# Patient Record
Sex: Female | Born: 1950
Health system: Southern US, Community
[De-identification: ages and names within clinical notes are randomized; demographics above are authoritative.]

## PROBLEM LIST (undated history)

## (undated) DIAGNOSIS — F32A Depression, unspecified: Secondary | ICD-10-CM

## (undated) DIAGNOSIS — E079 Disorder of thyroid, unspecified: Secondary | ICD-10-CM

## (undated) DIAGNOSIS — I2510 Atherosclerotic heart disease of native coronary artery without angina pectoris: Secondary | ICD-10-CM

## (undated) DIAGNOSIS — F329 Major depressive disorder, single episode, unspecified: Secondary | ICD-10-CM

## (undated) DIAGNOSIS — E039 Hypothyroidism, unspecified: Secondary | ICD-10-CM

## (undated) DIAGNOSIS — F419 Anxiety disorder, unspecified: Secondary | ICD-10-CM

## (undated) HISTORY — DX: Atherosclerotic heart disease of native coronary artery without angina pectoris: I25.10

## (undated) HISTORY — PX: BUNIONECTOMY: SHX129

## (undated) HISTORY — PX: BACK SURGERY: SHX140

---

## 2000-04-26 ENCOUNTER — Other Ambulatory Visit: Admission: RE | Admit: 2000-04-26 | Discharge: 2000-04-26 | Payer: Self-pay | Admitting: Gynecology

## 2000-06-06 ENCOUNTER — Ambulatory Visit (HOSPITAL_COMMUNITY): Admission: RE | Admit: 2000-06-06 | Discharge: 2000-06-06 | Payer: Self-pay | Admitting: Gynecology

## 2000-06-06 ENCOUNTER — Encounter (INDEPENDENT_AMBULATORY_CARE_PROVIDER_SITE_OTHER): Payer: Self-pay | Admitting: Specialist

## 2001-05-17 ENCOUNTER — Other Ambulatory Visit: Admission: RE | Admit: 2001-05-17 | Discharge: 2001-05-17 | Payer: Self-pay | Admitting: Gynecology

## 2001-12-08 ENCOUNTER — Encounter: Payer: Self-pay | Admitting: Neurosurgery

## 2001-12-08 ENCOUNTER — Ambulatory Visit (HOSPITAL_COMMUNITY): Admission: RE | Admit: 2001-12-08 | Discharge: 2001-12-09 | Payer: Self-pay | Admitting: Neurosurgery

## 2002-05-21 ENCOUNTER — Other Ambulatory Visit: Admission: RE | Admit: 2002-05-21 | Discharge: 2002-05-21 | Payer: Self-pay | Admitting: Gynecology

## 2003-05-24 ENCOUNTER — Other Ambulatory Visit: Admission: RE | Admit: 2003-05-24 | Discharge: 2003-05-24 | Payer: Self-pay | Admitting: Gynecology

## 2003-10-25 DIAGNOSIS — K5730 Diverticulosis of large intestine without perforation or abscess without bleeding: Secondary | ICD-10-CM | POA: Insufficient documentation

## 2004-06-02 ENCOUNTER — Other Ambulatory Visit: Admission: RE | Admit: 2004-06-02 | Discharge: 2004-06-02 | Payer: Self-pay | Admitting: Gynecology

## 2005-06-07 ENCOUNTER — Other Ambulatory Visit: Admission: RE | Admit: 2005-06-07 | Discharge: 2005-06-07 | Payer: Self-pay | Admitting: Gynecology

## 2006-06-09 ENCOUNTER — Other Ambulatory Visit: Admission: RE | Admit: 2006-06-09 | Discharge: 2006-06-09 | Payer: Self-pay | Admitting: Gynecology

## 2007-05-05 ENCOUNTER — Encounter: Admission: RE | Admit: 2007-05-05 | Discharge: 2007-05-05 | Payer: Self-pay | Admitting: Orthopedic Surgery

## 2007-06-23 ENCOUNTER — Other Ambulatory Visit: Admission: RE | Admit: 2007-06-23 | Discharge: 2007-06-23 | Payer: Self-pay | Admitting: Gynecology

## 2007-12-07 ENCOUNTER — Observation Stay (HOSPITAL_COMMUNITY): Admission: EM | Admit: 2007-12-07 | Discharge: 2007-12-08 | Payer: Self-pay | Admitting: Emergency Medicine

## 2007-12-20 ENCOUNTER — Ambulatory Visit: Payer: Self-pay | Admitting: Gastroenterology

## 2007-12-21 ENCOUNTER — Ambulatory Visit: Payer: Self-pay | Admitting: Gastroenterology

## 2007-12-21 ENCOUNTER — Encounter: Payer: Self-pay | Admitting: Gastroenterology

## 2007-12-21 DIAGNOSIS — K2990 Gastroduodenitis, unspecified, without bleeding: Secondary | ICD-10-CM

## 2007-12-21 DIAGNOSIS — K2970 Gastritis, unspecified, without bleeding: Secondary | ICD-10-CM | POA: Insufficient documentation

## 2007-12-22 ENCOUNTER — Ambulatory Visit (HOSPITAL_COMMUNITY): Payer: Self-pay | Admitting: Marriage and Family Therapist

## 2008-01-31 ENCOUNTER — Encounter: Payer: Self-pay | Admitting: Gastroenterology

## 2008-01-31 DIAGNOSIS — J45909 Unspecified asthma, uncomplicated: Secondary | ICD-10-CM | POA: Insufficient documentation

## 2008-01-31 DIAGNOSIS — E039 Hypothyroidism, unspecified: Secondary | ICD-10-CM | POA: Insufficient documentation

## 2008-01-31 DIAGNOSIS — M129 Arthropathy, unspecified: Secondary | ICD-10-CM | POA: Insufficient documentation

## 2008-01-31 DIAGNOSIS — F329 Major depressive disorder, single episode, unspecified: Secondary | ICD-10-CM | POA: Insufficient documentation

## 2008-03-28 ENCOUNTER — Telehealth: Payer: Self-pay | Admitting: Internal Medicine

## 2008-04-12 ENCOUNTER — Encounter: Admission: RE | Admit: 2008-04-12 | Discharge: 2008-04-12 | Payer: Self-pay | Admitting: Cardiology

## 2008-05-31 ENCOUNTER — Encounter: Admission: RE | Admit: 2008-05-31 | Discharge: 2008-08-29 | Payer: Self-pay | Admitting: Internal Medicine

## 2008-07-11 ENCOUNTER — Other Ambulatory Visit: Admission: RE | Admit: 2008-07-11 | Discharge: 2008-07-11 | Payer: Self-pay | Admitting: Gynecology

## 2008-07-11 ENCOUNTER — Ambulatory Visit: Payer: Self-pay | Admitting: Gynecology

## 2008-07-11 ENCOUNTER — Encounter: Payer: Self-pay | Admitting: Gynecology

## 2009-07-22 ENCOUNTER — Ambulatory Visit: Payer: Self-pay | Admitting: Gynecology

## 2009-07-22 ENCOUNTER — Other Ambulatory Visit: Admission: RE | Admit: 2009-07-22 | Discharge: 2009-07-22 | Payer: Self-pay | Admitting: Gynecology

## 2010-05-06 ENCOUNTER — Ambulatory Visit: Payer: Self-pay | Admitting: Gynecology

## 2010-08-05 ENCOUNTER — Emergency Department (HOSPITAL_COMMUNITY)
Admission: EM | Admit: 2010-08-05 | Discharge: 2010-08-05 | Payer: Self-pay | Source: Home / Self Care | Admitting: Emergency Medicine

## 2010-11-17 LAB — BASIC METABOLIC PANEL
BUN: 10 mg/dL (ref 6–23)
CO2: 25 mEq/L (ref 19–32)
Calcium: 9.2 mg/dL (ref 8.4–10.5)
Chloride: 107 mEq/L (ref 96–112)
Creatinine, Ser: 0.64 mg/dL (ref 0.4–1.2)
GFR calc Af Amer: >60 mL/min (ref >60)

## 2010-11-17 LAB — CBC
MCH: 32.5 pg (ref 26.0–34.0)
MCHC: 34.5 g/dL (ref 30.0–36.0)
MCV: 94.2 fL (ref 78.0–100.0)
Platelets: 254 10*3/uL (ref 150–400)
RBC: 4.62 MIL/uL (ref 3.87–5.11)
RDW: 13 % (ref 11.5–15.5)

## 2010-11-17 LAB — DIFFERENTIAL
Basophils Absolute: 0 10*3/uL (ref 0.0–0.1)
Basophils Relative: 0 % (ref 0–1)
Eosinophils Absolute: 0.1 10*3/uL (ref 0.0–0.7)
Eosinophils Relative: 1 % (ref 0–5)
Neutrophils Relative %: 57 % (ref 43–77)

## 2010-11-17 LAB — POCT CARDIAC MARKERS
Myoglobin, poc: 39.5 ng/mL (ref 12–200)
Troponin i, poc: <0.05 ng/mL (ref 0.00–0.09)

## 2011-01-19 NOTE — Consult Note (Signed)
NAMEJOVANI, COLQUHOUN               ACCOUNT NO.:  [id number]   MEDICAL RECORD NO.:  [id number]          PATIENT TYPE:  INP   LOCATION:  4733                         FACILITY:  [facility]   PHYSICIAN:  Georga Hacking, M.D.DATE OF BIRTH:  Feb 04, 1951   DATE OF CONSULTATION:  12/07/2007  DATE OF DISCHARGE:                                 CONSULTATION   Thank you for asking me to see this 60 year old female for evaluation of  chest discomfort.  The patient has a history of chronic low back pain  with sciatica and previous epidural steroid injections.  She has lost 30  pounds of weight in the past year and has been diagnosed with  hypothyroidism as well as osteoporosis.  She has what is considered to  be a reflex sympathetic dystrophy involving her feet.  She has been  undergoing memory suppression counseling and during a counseling session  yesterday, which was fairly intense, developed mid sternal discomfort.  The discomfort was described as tightness and heaviness and was  localized to the mid sternal area.  She got up afterwards and left and  had to sit in her car and later fell asleep in her car.  She eventually  was taken to Dr. Tiburcio Pea'  office where an EKG was normal and a GI  cocktail was administered.  At some point, she was eventually  administered nitroglycerin resulted in relief of the discomfort.  She  has complained of feeling very tired and had also felt as if her hands  and feet were cramping and her friend who is with her noted that her  speech did not appear to be her usual voice.  She since has had a normal  EKG and several sets of cardiac enzymes have returned normal.  She has  no previous history of anginal type pain and normally walks about three  miles per day.  Past history is negative for hypertension,  hyperlipidemia for GI problems.  She has a history of hypothyroidism,  osteoporosis.   PAST SURGICAL HISTORY:  1. Lumbar disk operation.  2. Bunionectomy with  previous fractures of foot.  3. Two C-sections.   ALLERGIES:  She has seasonal allergies but no medication allergies   CURRENT MEDICATIONS:  1. Synthroid 0.1375 mg daily.  2. Fosamax daily.   FAMILY HISTORY:  Mother is 74 and is healthy.  Father died at age 35 and  had a myocardial infarction.  One brother and two sisters are healthy.   SOCIAL HISTORY:  She is a Futures trader.  There is some situational stress  involving her husband.  She is a nonsmoker.  There is no history of  alcohol, cocaine or drug abuse.  She has been under significant  situational stress with some of her children.  She attends [employer].   REVIEW OF SYSTEMS:  Notes a 30-pound weight loss in the past year  attributed to her thyroid.  She is being checked for glaucoma but does  not have any eyedrops.  She has a history of sinus headaches and remote  history of migraine headaches.  She has a  remote history of asthma, but  has no shortness of breath, cough, wheezing or hemoptysis.  She has no  history of palpitations or syncope.  She has a remote history of  diverticulitis and takes a laxative everyday, Citrucel.  In addition,  she feels gassy and feels symptoms in her lower abdomen now.  She has a  remote history of urinary tract infections.  She has significant chronic  low back pain.  No history of stroke or TIA.  Other than as noted above,  the remainder of the review of systems is unremarkable.   PHYSICAL EXAMINATION:  GENERAL APPEARANCE:  She is a tall, thin female  who appeared depressed.  VITAL SIGNS:  Blood pressure is currently 110/80, pulse was 72.  SKIN:  Warm and dry.  HEENT:  EOMI.  C & S clear.  Fundi not examined.  Pharynx negative.  NECK:  Supple without masses, JVD, thyromegaly or bruits.  LUNGS:  Clear.  CARDIAC:  Normal S1 and S2, no S3 or murmur.  ABDOMEN:  Soft and nontender.  No edema.  EXTREMITIES:  Femoral and distal pulses 2+.   EKG is normal.   LABORATORY DATA:  Appeared  normal.   IMPRESSION:  1. Prolonged episode of chest discomfort yesterday with relief with      nitroglycerin with normal EKG and negative cardiac enzymes.  The      clinical context occurring in the setting of intense emotional      stress and counseling could suggest anxiety or stress as an      etiology.  2. Hypokalemia on lab.  There is no apparent etiology for this.  She      did note some recent diarrhea and this will need to be repleted.      This could be a cause of some of the cramping that she was feeling.  3. Hypothyroidism, under treatment.  4. Chronic low back pain.   RECOMMENDATIONS:  I think that she is okay to go home.  I would treat  her with over-the-counter Prilosec 20 mg and I would replete her  potassium prior to discharge.  She should rest over the weekend.  I have  asked her to call the office next week to have a treadmill test.  We  will also do a fasting lipid panel at the same time.      Georga Hacking, M.D.  Electronically Signed     WST/MEDQ  D:  12/08/2007  T:  12/08/2007  Job:  [id number]   cc:   Dario Guardian, M.D.  Michiel Cowboy, MD

## 2011-01-19 NOTE — H&P (Signed)
[name], [name]               ACCOUNT NO.:  [id number]   MEDICAL RECORD NO.:  [id number]          PATIENT TYPE:  EMS   LOCATION:  MAJO                         FACILITY:  [facility]   PHYSICIAN:  Hollice Espy, M.D.DATE OF BIRTH:  30-Jul-1951   DATE OF ADMISSION:  12/07/2007  DATE OF DISCHARGE:                              HISTORY & PHYSICAL   PRIMARY CARE PHYSICIAN:  Dr. Leonides Sake.   CHIEF COMPLAINT:  Chest pain.   HISTORY OF PRESENT ILLNESS:  The patient is a 60 year old white female  with past medical history of hypothyroidism who has been undergoing a  repressed memory counseling session for the last few weeks who today,  while during a session, started having sudden onset severe chest pain.  She describes her chest pain as a heavy pressure midsternal.  Also says  there is a bubble in her chest.  She felt like she could not catch her  breath.  She became quite concerned about this. The symptoms did not  resolve. She went to her car and closed her eyes for a little bit.  She  ended up falling asleep and when she woke up, her symptoms were  unchanged and she came the emergency room. Her lab work including EKG,  chest x-ray and cardiac markers were all unremarkable.  The patient's  symptoms continued to persist and it was felt best that she come into  the hospital for further evaluation.  Currently she is doing okay.  She  complains of continued is chest pressure and associated shortness of  breath.  She denies any headaches, vision changes or dysphagia.  No  palpitations.  No wheezing or coughing.  No abdominal pain.  No  hematuria, dysuria, constipation, diarrhea, focal extremity numbness,  weakness or pain.  Review of systems otherwise negative.   The patient's past medical history is hypothyroidism and osteoporosis.   MEDICATIONS:  She is on Synthroid, Fosamax.   ALLERGIES:  She has NO KNOWN DRUG ALLERGIES.   SOCIAL HISTORY:  Denies any tobacco, alcohol or drug  use.   FAMILY HISTORY:  Mom had heart attack at age 58.   PHYSICAL EXAMINATION:  VITAL SIGNS:  The patient's vitals on admission  showed temperature 98.2, heart rate 69, blood pressure 179/84 on  admission, now down to 131/79, respirations 22, oxygen saturation 100%  on room air.  Only medication the patient was given was aspirin as her  blood pressure came down on itz own.  GENERAL:  She is alert and oriented x3, in some mild distress secondary  to anxiety.  HEENT:  Normocephalic atraumatic.  Mucous membranes are moist.  She has  no carotid bruits.  HEART:  Regular rate and rhythm.  S1 and S2.  Note that when I listened  to her chest with my stethoscope, this seemed to reproduce some of the  pain in the midsternal chest.  LUNGS:  Clear to auscultation bilaterally.  ABDOMEN:  Soft, nontender, nondistended.  Positive bowel sounds.  EXTREMITIES:  No clubbing, cyanosis or edema.   LABORATORY DATA:  Her chest x-ray and EKG are both unremarkable.  White  count 5.9, hemoglobin 15.1, hematocrit 44, MCV 94, platelet count  288,000.  Sodium 141, potassium 3.7, chloride 105, bicarb not done.  BUN  14, creatinine 0.9, glucose 95.  CPK 55.1, MB 1.1, troponin-I 0.05.  of  0.05.  D-dimer which is pending.   ASSESSMENT/PLAN:  1. Chest pain, sudden onset, ongoing pressure/pain with associated      shortness of breath. I  suspect that this is anxiety.  However,      will check a D-dimer to rule out pulmonary embolus PE as well as      observe overnight.  Check two more sets of cardiac enzymes, p.r.n.      morphine, and Xanax.  2. Hypothyroidism.      Hollice Espy, M.D.  Electronically Signed     SKK/MEDQ  D:  12/07/2007  T:  12/08/2007  Job:  [id number]   cc:   Holley Bouche, M.D.

## 2011-01-19 NOTE — Assessment & Plan Note (Signed)
[facility]                         GASTROENTEROLOGY OFFICE NOTE   NAME:[name], [name]                      MRN:          [id number]  DATE:12/20/2007                            DOB:          12/11/1950    REASON FOR CONSULTATION:  Chest and abdominal pain.   HISTORY:  Ms. Dave is a 60 year old white female referred through the  courtesy of Dr. Elana Alm. Reade for evaluation.  On December 07, 2007, she  developed severe chest pain (10/10), while at rest that lasted for  several hours.  This prompted an emergency room and then a hospital  admission, where a cardiac etiology was ruled out.  She described the  pain as a bubble-like pressure in her chest.  She also was complaining  of upper epigastric pain.  She takes Naprosyn at least twice daily.  She  denies pyrosis or dysphagia.  She was placed empirically on Prilosec and  then Protonix.  Even with Protonix, she has had similar but much less  severe episodes of discomfort.  Chest pain does resolve with  nitroglycerin.  She denies cough, pyrosis or shortness of breath.   PAST MEDICAL/SURGICAL HISTORY:  1. Pertinent for asthma.  2. Arthritis.  3. Hypothyroidism.  4. Depression.  5. She is status post C-section.  6. Lumbar diskectomy.  7. Bunionectomy.   FAMILY HISTORY:  Pertinent for mother and sister with breast cancer.   MEDICATIONS:  1. Sudafed.  2. Actifed.  3. Synthroid.  4. Fosamax.  5. Protonix.   ALLERGIES:  No known drug allergies.   SOCIAL HISTORY:  She does not smoke.  She drinks rarely.  She is married  and works as a Comptroller.   REVIEW OF SYSTEMS:  Positive for joint pains, some dizziness, sleep  difficulties, back pain, fatigue.   PHYSICAL EXAMINATION:  GENERAL:  She is a healthy-appearing female.  VITAL SIGNS:  Pulse 84, blood pressure 92/58, weight 144 pounds.  HEENT: EOMI.  PERRLA.  Sclerae are anicteric.  Conjunctivae are pink.  NECK:  Supple without thyromegaly,  adenopathy or carotid bruits.  CHEST:  Clear to auscultation and percussion without adventitious  sounds.  CARDIAC:  Regular rhythm; normal S1 S2.  There are no murmurs, gallops  or rubs.  ABDOMEN:  Bowel sounds are normoactive.  Abdomen is soft, nontender and  nondistended.  There are no abdominal masses, tenderness, splenic  enlargement or hepatomegaly.  EXTREMITIES:  Full range of motion.  No cyanosis, clubbing or edema.  RECTAL:  Deferred.   IMPRESSION:  Chest and abdominal pain:  I suspect this is related to  pain emanating from her upper abdomen.  I am suspicious that Naprosyn  may be causing acid peptic disease.   RECOMMENDATION:  1. Continue Protonix.  2. Upper endoscopy.  3. Hold Naprosyn.     Barbette Hair. Arlyce Dice, MD,FACG  Electronically Signed    RDK/MedQ  DD: 12/20/2007  DT: 12/20/2007  Job #: [id number]   cc:   Molly Maduro A. Nicholos Johns, M.D.

## 2011-01-22 NOTE — Op Note (Signed)
[facility]  Patient:    [name], [name] Visit Number: [id number] MRN: [id number]          Service Type: DSU Location: 3000 [id number] Attending Physician:  Donn Pierini Dictated by:   Julio Sicks, M.D. Proc. Date: 12/08/01 Admit Date:  12/08/2001 Discharge Date: 12/09/2001                             Operative Report  PREOPERATIVE DIAGNOSIS:  Left L5-S1 herniated nucleus pulposus with radiculopathy.  POSTOPERATIVE DIAGNOSIS:  Left L5-S1 herniated nucleus pulposus with radiculopathy.  OPERATION PERFORMED:  Left L5-S1 laminotomy and microdiskectomy.  SURGEON:  Julio Sicks, M.D.  ASSISTANT:  Reinaldo Meeker, M.D.  ANESTHESIA:  General orotracheal.  INDICATIONS FOR PROCEDURE:  The patient is a 60 year old female with a history of back and left lower extremity pain, paresthesias and weakness consistent with left-sided S1 radiculopathy which has failed conservative management. MRI scan demonstrates a left-sided L5-S1 disk herniation and compression of the left-sided S1 nerve root.  The patient has been counseled at to her options.  She has decided to proceed with a left-sided L5-S1 laminotomy and microdiskectomy for hopeful improvement of her symptoms.  DESCRIPTION OF PROCEDURE:  The patient was taken to the operating room and placed on the operating table in supine position.  After an adequate level of anesthesia was achieved, the patient was positioned prone onto a Wilson frame and appropriately padded.  The patients lumbar region was shaved and prepped sterilely.  A 10 blade was used to make a linear skin incision overlying the L5-S1 interspace.  This was carried down sharply in the midline. Subperiosteal dissection was performed on the left side exposing the lamina and facet joints of L5 and S1.  The deep self-retaining retractor was placed. X-ray was taken and the level was confirmed.  A laminotomy was then performed using a high  speed drill and Kerrison rongeurs to remove the inferior one third of the lamina of L5, the medial edge of the L5-S1 facet joint and superior rim of the S1 lamina.  The ligamentum flavum was elevated and resected in a piecemeal fashion using Kerrison rongeurs.  The underlying thecal sac and exiting S1 nerve root were identified.  The microscope was brought into the field and used for microdissection of the left-sided S1 nerve root and underlying disk herniation.  Epidural venous plexus was coagulated and cut.  The thecal sac and S1 nerve root were gently mobilized and retracted toward the midline using DErrico nerve root retractor.  Disk herniation was readily apparent.  This was then incised with a 15 blade in rectangular fashion.  A wide disk space clean out was then achieved using pituitary rongeurs, upward angled pituitary rongeurs and Epstein curets.  All loose or obviously degenerated material was removed from the interspace.  All disk herniation was completely resected.  At this point there was no evidence of any further compression of the thecal sac or nerve roots.  There was no evidence of any injury to the thecal sac or nerve roots.  The wound was then irrigated with antibiotic solution.  Gelfoam was placed topically for hemostasis which was found to be good.  The microscope and retractor system were removed.  Hemostasis in the muscle achieved with electrocautery.  The wound was then closed in layers with Vicryl sutures.  Steri-Strips and sterile dressing were applied.  There were no apparent complications.  The  patient tolerated the procedure well and she returned to the recovery room postoperatively. Dictated by:   Julio Sicks, M.D. Attending Physician:  Donn Pierini DD:  12/08/01 TD:  12/08/01 Job: [id number]

## 2011-01-22 NOTE — Op Note (Signed)
[facility]  Patient:    [name], [name]                      MRN: [id number] Proc. Date: 06/06/00 Adm. Date:  [id number] Attending:  Merrily Pew                           Operative Report  PREOPERATIVE DIAGNOSIS:       Endometrial polyp.  POSTOPERATIVE DIAGNOSIS:      Endometrial polyp.  PROCEDURE:                    Hysteroscopic resection of endometrial polyp, endometrial curetting with curettage.  SURGEON:                      Timothy P. Fontaine, M.D.  ANESTHESIA:                   MAC.  ESTIMATED BLOOD LOSS:         Minimal.  COMPLICATIONS:                None.  SORBITOL DISCREPANCY:         Minimal.  FINDINGS:                     Sessile polyp, mid-posterior endometrial cavity. No other abnormalities, with hysteroscopy adequate, noting right and left tubal ostia, fundus, anterior and posterior uterine surfaces, lower uterine segment and endocervical canal.  DESCRIPTION OF PROCEDURE:     Patient underwent IV sedation, was placed in the low dorsal lithotomy position, received a perineovaginal preparation with Betadine scrub and Betadine solution by the nursing personnel and the bladder was emptied with an in-and-out Foley catheterization.  EUA was performed. Patient was draped in the usual fashion, cervix visualized, grasped with a single-tooth tenaculum and a paracervical block using 1% lidocaine, approximately 10 cc total, was placed.  The cervix was then gradually and gently dilated to admit the resectoscopic hysteroscope and hysteroscopy was performed, with the findings noted above.  Using the right-angle resectoscopic loop, the polyp was excised at its base without difficulty.  A subsequent sharp curettage was performed and re-hysteroscopy at the end of the procedure showed a normal-appearing empty cavity, good distention and no evidence of perforation.  Sorbitol discrepancy was minimal.  The instruments were  removed, the tenaculum removed, adequate hemostasis visualized, the patient placed in supine position, awakened without difficulty and taken to the recovery room in good condition, having tolerated the procedure well. DD:  06/06/00 TD:  06/06/00 Job: [id number]

## 2011-01-29 ENCOUNTER — Ambulatory Visit (INDEPENDENT_AMBULATORY_CARE_PROVIDER_SITE_OTHER): Payer: 59 | Admitting: Women's Health

## 2011-01-29 DIAGNOSIS — B373 Candidiasis of vulva and vagina: Secondary | ICD-10-CM

## 2011-01-29 DIAGNOSIS — N898 Other specified noninflammatory disorders of vagina: Secondary | ICD-10-CM

## 2011-06-01 LAB — BASIC METABOLIC PANEL
Chloride: 105
GFR calc non Af Amer: >60
Potassium: 3.2 — ABNORMAL LOW
Sodium: 137

## 2011-06-01 LAB — CBC
HCT: 39.6
HCT: 43.8
Hemoglobin: 13.8
Hemoglobin: 15.1 — ABNORMAL HIGH
MCV: 93.5
Platelets: 288
RBC: 4.25
RBC: 4.68
WBC: 4.4
WBC: 5.1

## 2011-06-01 LAB — DIFFERENTIAL
Eosinophils Relative: 1
Lymphocytes Relative: 34
Lymphs Abs: 1.7
Monocytes Relative: 10

## 2011-06-01 LAB — CARDIAC PANEL(CRET KIN+CKTOT+MB+TROPI)
CK, MB: 1.5
Relative Index: INVALID
Total CK: 68
Troponin I: 0.01

## 2011-06-01 LAB — POCT I-STAT, CHEM 8
BUN: 14
Hemoglobin: 15.6 — ABNORMAL HIGH
Potassium: 3.7
Sodium: 141
TCO2: 28

## 2011-06-01 LAB — POCT CARDIAC MARKERS
Myoglobin, poc: 55.1
Operator id: 146091
Troponin i, poc: <0.05

## 2011-06-01 LAB — D-DIMER, QUANTITATIVE: D-Dimer, Quant: <0.22

## 2011-10-26 ENCOUNTER — Encounter: Payer: Self-pay | Admitting: *Deleted

## 2011-10-26 NOTE — Progress Notes (Signed)
Patient ID: [name], female   DOB: 08-21-1951, 61 y.o.   MRN: [id number] Pt called wanting to know the name of medications that she had reaction to: per paper chart  Flagyl, cleocin, fosamax. Pt wrote the medications down.

## 2012-05-08 ENCOUNTER — Encounter (HOSPITAL_BASED_OUTPATIENT_CLINIC_OR_DEPARTMENT_OTHER): Payer: Self-pay | Admitting: *Deleted

## 2012-05-08 ENCOUNTER — Emergency Department (HOSPITAL_BASED_OUTPATIENT_CLINIC_OR_DEPARTMENT_OTHER)
Admission: EM | Admit: 2012-05-08 | Discharge: 2012-05-08 | Disposition: A | Discharge status: Home / Self Care | Payer: 59 | Attending: Emergency Medicine | Admitting: Emergency Medicine

## 2012-05-08 DIAGNOSIS — Y92009 Unspecified place in unspecified non-institutional (private) residence as the place of occurrence of the external cause: Secondary | ICD-10-CM | POA: Insufficient documentation

## 2012-05-08 DIAGNOSIS — Y998 Other external cause status: Secondary | ICD-10-CM | POA: Insufficient documentation

## 2012-05-08 DIAGNOSIS — S81809A Unspecified open wound, unspecified lower leg, initial encounter: Secondary | ICD-10-CM | POA: Insufficient documentation

## 2012-05-08 DIAGNOSIS — S81009A Unspecified open wound, unspecified knee, initial encounter: Secondary | ICD-10-CM | POA: Insufficient documentation

## 2012-05-08 DIAGNOSIS — E079 Disorder of thyroid, unspecified: Secondary | ICD-10-CM | POA: Insufficient documentation

## 2012-05-08 DIAGNOSIS — S81811A Laceration without foreign body, right lower leg, initial encounter: Secondary | ICD-10-CM

## 2012-05-08 DIAGNOSIS — Y93H2 Activity, gardening and landscaping: Secondary | ICD-10-CM | POA: Insufficient documentation

## 2012-05-08 DIAGNOSIS — W268XXA Contact with other sharp object(s), not elsewhere classified, initial encounter: Secondary | ICD-10-CM | POA: Insufficient documentation

## 2012-05-08 HISTORY — DX: Disorder of thyroid, unspecified: E07.9

## 2012-05-08 MED ORDER — LIDOCAINE HCL 2 % IJ SOLN
20.0000 mL | Freq: Once | INTRAMUSCULAR | Status: AC
  Administered 2012-05-08: 20 mg

## 2012-05-08 MED ORDER — TETANUS-DIPHTH-ACELL PERTUSSIS 5-2.5-18.5 LF-MCG/0.5 IM SUSP
INTRAMUSCULAR | Status: AC
  Administered 2012-05-08: 0.5 mL via INTRAMUSCULAR
  Filled 2012-05-08: qty 0.5

## 2012-05-08 MED ORDER — LIDOCAINE HCL 2 % IJ SOLN
INTRAMUSCULAR | Status: AC
  Administered 2012-05-08: 20 mg
  Filled 2012-05-08: qty 1

## 2012-05-08 MED ORDER — HYDROCODONE-ACETAMINOPHEN 5-325 MG PO TABS
1.0000 | ORAL_TABLET | Freq: Once | ORAL | Status: AC
  Administered 2012-05-08: 1 via ORAL
  Filled 2012-05-08: qty 1

## 2012-05-08 MED ORDER — TETANUS-DIPHTH-ACELL PERTUSSIS 5-2.5-18.5 LF-MCG/0.5 IM SUSP
0.5000 mL | Freq: Once | INTRAMUSCULAR | Status: AC
  Administered 2012-05-08: 0.5 mL via INTRAMUSCULAR

## 2012-05-08 MED ORDER — HYDROCODONE-ACETAMINOPHEN 5-325 MG PO TABS: 1.0000 | ORAL_TABLET | ORAL | Status: AC | PRN

## 2012-05-08 NOTE — ED Provider Notes (Signed)
History     CSN: [id number]  Arrival date & time 05/08/12  1622   First MD Initiated Contact with Patient 05/08/12 1720      Chief Complaint  Patient presents with  . Laceration    (Consider location/radiation/quality/duration/timing/severity/associated sxs/prior treatment) Patient is a 61 y.o. female presenting with skin laceration. The history is provided by the patient.  Laceration  The incident occurred 3 to 5 hours ago. The laceration is located on the right leg. The laceration mechanism was a a blunt object (She ran into a stump while gardening causing laceration to right lower leg.). Her tetanus status is out of date.    Past Medical History  Diagnosis Date  . Thyroid disease     Past Surgical History  Procedure Date  . Cesarean section     No family history on file.  History  Substance Use Topics  . Smoking status: Never Smoker   . Smokeless tobacco: Not on file  . Alcohol Use: Yes    OB History    Grav Para Term Preterm Abortions TAB SAB Ect Mult Living                  Review of Systems  Constitutional: Negative for fever.  Musculoskeletal: Negative for myalgias.  Skin: Positive for wound.  Neurological: Negative for numbness.    Allergies  Flagyl  Home Medications   Current Outpatient Rx  Name Route Sig Dispense Refill  . COQ-10 PO Oral Take 1 tablet by mouth daily.    Marland Kitchen FLUTICASONE PROPIONATE 50 MCG/ACT NA SUSP Nasal Place 2 sprays into the nose daily.    Marland Kitchen LEVOTHYROXINE SODIUM 112 MCG PO TABS Oral Take 112 mcg by mouth daily.    Marland Kitchen ONE-DAILY MULTI VITAMINS PO TABS Oral Take 1 tablet by mouth daily.    Marland Kitchen FISH OIL PO Oral Take 1 capsule by mouth daily.    Marland Kitchen PSEUDOEPHEDRINE HCL 30 MG PO TABS Oral Take 30 mg by mouth every 4 (four) hours as needed. For allegies      BP 130/66  Pulse 84  Temp 97.5 F (36.4 C) (Oral)  Resp 20  SpO2 99%  Physical Exam  Constitutional: She is oriented to person, place, and time. She appears well-developed  and well-nourished. No distress.  Musculoskeletal:       Full range of motion of right lower leg. Pulses distally intact.  Neurological: She is alert and oriented to person, place, and time.  Skin:       Round wound with flap type laceration to right lower anteromedial leg. Bleeding controlled. No swelling.     ED Course  Procedures (including critical care time)  Labs Reviewed - No data to display No results found.  LACERATION REPAIR Performed by: Langley Adie A Authorized by: Langley Adie A Consent: Verbal consent obtained. Risks and benefits: risks, benefits and alternatives were discussed Consent given by: patient Patient identity confirmed: provided demographic data Prepped and Draped in normal sterile fashion Wound explored  Laceration Location: right lower leg  Laceration Length: 3cm  No Foreign Bodies seen or palpated  Anesthesia: local infiltration  Local anesthetic: lidocaine 1% w/o epinephrine  Anesthetic total: 1 ml  Irrigation method: syringe Amount of cleaning: standard  Skin closure: 4-0 prolene  Number of sutures: 4  Technique: simple interrupted The flap of skin very thin in places and complete closure was not possible. Deepest portion of wound covered. Topical abx applied and leg was bandaged.  Patient tolerance: Patient  tolerated the procedure well with no immediate complications.  No diagnosis found.  1. Right leg laceration   MDM  Uncomplicated Laceration repair.        Rodena Medin, PA-C 05/08/12 1902

## 2012-05-08 NOTE — ED Provider Notes (Signed)
Medical screening examination/treatment/procedure(s) were performed by non-physician practitioner and as supervising physician I was immediately available for consultation/collaboration.   Carleene Cooper III, MD 05/08/12 [phone]

## 2012-05-08 NOTE — ED Notes (Signed)
Laceration to her right lower leg.

## 2012-05-31 ENCOUNTER — Encounter: Payer: Self-pay | Admitting: Gynecology

## 2013-08-14 ENCOUNTER — Observation Stay (HOSPITAL_COMMUNITY)
Admission: EM | Admit: 2013-08-14 | Discharge: 2013-08-16 | Disposition: A | Discharge status: Home / Self Care | Payer: 59 | Attending: Internal Medicine | Admitting: Internal Medicine

## 2013-08-14 ENCOUNTER — Emergency Department (HOSPITAL_COMMUNITY): Payer: 59

## 2013-08-14 ENCOUNTER — Encounter (HOSPITAL_COMMUNITY): Payer: Self-pay | Admitting: Emergency Medicine

## 2013-08-14 DIAGNOSIS — R209 Unspecified disturbances of skin sensation: Secondary | ICD-10-CM | POA: Insufficient documentation

## 2013-08-14 DIAGNOSIS — E039 Hypothyroidism, unspecified: Secondary | ICD-10-CM | POA: Insufficient documentation

## 2013-08-14 DIAGNOSIS — F411 Generalized anxiety disorder: Secondary | ICD-10-CM | POA: Insufficient documentation

## 2013-08-14 DIAGNOSIS — F329 Major depressive disorder, single episode, unspecified: Secondary | ICD-10-CM | POA: Insufficient documentation

## 2013-08-14 DIAGNOSIS — M129 Arthropathy, unspecified: Secondary | ICD-10-CM

## 2013-08-14 DIAGNOSIS — Z881 Allergy status to other antibiotic agents status: Secondary | ICD-10-CM | POA: Insufficient documentation

## 2013-08-14 DIAGNOSIS — M546 Pain in thoracic spine: Principal | ICD-10-CM | POA: Insufficient documentation

## 2013-08-14 DIAGNOSIS — M549 Dorsalgia, unspecified: Secondary | ICD-10-CM | POA: Diagnosis present

## 2013-08-14 DIAGNOSIS — F3289 Other specified depressive episodes: Secondary | ICD-10-CM | POA: Insufficient documentation

## 2013-08-14 DIAGNOSIS — IMO0001 Reserved for inherently not codable concepts without codable children: Secondary | ICD-10-CM | POA: Diagnosis present

## 2013-08-14 DIAGNOSIS — Z79899 Other long term (current) drug therapy: Secondary | ICD-10-CM | POA: Insufficient documentation

## 2013-08-14 HISTORY — DX: Hypothyroidism, unspecified: E03.9

## 2013-08-14 HISTORY — DX: Anxiety disorder, unspecified: F41.9

## 2013-08-14 HISTORY — DX: Major depressive disorder, single episode, unspecified: F32.9

## 2013-08-14 HISTORY — DX: Depression, unspecified: F32.A

## 2013-08-14 LAB — COMPREHENSIVE METABOLIC PANEL
ALT: 17 U/L (ref 0–35)
AST: 21 U/L (ref 0–37)
Alkaline Phosphatase: 86 U/L (ref 39–117)
BUN: 11 mg/dL (ref 6–23)
Chloride: 102 mEq/L (ref 96–112)
GFR calc Af Amer: >90 mL/min (ref >90)
Glucose, Bld: 87 mg/dL (ref 70–99)
Potassium: 3.8 mEq/L (ref 3.5–5.1)
Sodium: 138 mEq/L (ref 135–145)
Total Protein: 6.9 g/dL (ref 6.0–8.3)

## 2013-08-14 LAB — CBC
Hemoglobin: 14.6 g/dL (ref 12.0–15.0)
MCHC: 34 g/dL (ref 30.0–36.0)
RBC: 4.57 MIL/uL (ref 3.87–5.11)
WBC: 5.2 10*3/uL (ref 4.0–10.5)

## 2013-08-14 LAB — POCT I-STAT TROPONIN I: Troponin i, poc: 0.04 ng/mL (ref 0.00–0.08)

## 2013-08-14 MED ORDER — FENTANYL CITRATE 0.05 MG/ML IJ SOLN
50.0000 ug | Freq: Once | INTRAMUSCULAR | Status: AC
  Administered 2013-08-14: 50 ug via INTRAVENOUS
  Filled 2013-08-14: qty 2

## 2013-08-14 MED ORDER — METOCLOPRAMIDE HCL 5 MG/ML IJ SOLN
10.0000 mg | Freq: Once | INTRAMUSCULAR | Status: AC
  Administered 2013-08-14: 10 mg via INTRAVENOUS
  Filled 2013-08-14: qty 2

## 2013-08-14 MED ORDER — HYDROMORPHONE HCL PF 1 MG/ML IJ SOLN
INTRAMUSCULAR | Status: AC
  Filled 2013-08-14: qty 1

## 2013-08-14 MED ORDER — ONDANSETRON HCL 4 MG/2ML IJ SOLN
INTRAMUSCULAR | Status: AC
  Filled 2013-08-14: qty 2

## 2013-08-14 MED ORDER — HYDROMORPHONE HCL PF 1 MG/ML IJ SOLN
1.0000 mg | Freq: Once | INTRAMUSCULAR | Status: AC
  Administered 2013-08-14: 1 mg via INTRAVENOUS
  Filled 2013-08-14: qty 1

## 2013-08-14 MED ORDER — SODIUM CHLORIDE 0.9 % IV SOLN
Freq: Once | INTRAVENOUS | Status: AC
  Administered 2013-08-14: 22:00:00 via INTRAVENOUS

## 2013-08-14 MED ORDER — DIAZEPAM 5 MG/ML IJ SOLN
5.0000 mg | Freq: Once | INTRAMUSCULAR | Status: AC
  Administered 2013-08-14: 5 mg via INTRAVENOUS
  Filled 2013-08-14: qty 2

## 2013-08-14 MED ORDER — ONDANSETRON HCL 4 MG/2ML IJ SOLN
4.0000 mg | Freq: Once | INTRAMUSCULAR | Status: AC
  Administered 2013-08-14: 4 mg via INTRAVENOUS
  Filled 2013-08-14: qty 2

## 2013-08-14 NOTE — ED Notes (Addendum)
Family at bedside, continues to come to nurses station asking for meds for pt.  Family stated she was told someone would bring an ice pack for pt's back.  Lg ice pack given to family for pt.

## 2013-08-14 NOTE — ED Notes (Addendum)
Back pain that stared yesterday and then progressed to her chest states got nauseated and did not sleep thinks back pain made her upset has taken a xanax last night states when she gets upset she disassociates pt hyperventilating at triage  Pain in back is a spasm pain she states

## 2013-08-14 NOTE — ED Notes (Signed)
Pt c/o lower back pain that started yesterday. Pt states she did not fall or having any injuries that caused the pain. PT states pressure makes pain better. Pt has not taken any medications for pain relief but she has tried ice and heat. Pt rates pain 10/10.

## 2013-08-14 NOTE — ED Notes (Addendum)
Pt states she has hx of lumbar surgery. Pt also states pain radiates from back into her chest. Describes pain as sharp.

## 2013-08-14 NOTE — ED Provider Notes (Signed)
CSN: [id number]     Arrival date & time 08/14/13  1332 History   First MD Initiated Contact with Patient 08/14/13 1703     Chief Complaint  Patient presents with  . Chest Pain  . Back Pain   (Consider location/radiation/quality/duration/timing/severity/associated sxs/prior Treatment) HPI  Chief complaint: back pain  Patient is a 62 year old female no significant past medical history comes in today with chief complaint of back pain. Patient states she's had back pain for approximately 24 hours. It is located in the thoracic spine between shoulder blades. She rates this is a 10 out of 10 pain. It is been constant for 24 hours. Worsening. Now 10 out of 10. Describes this as a sharp pain which is waxing and waning. Radiation of pain from paraspinous area to the left side back and shoulder. No aggravating or alleviating factors noted. Patient has taken Tylenol, naproxen, ibuprofen, Xanax for this with no relief of symptoms. Patient also endorses some numbness and tingling in extremities. This appears to be worse on left side. Patient states this is worse when she gets worked up and breathe fast.  Past Medical History  Diagnosis Date  . Thyroid disease   . Anxiety   . Depression    Past Surgical History  Procedure Laterality Date  . Cesarean section     No family history on file. History  Substance Use Topics  . Smoking status: Never Smoker   . Smokeless tobacco: Not on file  . Alcohol Use: Yes   OB History   Grav Para Term Preterm Abortions TAB SAB Ect Mult Living                 Review of Systems  Constitutional: Negative for fatigue.  Eyes: Negative for visual disturbance.  Respiratory: Negative for shortness of breath.   Cardiovascular: Negative for chest pain.  Gastrointestinal: Negative for abdominal pain.  Genitourinary: Negative for dysuria.  Musculoskeletal: Positive for back pain.  Skin: Negative for rash.  Neurological: Positive for numbness (tingling with  hyperventilation pain). Negative for headaches.  Psychiatric/Behavioral: Negative for confusion.  All other systems reviewed and are negative.    Allergies  Flagyl  Home Medications   Current Outpatient Rx  Name  Route  Sig  Dispense  Refill  . acetaminophen (TYLENOL) 325 MG tablet   Oral   Take 650 mg by mouth every 6 (six) hours as needed for mild pain.         Marland Kitchen ALPRAZolam (XANAX) 1 MG tablet   Oral   Take 1 mg by mouth at bedtime as needed for anxiety.         . diphenhydrAMINE (BENADRYL) 25 MG tablet   Oral   Take 25-50 mg by mouth every 6 (six) hours as needed (sinus infection).         Marland Kitchen levothyroxine (SYNTHROID, LEVOTHROID) 112 MCG tablet   Oral   Take 112 mcg by mouth daily.         . Multiple Vitamin (MULTIVITAMIN) tablet   Oral   Take 1 tablet by mouth daily.         . Naproxen (NAPROSYN PO)   Oral   Take 2 tablets by mouth daily as needed (pain).         . pseudoephedrine (SUDAFED) 30 MG tablet   Oral   Take 30 mg by mouth every 4 (four) hours as needed. For allegies          BP 115/70  Pulse 75  Temp(Src) 97.6 F (36.4 C) (Oral)  Resp 12  SpO2 98% Physical Exam  Nursing note and vitals reviewed. Constitutional: She is oriented to person, place, and time. She appears well-developed and well-nourished.  HENT:  Head: Normocephalic.  Eyes: Pupils are equal, round, and reactive to light.  Neck: Normal range of motion.  Cardiovascular: Normal rate and regular rhythm.   No murmur heard. Pulmonary/Chest: Effort normal and breath sounds normal. No respiratory distress.  Abdominal: Soft. She exhibits no distension. There is no tenderness. There is no rebound and no guarding.  Musculoskeletal: Normal range of motion. She exhibits no edema.  Patient with subjective pain midthoracic spine radiation to left. Is not reproducible with palpation of spinous or paraspinous areas. No step-offs deformities or traumatic injuries.  Neurological: She is  alert and oriented to person, place, and time. She displays normal reflexes. No cranial nerve deficit. She exhibits normal muscle tone. Coordination normal.  Patient with subjective tingling throughout upper and lower extremity is worse on left. Patient states this is worse with hyperventilation pain. On exam patient has intact sensation to soft, pinprick, for resection, temp throughout. Motor exam CT limited by pain but does have full range of movement throughout.  Skin: Skin is warm. No rash noted.  Psychiatric: She has a normal mood and affect. Her behavior is normal. Judgment and thought content normal.    ED Course  Procedures (including critical care time) Labs Review Labs Reviewed  CBC  COMPREHENSIVE METABOLIC PANEL  POCT I-STAT TROPONIN I   Imaging Review Dg Chest 2 View  08/14/2013   CLINICAL DATA:  Chest pain.  EXAM: CHEST  2 VIEW  COMPARISON:  Chest radiograph 09/05/2010.  FINDINGS: Normal cardiac and mediastinal contours. No consolidative pulmonary opacities. No pleural effusion or pneumothorax. Regional skeleton is unremarkable. Nodular densities are demonstrated projecting over the bilateral lower lungs.  IMPRESSION: No acute cardiopulmonary process.  Nodular densities projecting over the bilateral lower lungs, likely representing nipple shadows recommend repeat evaluation with nipple markers in place.   Electronically Signed   By: Annia Belt M.D.   On: 08/14/2013 14:48   Ct Thoracic Spine Wo Contrast  08/14/2013   CLINICAL DATA:  Back pain.  EXAM: CT THORACIC AND LUMBAR SPINE WITHOUT CONTRAST  TECHNIQUE: Multidetector CT imaging of the thoracic and lumbar spine was performed without contrast. Multiplanar CT image reconstructions were also generated.  COMPARISON:  None.  FINDINGS: CT THORACIC SPINE FINDINGS  Vertebral body height and alignment are normal. There is some mild anterior endplate spurring in the mid and lower thoracic spine. The central canal appears open by CT. No  notable facet arthropathy is seen. No focal bony lesion is identified. Imaged lung parenchyma demonstrates lung mild dependent atelectasis. No pleural effusion is noted.  CT LUMBAR SPINE FINDINGS  Vertebral body height and alignment are normal. No pars interarticularis defect is identified. Mild disc bulging and endplate spurring are seen at L4-5 and L5-S1. The central canal appears open at each level with mild left foraminal narrowing identified at L4-5 and bilateral foraminal narrowing at L5-S1. Vacuum disc phenomena at L5-S1 is noted. Imaged intra-abdominal contents demonstrate a right renal cyst.  IMPRESSION: CT THORACIC SPINE IMPRESSION  Mild degenerative disease.  No focal abnormality.  CT LUMBAR SPINE IMPRESSION  Mild degenerative change lower lumbar spine without central canal stenosis. There is some foraminal narrowing on the left at L4-5 and bilaterally at L5-S1 without overt nerve root compression.   Electronically Signed   By: Maisie Fus  Dalessio M.D.   On: 08/14/2013 18:55   Ct Lumbar Spine Wo Contrast  08/14/2013   CLINICAL DATA:  Back pain.  EXAM: CT THORACIC AND LUMBAR SPINE WITHOUT CONTRAST  TECHNIQUE: Multidetector CT imaging of the thoracic and lumbar spine was performed without contrast. Multiplanar CT image reconstructions were also generated.  COMPARISON:  None.  FINDINGS: CT THORACIC SPINE FINDINGS  Vertebral body height and alignment are normal. There is some mild anterior endplate spurring in the mid and lower thoracic spine. The central canal appears open by CT. No notable facet arthropathy is seen. No focal bony lesion is identified. Imaged lung parenchyma demonstrates lung mild dependent atelectasis. No pleural effusion is noted.  CT LUMBAR SPINE FINDINGS  Vertebral body height and alignment are normal. No pars interarticularis defect is identified. Mild disc bulging and endplate spurring are seen at L4-5 and L5-S1. The central canal appears open at each level with mild left foraminal  narrowing identified at L4-5 and bilateral foraminal narrowing at L5-S1. Vacuum disc phenomena at L5-S1 is noted. Imaged intra-abdominal contents demonstrate a right renal cyst.  IMPRESSION: CT THORACIC SPINE IMPRESSION  Mild degenerative disease.  No focal abnormality.  CT LUMBAR SPINE IMPRESSION  Mild degenerative change lower lumbar spine without central canal stenosis. There is some foraminal narrowing on the left at L4-5 and bilaterally at L5-S1 without overt nerve root compression.   Electronically Signed   By: Drusilla Kanner M.D.   On: 08/14/2013 18:55    EKG Interpretation   None       MDM   1. Back pain     Afebrile vital signs stable on arrival. Patient with significant pain thoracic spine. No definite neuro deficits. However motor exam limited secondary to pain. Patient has subjective weakness in extremities, however no sensory or coordination dysfunction. Suspect subjective weakness likely secondary to pain. Patient with no visible deformities, step-offs or other traumatic findings. Initial chest x-ray within normal limits. Labs unremarkable. Patient with no history of IV drug abuse. No saddle anesthesia. No hard neuro findings. However secondary patient's significant pain on exam CT thoracic and lumbar spines were obtained. No organic cause of pain is identified. Patient was treated in emergency department with Valium as well as narcotics with only mild relief. Patient also had multiple episodes of emesis secondary to pain. Determined patient will need MRI to rule out any nerve compression, entrapment or disc pathology. Patient also need intense pain control this time as well as antiemetics. Patient was counseled to the medicine service and admitted for observation. Triage note states chest pain- pt denies this multiple times. Back pain only.   Bridgett Larsson, MD 08/14/13 [id number]

## 2013-08-15 ENCOUNTER — Inpatient Hospital Stay (HOSPITAL_COMMUNITY): Payer: 59

## 2013-08-15 ENCOUNTER — Encounter (HOSPITAL_COMMUNITY): Payer: Self-pay | Admitting: Anesthesiology

## 2013-08-15 DIAGNOSIS — R030 Elevated blood-pressure reading, without diagnosis of hypertension: Secondary | ICD-10-CM

## 2013-08-15 DIAGNOSIS — E039 Hypothyroidism, unspecified: Secondary | ICD-10-CM

## 2013-08-15 DIAGNOSIS — IMO0001 Reserved for inherently not codable concepts without codable children: Secondary | ICD-10-CM | POA: Diagnosis present

## 2013-08-15 DIAGNOSIS — M549 Dorsalgia, unspecified: Secondary | ICD-10-CM

## 2013-08-15 LAB — COMPREHENSIVE METABOLIC PANEL
AST: 19 U/L (ref 0–37)
Albumin: 3.2 g/dL — ABNORMAL LOW (ref 3.5–5.2)
Alkaline Phosphatase: 73 U/L (ref 39–117)
Calcium: 8.1 mg/dL — ABNORMAL LOW (ref 8.4–10.5)
Creatinine, Ser: 0.47 mg/dL — ABNORMAL LOW (ref 0.50–1.10)
GFR calc non Af Amer: >90 mL/min (ref >90)
Total Protein: 5.9 g/dL — ABNORMAL LOW (ref 6.0–8.3)

## 2013-08-15 LAB — CBC WITH DIFFERENTIAL/PLATELET
Eosinophils Relative: 1 % (ref 0–5)
HCT: 37.4 % (ref 36.0–46.0)
Lymphocytes Relative: 18 % (ref 12–46)
Lymphs Abs: 1.1 10*3/uL (ref 0.7–4.0)
MCHC: 34.2 g/dL (ref 30.0–36.0)
MCV: 93.3 fL (ref 78.0–100.0)
Platelets: 269 10*3/uL (ref 150–400)
RBC: 4.01 MIL/uL (ref 3.87–5.11)
WBC: 6 10*3/uL (ref 4.0–10.5)

## 2013-08-15 LAB — TSH: TSH: 4.698 u[IU]/mL — ABNORMAL HIGH (ref 0.350–4.500)

## 2013-08-15 MED ORDER — ALPRAZOLAM 0.5 MG PO TABS
1.0000 mg | ORAL_TABLET | Freq: Every evening | ORAL | Status: DC | PRN
Start: 2013-08-15 — End: 2013-08-16

## 2013-08-15 MED ORDER — ONDANSETRON HCL 4 MG/2ML IJ SOLN: 4.0000 mg | Freq: Four times a day (QID) | INTRAMUSCULAR | Status: DC | PRN

## 2013-08-15 MED ORDER — LORAZEPAM 1 MG PO TABS
1.0000 mg | ORAL_TABLET | Freq: Once | ORAL | Status: AC
  Administered 2013-08-15: 1 mg via ORAL
  Filled 2013-08-15: qty 1

## 2013-08-15 MED ORDER — PANTOPRAZOLE SODIUM 20 MG PO TBEC
20.0000 mg | DELAYED_RELEASE_TABLET | Freq: Every day | ORAL | Status: DC
  Administered 2013-08-15: 20 mg via ORAL
  Filled 2013-08-15 (×2): qty 1

## 2013-08-15 MED ORDER — ACETAMINOPHEN 325 MG PO TABS: 650.0000 mg | ORAL_TABLET | Freq: Four times a day (QID) | ORAL | Status: DC | PRN

## 2013-08-15 MED ORDER — HYDROMORPHONE HCL PF 1 MG/ML IJ SOLN: 0.5000 mg | INTRAMUSCULAR | Status: DC | PRN

## 2013-08-15 MED ORDER — ADULT MULTIVITAMIN W/MINERALS CH
1.0000 | ORAL_TABLET | Freq: Every day | ORAL | Status: DC
  Administered 2013-08-15: 1 via ORAL
  Filled 2013-08-15 (×2): qty 1

## 2013-08-15 MED ORDER — ONE-DAILY MULTI VITAMINS PO TABS: 1.0000 | ORAL_TABLET | Freq: Every day | ORAL | Status: DC

## 2013-08-15 MED ORDER — LORAZEPAM 1 MG PO TABS
1.0000 mg | ORAL_TABLET | ORAL | Status: AC
  Administered 2013-08-16: 1 mg via ORAL
  Filled 2013-08-15: qty 1

## 2013-08-15 MED ORDER — HYDROCODONE-ACETAMINOPHEN 5-325 MG PO TABS
1.0000 | ORAL_TABLET | Freq: Four times a day (QID) | ORAL | Status: DC | PRN
  Administered 2013-08-15 – 2013-08-16 (×3): 1 via ORAL
  Filled 2013-08-15 (×3): qty 1

## 2013-08-15 MED ORDER — SODIUM CHLORIDE 0.9 % IV SOLN: INTRAVENOUS | Status: DC

## 2013-08-15 MED ORDER — LEVOTHYROXINE SODIUM 112 MCG PO TABS
112.0000 ug | ORAL_TABLET | Freq: Every day | ORAL | Status: DC
  Administered 2013-08-15 – 2013-08-16 (×2): 112 ug via ORAL
  Filled 2013-08-15 (×4): qty 1

## 2013-08-15 MED ORDER — KETOROLAC TROMETHAMINE 15 MG/ML IJ SOLN
15.0000 mg | Freq: Once | INTRAMUSCULAR | Status: AC
  Administered 2013-08-15: 15 mg via INTRAVENOUS
  Filled 2013-08-15: qty 1

## 2013-08-15 MED ORDER — ACETAMINOPHEN 650 MG RE SUPP: 650.0000 mg | Freq: Four times a day (QID) | RECTAL | Status: DC | PRN

## 2013-08-15 MED ORDER — ONDANSETRON HCL 4 MG PO TABS: 4.0000 mg | ORAL_TABLET | Freq: Four times a day (QID) | ORAL | Status: DC | PRN

## 2013-08-15 MED ORDER — SALINE SPRAY 0.65 % NA SOLN
1.0000 | NASAL | Status: DC | PRN
  Administered 2013-08-15: 1 via NASAL
  Filled 2013-08-15: qty 44

## 2013-08-15 NOTE — Progress Notes (Signed)
Patient started to have back pain again. Radiating to left side chest, breast. She had an episode of tingling in her hands. This resolved.   1-Back pain; MRI pending. Vicodin order for pain control. Will give one time dose of IV Toradol.

## 2013-08-15 NOTE — ED Provider Notes (Signed)
This patient was seen in conjunction with the Resident Physician, Dr. Arlie Solomons. The documentation is accurate, and reflective of the encounter.  On my exam, she was very uncomfortable appearing.  However, she was neurovascularly intact, hemodynamically stable.  Patient's labs, initial evaluation were largely reassuring, though with persistency of her pain, inability to function, she required admission for further evaluation and management.   Gerhard Munch, MD 08/15/13 0030

## 2013-08-15 NOTE — H&P (Signed)
[facility] History and Physical  [name] ZOX:[id number] DOB: 1951/01/05 DOA: 08/14/2013  Referring physician: ER physician. PCP: No primary provider on file.   Chief Complaint: Back pain.  HPI: [name] is a 62 y.o. female with history of hypothyroidism started to experience mid back pain since Monday afternoon. Patient denies any fall or trauma. The pain has been stabbing in nature persistent increases on minimal movements. Pain radiates to the front of the chest. In the ER troponins and EKG were unremarkable. Patient had CT of the T-spine and L-spine which only showed some degenerative changes and foraminal narrowing in the L4-L5. Since patient's pain is persistent has been admitted for further workup including MRI. Patient denies any incontinence of urine or bowels. Denies any weakness of the upper lower extremities. Patient did have some nausea and vomiting after receiving pain medications has no abdominal pain diarrhea. Patient denies any shortness of breath other than the radiating pain to the chest patient has no chest pain.   Review of Systems: As presented in the history of presenting illness, rest negative.  Past Medical History  Diagnosis Date  . Thyroid disease   . Anxiety   . Depression   . Hypothyroidism    Past Surgical History  Procedure Laterality Date  . Cesarean section    . Back surgery    . Bunionectomy Right    Social History:  reports that she has never smoked. She does not have any smokeless tobacco history on file. She reports that she drinks alcohol. She reports that she does not use illicit drugs. Where does patient live home. Can patient participate in ADLs? Yes.  Allergies  Allergen Reactions  . Flagyl [Metronidazole] Anaphylaxis    Family History:  Family History  Problem Relation Age of Onset  . CAD Father   . Stroke Brother       Prior to Admission medications   Medication Sig Start Date End Date Taking? Authorizing  Provider  acetaminophen (TYLENOL) 325 MG tablet Take 650 mg by mouth every 6 (six) hours as needed for mild pain.   Yes Historical Provider, MD  ALPRAZolam Prudy Feeler) 1 MG tablet Take 1 mg by mouth at bedtime as needed for anxiety.   Yes Historical Provider, MD  diphenhydrAMINE (BENADRYL) 25 MG tablet Take 25-50 mg by mouth every 6 (six) hours as needed (sinus infection).   Yes Historical Provider, MD  levothyroxine (SYNTHROID, LEVOTHROID) 112 MCG tablet Take 112 mcg by mouth daily.   Yes Historical Provider, MD  Multiple Vitamin (MULTIVITAMIN) tablet Take 1 tablet by mouth daily.   Yes Historical Provider, MD  Naproxen (NAPROSYN PO) Take 2 tablets by mouth daily as needed (pain).   Yes Historical Provider, MD  pseudoephedrine (SUDAFED) 30 MG tablet Take 30 mg by mouth every 4 (four) hours as needed. For allegies   Yes Historical Provider, MD    Physical Exam: Filed Vitals:   08/14/13 2230 08/14/13 2300 08/14/13 2330 08/15/13 0008  BP: 109/69 115/70 104/60 164/91  Pulse: 68 75 62 62  Temp:    97.6 F (36.4 C)  TempSrc:    Oral  Resp: 18 12 12 16   Height:    5\' 8"  (1.727 m)  Weight:    67.359 kg (148 lb 8 oz)  SpO2: 98% 98% 98% 95%     General:  Well-developed and nourished.  Eyes: Anicteric no pallor.  ENT: No discharge from ears eyes nose mouth.  Neck: No mass felt.  Cardiovascular: S1-S2  heard.  Respiratory: No rhonchi or crepitations.  Abdomen: Soft nontender bowel sounds present.  Skin: No rash.  Musculoskeletal: Patient has been back pain on rotational movements of the spine and flexion and extension. There is no increased pain on straight leg raising.  Psychiatric: Appears normal.  Neurologic: Alert awake oriented to time place and person. Moves all extremities.  Labs on Admission:  Basic Metabolic Panel:  Recent Labs Lab 08/14/13 1400  NA 138  K 3.8  CL 102  CO2 26  GLUCOSE 87  BUN 11  CREATININE 0.62  CALCIUM 9.5   Liver Function Tests:  Recent  Labs Lab 08/14/13 1400  AST 21  ALT 17  ALKPHOS 86  BILITOT 0.4  PROT 6.9  ALBUMIN 3.8   No results found for this basename: LIPASE, AMYLASE,  in the last 168 hours No results found for this basename: AMMONIA,  in the last 168 hours CBC:  Recent Labs Lab 08/14/13 1400  WBC 5.2  HGB 14.6  HCT 42.9  MCV 93.9  PLT 327   Cardiac Enzymes: No results found for this basename: CKTOTAL, CKMB, CKMBINDEX, TROPONINI,  in the last 168 hours  BNP (last 3 results) No results found for this basename: PROBNP,  in the last 8760 hours CBG: No results found for this basename: GLUCAP,  in the last 168 hours  Radiological Exams on Admission: Dg Chest 2 View  08/14/2013   CLINICAL DATA:  Chest pain.  EXAM: CHEST  2 VIEW  COMPARISON:  Chest radiograph 09/05/2010.  FINDINGS: Normal cardiac and mediastinal contours. No consolidative pulmonary opacities. No pleural effusion or pneumothorax. Regional skeleton is unremarkable. Nodular densities are demonstrated projecting over the bilateral lower lungs.  IMPRESSION: No acute cardiopulmonary process.  Nodular densities projecting over the bilateral lower lungs, likely representing nipple shadows recommend repeat evaluation with nipple markers in place.   Electronically Signed   By: Annia Belt M.D.   On: 08/14/2013 14:48   Ct Thoracic Spine Wo Contrast  08/14/2013   CLINICAL DATA:  Back pain.  EXAM: CT THORACIC AND LUMBAR SPINE WITHOUT CONTRAST  TECHNIQUE: Multidetector CT imaging of the thoracic and lumbar spine was performed without contrast. Multiplanar CT image reconstructions were also generated.  COMPARISON:  None.  FINDINGS: CT THORACIC SPINE FINDINGS  Vertebral body height and alignment are normal. There is some mild anterior endplate spurring in the mid and lower thoracic spine. The central canal appears open by CT. No notable facet arthropathy is seen. No focal bony lesion is identified. Imaged lung parenchyma demonstrates lung mild dependent  atelectasis. No pleural effusion is noted.  CT LUMBAR SPINE FINDINGS  Vertebral body height and alignment are normal. No pars interarticularis defect is identified. Mild disc bulging and endplate spurring are seen at L4-5 and L5-S1. The central canal appears open at each level with mild left foraminal narrowing identified at L4-5 and bilateral foraminal narrowing at L5-S1. Vacuum disc phenomena at L5-S1 is noted. Imaged intra-abdominal contents demonstrate a right renal cyst.  IMPRESSION: CT THORACIC SPINE IMPRESSION  Mild degenerative disease.  No focal abnormality.  CT LUMBAR SPINE IMPRESSION  Mild degenerative change lower lumbar spine without central canal stenosis. There is some foraminal narrowing on the left at L4-5 and bilaterally at L5-S1 without overt nerve root compression.   Electronically Signed   By: Drusilla Kanner M.D.   On: 08/14/2013 18:55   Ct Lumbar Spine Wo Contrast  08/14/2013   CLINICAL DATA:  Back pain.  EXAM: CT THORACIC  AND LUMBAR SPINE WITHOUT CONTRAST  TECHNIQUE: Multidetector CT imaging of the thoracic and lumbar spine was performed without contrast. Multiplanar CT image reconstructions were also generated.  COMPARISON:  None.  FINDINGS: CT THORACIC SPINE FINDINGS  Vertebral body height and alignment are normal. There is some mild anterior endplate spurring in the mid and lower thoracic spine. The central canal appears open by CT. No notable facet arthropathy is seen. No focal bony lesion is identified. Imaged lung parenchyma demonstrates lung mild dependent atelectasis. No pleural effusion is noted.  CT LUMBAR SPINE FINDINGS  Vertebral body height and alignment are normal. No pars interarticularis defect is identified. Mild disc bulging and endplate spurring are seen at L4-5 and L5-S1. The central canal appears open at each level with mild left foraminal narrowing identified at L4-5 and bilateral foraminal narrowing at L5-S1. Vacuum disc phenomena at L5-S1 is noted. Imaged  intra-abdominal contents demonstrate a right renal cyst.  IMPRESSION: CT THORACIC SPINE IMPRESSION  Mild degenerative disease.  No focal abnormality.  CT LUMBAR SPINE IMPRESSION  Mild degenerative change lower lumbar spine without central canal stenosis. There is some foraminal narrowing on the left at L4-5 and bilaterally at L5-S1 without overt nerve root compression.   Electronically Signed   By: Drusilla Kanner M.D.   On: 08/14/2013 18:55    EKG: Independently reviewed. Normal sinus rhythm.  Assessment/Plan Principal Problem:   Back pain Active Problems:   HYPOTHYROIDISM   Elevated blood pressure   1. Mid back pain - patient's pains characteristics are concerning for spine compression fracture. At this time I have ordered MRI of the T-spine and L-spine. Continue with pain relief medications. Further recommendations based on the MRI. If MRI is unremarkable then may need intra-abdominal and chest studies. 2. Elevated blood pressure - probably related to pain. For now I have placed patient on when necessary IV hydralazine for systolic blood pressure more than 160. 3. Hypothyroidism - continue Synthroid.    Code Status: Full code.  Family Communication: Family at the bedside.  Disposition Plan: Admit to inpatient.    Jamarea Selner N. [facility] Pager [phone].  If 7PM-7AM, please contact night-coverage [email or web address] Password [facility] 08/15/2013, 3:18 AM

## 2013-08-16 ENCOUNTER — Observation Stay (HOSPITAL_COMMUNITY): Payer: 59

## 2013-08-16 DIAGNOSIS — M129 Arthropathy, unspecified: Secondary | ICD-10-CM

## 2013-08-16 MED ORDER — PREDNISONE 20 MG PO TABS: ORAL_TABLET | ORAL | Status: DC

## 2013-08-16 MED ORDER — HYDROCODONE-ACETAMINOPHEN 5-325 MG PO TABS: 1.0000 | ORAL_TABLET | Freq: Four times a day (QID) | ORAL | Status: DC | PRN

## 2013-08-16 MED ORDER — PANTOPRAZOLE SODIUM 20 MG PO TBEC: 20.0000 mg | DELAYED_RELEASE_TABLET | Freq: Every day | ORAL | Status: DC

## 2013-08-16 NOTE — Discharge Summary (Signed)
Physician Discharge Summary  [name] AVW:[id number] DOB: July 02, 1951 DOA: 08/14/2013  PCP: No primary provider on file.  Admit date: 08/14/2013 Discharge date: 08/16/2013  Time spent: 35 minutes  Recommendations for Outpatient Follow-up:  1. Follow up with Dr Jordan Likes for further evaluation and treatment of back pain.   Discharge Diagnoses:    Back pain   HYPOTHYROIDISM   Elevated blood pressure   Discharge Condition: stable  Diet recommendation: Regular diet  Filed Weights   08/15/13 0008 08/15/13 0500  Weight: 67.359 kg (148 lb 8 oz) 67.359 kg (148 lb 8 oz)    History of present illness:  [name] is a 62 y.o. female with history of hypothyroidism started to experience mid back pain since Monday afternoon. Patient denies any fall or trauma. The pain has been stabbing in nature persistent increases on minimal movements. Pain radiates to the front of the chest. In the ER troponins and EKG were unremarkable. Patient had CT of the T-spine and L-spine which only showed some degenerative changes and foraminal narrowing in the L4-L5. Since patient's pain is persistent has been admitted for further workup including MRI. Patient denies any incontinence of urine or bowels. Denies any weakness of the upper lower extremities. Patient did have some nausea and vomiting after receiving pain medications has no abdominal pain diarrhea. Patient denies any shortness of breath other than the radiating pain to the chest patient has no chest pain.   Hospital Course:  1-Back pain: could be radicular pain. Patient pain improved with toradol, vicodin. MRI cervical, thoracic , lumbar spine with arthritis and mild spondyloses. Result review with Dr Jordan Likes. Patient to follow up outpatient with Dr pool. I will provide short course prednisone. protonix for GI prophylasix prescribe.   Elevated blood pressure - probably related to pain. Resolved. I have discontinue at discharge pseudoephedrine.   Hypothyroidism - continue Synthroid.   Procedures:  none  Consultations:  none  Discharge Exam: Filed Vitals:   08/16/13 0500  BP: 121/74  Pulse: 73  Temp: 98.2 F (36.8 C)  Resp: 16    General: No Acute distress.  Cardiovascular: S 1, S 2 RRR Respiratory: CTA Neuro exam non focal.   Discharge Instructions  Discharge Orders   Future Orders Complete By Expires   Diet - low sodium heart healthy  As directed    Increase activity slowly  As directed        Medication List    STOP taking these medications       diphenhydrAMINE 25 MG tablet  Commonly known as:  BENADRYL     NAPROSYN PO     pseudoephedrine 30 MG tablet  Commonly known as:  SUDAFED      TAKE these medications       acetaminophen 325 MG tablet  Commonly known as:  TYLENOL  Take 650 mg by mouth every 6 (six) hours as needed for mild pain.     ALPRAZolam 1 MG tablet  Commonly known as:  XANAX  Take 1 mg by mouth at bedtime as needed for anxiety.     HYDROcodone-acetaminophen 5-325 MG per tablet  Commonly known as:  NORCO/VICODIN  Take 1 tablet by mouth every 6 (six) hours as needed for moderate pain.     levothyroxine 112 MCG tablet  Commonly known as:  SYNTHROID, LEVOTHROID  Take 112 mcg by mouth daily.     multivitamin tablet  Take 1 tablet by mouth daily.     pantoprazole 20 MG tablet  Commonly known as:  PROTONIX  Take 1 tablet (20 mg total) by mouth daily.     predniSONE 20 MG tablet  Commonly known as:  DELTASONE  Take 2 tablet for 3 days then 1 tablet for 2 days.       Allergies  Allergen Reactions  . Flagyl [Metronidazole] Anaphylaxis       Follow-up Information   Follow up with POOL,HENRY A, MD In 5 days.   Specialty:  Neurosurgery   Contact information:   [address] [place] [address] [phone]        The results of significant diagnostics from this hospitalization (including imaging, microbiology, ancillary and laboratory) are  listed below for reference.    Significant Diagnostic Studies: Dg Chest 2 View  08/14/2013   CLINICAL DATA:  Chest pain.  EXAM: CHEST  2 VIEW  COMPARISON:  Chest radiograph 09/05/2010.  FINDINGS: Normal cardiac and mediastinal contours. No consolidative pulmonary opacities. No pleural effusion or pneumothorax. Regional skeleton is unremarkable. Nodular densities are demonstrated projecting over the bilateral lower lungs.  IMPRESSION: No acute cardiopulmonary process.  Nodular densities projecting over the bilateral lower lungs, likely representing nipple shadows recommend repeat evaluation with nipple markers in place.   Electronically Signed   By: Annia Belt M.D.   On: 08/14/2013 14:48   Ct Thoracic Spine Wo Contrast  08/14/2013   CLINICAL DATA:  Back pain.  EXAM: CT THORACIC AND LUMBAR SPINE WITHOUT CONTRAST  TECHNIQUE: Multidetector CT imaging of the thoracic and lumbar spine was performed without contrast. Multiplanar CT image reconstructions were also generated.  COMPARISON:  None.  FINDINGS: CT THORACIC SPINE FINDINGS  Vertebral body height and alignment are normal. There is some mild anterior endplate spurring in the mid and lower thoracic spine. The central canal appears open by CT. No notable facet arthropathy is seen. No focal bony lesion is identified. Imaged lung parenchyma demonstrates lung mild dependent atelectasis. No pleural effusion is noted.  CT LUMBAR SPINE FINDINGS  Vertebral body height and alignment are normal. No pars interarticularis defect is identified. Mild disc bulging and endplate spurring are seen at L4-5 and L5-S1. The central canal appears open at each level with mild left foraminal narrowing identified at L4-5 and bilateral foraminal narrowing at L5-S1. Vacuum disc phenomena at L5-S1 is noted. Imaged intra-abdominal contents demonstrate a right renal cyst.  IMPRESSION: CT THORACIC SPINE IMPRESSION  Mild degenerative disease.  No focal abnormality.  CT LUMBAR SPINE IMPRESSION   Mild degenerative change lower lumbar spine without central canal stenosis. There is some foraminal narrowing on the left at L4-5 and bilaterally at L5-S1 without overt nerve root compression.   Electronically Signed   By: Drusilla Kanner M.D.   On: 08/14/2013 18:55   Ct Lumbar Spine Wo Contrast  08/14/2013   CLINICAL DATA:  Back pain.  EXAM: CT THORACIC AND LUMBAR SPINE WITHOUT CONTRAST  TECHNIQUE: Multidetector CT imaging of the thoracic and lumbar spine was performed without contrast. Multiplanar CT image reconstructions were also generated.  COMPARISON:  None.  FINDINGS: CT THORACIC SPINE FINDINGS  Vertebral body height and alignment are normal. There is some mild anterior endplate spurring in the mid and lower thoracic spine. The central canal appears open by CT. No notable facet arthropathy is seen. No focal bony lesion is identified. Imaged lung parenchyma demonstrates lung mild dependent atelectasis. No pleural effusion is noted.  CT LUMBAR SPINE FINDINGS  Vertebral body height and alignment are normal. No pars interarticularis  defect is identified. Mild disc bulging and endplate spurring are seen at L4-5 and L5-S1. The central canal appears open at each level with mild left foraminal narrowing identified at L4-5 and bilateral foraminal narrowing at L5-S1. Vacuum disc phenomena at L5-S1 is noted. Imaged intra-abdominal contents demonstrate a right renal cyst.  IMPRESSION: CT THORACIC SPINE IMPRESSION  Mild degenerative disease.  No focal abnormality.  CT LUMBAR SPINE IMPRESSION  Mild degenerative change lower lumbar spine without central canal stenosis. There is some foraminal narrowing on the left at L4-5 and bilaterally at L5-S1 without overt nerve root compression.   Electronically Signed   By: Drusilla Kanner M.D.   On: 08/14/2013 18:55   Mr Cervical Spine Wo Contrast  08/16/2013   CLINICAL DATA:  Back pain.  Tingling in hands.  EXAM: MRI CERVICAL SPINE WITHOUT CONTRAST  TECHNIQUE: Multiplanar,  multisequence MR imaging was performed. No intravenous contrast was administered.  COMPARISON:  None.  FINDINGS: Negative for fracture or mass. Spinal cord signal is normal. No cord compression or edema is identified. Cranial cervical junction is normal.  C2-3:  Negative  C3-4:  Small central disc protrusion without stenosis.  C4-5:  Disc degeneration with mild spondylosis.  C5-6: 2 mm retrolisthesis. Moderate spondylosis is present with mild spinal stenosis and moderate foraminal narrowing bilaterally.  C6-7:  Negative  C7-T1:  Negative  IMPRESSION: Disc degeneration and spondylosis at C5-6 causing mild spinal stenosis and moderate foraminal narrowing bilaterally.   Electronically Signed   By: Marlan Palau M.D.   On: 08/16/2013 08:14   Mr Thoracic Spine Wo Contrast  08/15/2013   CLINICAL DATA:  Back pain  EXAM: MRI THORACIC AND LUMBAR SPINE WITHOUT CONTRAST  TECHNIQUE: Multiplanar and multiecho pulse sequences of the thoracic and lumbar spine were obtained without intravenous contrast.  COMPARISON:  CT 08/14/2013  FINDINGS: MR THORACIC SPINE FINDINGS  Negative for fracture or mass in the thoracic spine. No bone marrow or soft tissue edema is present. Spinal cord signal is normal. Increased signal within the cerebral spinal fluid posterior to the cord is most likely due to CSF pulsation flow related artifact. Negative for cord compression.  Mild thoracic degenerative change. Mild spondylosis at T9-10. Left-sided osteophyte at T10-11. Mild spondylosis and small central disc protrusion at T11-T12.  MR LUMBAR SPINE FINDINGS  4 mm posterior slip L5-S1. Negative for fracture or mass lesion. Conus medullaris is normal and terminates at L1-2.  L1-2:  Schmorl's nodes without spinal stenosis  L2-3:  Schmorl's nodes without disc protrusion or stenosis  L3-4:  Normal disc.  Mild facet degeneration without stenosis  L4-5: Left foraminal disc protrusion and osteophyte. Mild left foraminal encroachment is present. Mild  facet hypertrophy. Left lateral recess also shows stenosis. This may cause impingement of the left L5 nerve root in the lateral recess.  L5-S1: 4 mm posterior slip. Prior decompressive laminectomy on the left. Marked disc degeneration. There is spondylosis causing foraminal encroachment bilaterally left greater than right. Possible impingement of the left L5 nerve root. There appears to be some scarring around the left S1 nerve root related to prior surgery.  IMPRESSION: MR THORACIC SPINE IMPRESSION  Mild thoracic disc degeneration as above. Negative for fracture or mass lesion. No cord compression in the thoracic spine.  MR LUMBAR SPINE IMPRESSION  Prior laminectomy left L5-S1. There is spondylosis at L5-S1 with foraminal narrowing left greater than right.  Left foraminal disc protrusion L4-5 with narrowing of the left lateral recess and left foramen at L4-5  Electronically Signed   By: Marlan Palau M.D.   On: 08/15/2013 17:44   Mr Lumbar Spine Wo Contrast  08/15/2013   CLINICAL DATA:  Back pain  EXAM: MRI THORACIC AND LUMBAR SPINE WITHOUT CONTRAST  TECHNIQUE: Multiplanar and multiecho pulse sequences of the thoracic and lumbar spine were obtained without intravenous contrast.  COMPARISON:  CT 08/14/2013  FINDINGS: MR THORACIC SPINE FINDINGS  Negative for fracture or mass in the thoracic spine. No bone marrow or soft tissue edema is present. Spinal cord signal is normal. Increased signal within the cerebral spinal fluid posterior to the cord is most likely due to CSF pulsation flow related artifact. Negative for cord compression.  Mild thoracic degenerative change. Mild spondylosis at T9-10. Left-sided osteophyte at T10-11. Mild spondylosis and small central disc protrusion at T11-T12.  MR LUMBAR SPINE FINDINGS  4 mm posterior slip L5-S1. Negative for fracture or mass lesion. Conus medullaris is normal and terminates at L1-2.  L1-2:  Schmorl's nodes without spinal stenosis  L2-3:  Schmorl's nodes without  disc protrusion or stenosis  L3-4:  Normal disc.  Mild facet degeneration without stenosis  L4-5: Left foraminal disc protrusion and osteophyte. Mild left foraminal encroachment is present. Mild facet hypertrophy. Left lateral recess also shows stenosis. This may cause impingement of the left L5 nerve root in the lateral recess.  L5-S1: 4 mm posterior slip. Prior decompressive laminectomy on the left. Marked disc degeneration. There is spondylosis causing foraminal encroachment bilaterally left greater than right. Possible impingement of the left L5 nerve root. There appears to be some scarring around the left S1 nerve root related to prior surgery.  IMPRESSION: MR THORACIC SPINE IMPRESSION  Mild thoracic disc degeneration as above. Negative for fracture or mass lesion. No cord compression in the thoracic spine.  MR LUMBAR SPINE IMPRESSION  Prior laminectomy left L5-S1. There is spondylosis at L5-S1 with foraminal narrowing left greater than right.  Left foraminal disc protrusion L4-5 with narrowing of the left lateral recess and left foramen at L4-5   Electronically Signed   By: Marlan Palau M.D.   On: 08/15/2013 17:44    Microbiology: No results found for this or any previous visit (from the past 240 hour(s)).   Labs: Basic Metabolic Panel:  Recent Labs Lab 08/14/13 1400 08/15/13 0400  NA 138 139  K 3.8 3.5  CL 102 105  CO2 26 24  GLUCOSE 87 106*  BUN 11 7  CREATININE 0.62 0.47*  CALCIUM 9.5 8.1*   Liver Function Tests:  Recent Labs Lab 08/14/13 1400 08/15/13 0400  AST 21 19  ALT 17 14  ALKPHOS 86 73  BILITOT 0.4 0.4  PROT 6.9 5.9*  ALBUMIN 3.8 3.2*    Recent Labs Lab 08/15/13 0400  LIPASE 25   No results found for this basename: AMMONIA,  in the last 168 hours CBC:  Recent Labs Lab 08/14/13 1400 08/15/13 0400  WBC 5.2 6.0  NEUTROABS  --  4.5  HGB 14.6 12.8  HCT 42.9 37.4  MCV 93.9 93.3  PLT 327 269   Cardiac Enzymes:  Recent Labs Lab 08/15/13 0400   TROPONINI <0.30   BNP: BNP (last 3 results) No results found for this basename: PROBNP,  in the last 8760 hours CBG: No results found for this basename: GLUCAP,  in the last 168 hours     Signed:  REGALADO,BELKYS  [facility] 08/16/2013, 10:21 AM

## 2014-06-19 ENCOUNTER — Other Ambulatory Visit: Payer: Self-pay | Admitting: Obstetrics & Gynecology

## 2015-01-15 ENCOUNTER — Encounter: Payer: Self-pay | Admitting: Internal Medicine

## 2015-01-15 ENCOUNTER — Encounter: Payer: Self-pay | Admitting: Gastroenterology

## 2017-04-19 ENCOUNTER — Ambulatory Visit (INDEPENDENT_AMBULATORY_CARE_PROVIDER_SITE_OTHER): Payer: BLUE CROSS/BLUE SHIELD | Admitting: Family Medicine

## 2017-04-19 ENCOUNTER — Encounter: Payer: Self-pay | Admitting: Family Medicine

## 2017-04-19 DIAGNOSIS — M79672 Pain in left foot: Secondary | ICD-10-CM

## 2017-04-19 DIAGNOSIS — M722 Plantar fascial fibromatosis: Secondary | ICD-10-CM | POA: Diagnosis not present

## 2017-04-19 DIAGNOSIS — M79671 Pain in right foot: Secondary | ICD-10-CM

## 2017-04-19 NOTE — Patient Instructions (Signed)
You have plantar fasciitis Take tylenol and/or aleve as needed for pain  Plantar fascia stretch for 20-30 seconds (do 3 of these) in morning Lowering/raise on a step exercises ([place] step-o-matics) 3 x 10 once or twice a day - this is very important for long term recovery. Can add heel walks, toe walks forward and backward as well Ice heel for 15 minutes as needed. Avoid flat shoes/barefoot walking as much as possible. Arch straps have been shown to help with pain - wear when up and walking around. Inserts are important (spencos, our green insoles, custom orthotics). Steroid injection is a consideration for short term pain relief if you are struggling. Physical therapy is also an option. Also consider night splints, massage, active release. Follow up with me in 6 weeks.

## 2017-04-20 DIAGNOSIS — M722 Plantar fascial fibromatosis: Secondary | ICD-10-CM | POA: Insufficient documentation

## 2017-04-20 NOTE — Progress Notes (Signed)
PCP: Laurann MontanaWhite, Cynthia, MD  Subjective:   HPI: Patient is a 66 y.o. female here for left heel pain.  Patient reports remote history of right plantar fasciitis. She states about 3 months ago started to have similar pain in left heel, plantar area. Pain 7/10 and sharp, worse with walking, standing, after sleeping. No swelling or bruising. No injury or trauma. Tried new shoes, some of the exercises from when she had issues with the right side. Knows she has high arches. No numbness, skin changes.  Past Medical History:  Diagnosis Date  . Anxiety   . Depression   . Hypothyroidism   . Thyroid disease     Current Outpatient Prescriptions on File Prior to Visit  Medication Sig Dispense Refill  . Multiple Vitamin (MULTIVITAMIN) tablet Take 1 tablet by mouth daily.     No current facility-administered medications on file prior to visit.     Past Surgical History:  Procedure Laterality Date  . BACK SURGERY    . BUNIONECTOMY Right   . CESAREAN SECTION      Allergies  Allergen Reactions  . Flagyl [Metronidazole] Anaphylaxis    Social History   Social History  . Marital status: Married    Spouse name: N/A  . Number of children: N/A  . Years of education: N/A   Occupational History  . Not on file.   Social History Main Topics  . Smoking status: Never Smoker  . Smokeless tobacco: Never Used  . Alcohol use Yes     Comment: socially  . Drug use: No  . Sexual activity: Not on file   Other Topics Concern  . Not on file   Social History Narrative  . No narrative on file    Family History  Problem Relation Age of Onset  . CAD Father   . Stroke Brother     BP 137/83   Pulse 73   Ht 5\' 8"  (1.727 m)   Wt 159 lb 9.6 oz (72.4 kg)   BMI 24.27 kg/m   Review of Systems: See HPI above.     Objective:  Physical Exam:  Gen: NAD, comfortable in exam room  Left foot/ankle: Cavus.  No gross deformity, swelling, ecchymoses FROM TTP medial calcaneus at plantar  fascia insertion.  No other tenderness. Negative calcaneal squeeze. Negative ant drawer and talar tilt.   Negative syndesmotic compression. Thompsons test negative. NV intact distally.  Right foot/ankle: Cavus.  FROM without pain.   Assessment & Plan:  1. Left plantar fasciitis - shown home exercises and stretches to do daily.  Tylenol and/or aleve.  Arch binders, encouraged arch supports (sports insoles, spencos).  Icing as needed.  Consider injection, physical therapy, night splints, massage, active release.  F/u in 6 weeks.

## 2017-04-20 NOTE — Assessment & Plan Note (Signed)
shown home exercises and stretches to do daily.  Tylenol and/or aleve.  Arch binders, encouraged arch supports (sports insoles, spencos).  Icing as needed.  Consider injection, physical therapy, night splints, massage, active release.  F/u in 6 weeks.

## 2017-05-02 ENCOUNTER — Telehealth: Payer: Self-pay | Admitting: Family Medicine

## 2017-05-02 NOTE — Telephone Encounter (Signed)
Spoke to patient and she would like to do x-rays for both feet. Told patient would contact her when orders have been placed.

## 2017-05-02 NOTE — Telephone Encounter (Signed)
Spoke with patient and gave her information. Patient wanted to know if an x-ray could be done for her feet.

## 2017-05-02 NOTE — Telephone Encounter (Signed)
Patient states the pain is getting worse in her feet. She bought an arch support and has been doing the home exercises, as well as wearing the arch straps, but states nothing is relieving her pain. Patient would like to know where to go from here

## 2017-05-02 NOTE — Telephone Encounter (Signed)
We could if she wanted.  The issue is the findings are unlikely to change our management.  Her history and exam do not suggest a fracture.  And even if we saw 'bone spurs' treatment for these is to treat the underlying condition (plantar fasciitis).  But let me know after giving her this info if she wants to do x-rays.

## 2017-05-02 NOTE — Telephone Encounter (Signed)
We talked about a few options on where she could go if not improving - physical therapy, cortisone injection, custom orthotics (we also discussed massage, night splints, active release).  If pain is very severe we could do the injection.  If she's doing the 'Zigmund Linse up-downs' and not getting relief we could go to physical therapy and/or custom orthotics.

## 2017-05-03 ENCOUNTER — Ambulatory Visit (HOSPITAL_BASED_OUTPATIENT_CLINIC_OR_DEPARTMENT_OTHER)
Admission: RE | Admit: 2017-05-03 | Discharge: 2017-05-03 | Disposition: A | Discharge status: Home / Self Care | Payer: BLUE CROSS/BLUE SHIELD | Source: Ambulatory Visit | Attending: Family Medicine | Admitting: Family Medicine

## 2017-05-03 ENCOUNTER — Ambulatory Visit (INDEPENDENT_AMBULATORY_CARE_PROVIDER_SITE_OTHER): Payer: BLUE CROSS/BLUE SHIELD | Admitting: Family Medicine

## 2017-05-03 ENCOUNTER — Encounter: Payer: Self-pay | Admitting: Family Medicine

## 2017-05-03 VITALS — BP 124/82 | HR 81 | Ht 68.0 in | Wt 160.0 lb

## 2017-05-03 DIAGNOSIS — M79672 Pain in left foot: Secondary | ICD-10-CM | POA: Insufficient documentation

## 2017-05-03 DIAGNOSIS — M722 Plantar fascial fibromatosis: Secondary | ICD-10-CM

## 2017-05-03 DIAGNOSIS — Z8739 Personal history of other diseases of the musculoskeletal system and connective tissue: Secondary | ICD-10-CM | POA: Insufficient documentation

## 2017-05-03 DIAGNOSIS — M7732 Calcaneal spur, left foot: Secondary | ICD-10-CM | POA: Insufficient documentation

## 2017-05-03 DIAGNOSIS — M79671 Pain in right foot: Secondary | ICD-10-CM | POA: Diagnosis present

## 2017-05-03 MED ORDER — METHYLPREDNISOLONE ACETATE 40 MG/ML IJ SUSP
40.0000 mg | Freq: Once | INTRAMUSCULAR | Status: AC
  Administered 2017-05-03: 40 mg via INTRA_ARTICULAR

## 2017-05-03 NOTE — Addendum Note (Signed)
Addended by: Kathi Simpers F on: 05/03/2017 08:44 AM   Modules accepted: Orders

## 2017-05-03 NOTE — Telephone Encounter (Signed)
Gunnar Fusi - you can place these orders as you've done before.  You don't need to wait for me to do them.

## 2017-05-03 NOTE — Patient Instructions (Signed)
Continue with the inserts, arch binders. I'd probably wait 5-7 days before restarting your exercises though.

## 2017-05-03 NOTE — Telephone Encounter (Signed)
Orders placed for xrays.

## 2017-05-04 NOTE — Assessment & Plan Note (Signed)
Injection given today for left side as pain has become very severe.  Continue home exercises, stretches, inserts (has spencos), arch binders.  Icing, tylenol and/or aleve.  F/u as planned in about 4 weeks.  After informed written consent patient was seated on exam table.  Area overlying medial plantar fascia near insertion on calcaneus prepped with alcohol swab.  Then using ultrasound guidance patient's left plantar fascia was injected with 2:1 bupivicaine: depomedrol with multiple needle fenestrations.  Patient tolerated procedure well without immediate complications.

## 2017-05-04 NOTE — Progress Notes (Signed)
PCP: Laurann MontanaWhite, Cynthia, MD  Subjective:   HPI: Patient is a 66 y.o. female here for left heel pain.  8/14: Patient reports remote history of right plantar fasciitis. She states about 3 months ago started to have similar pain in left heel, plantar area. Pain 7/10 and sharp, worse with walking, standing, after sleeping. No swelling or bruising. No injury or trauma. Tried new shoes, some of the exercises from when she had issues with the right side. Knows she has high arches. No numbness, skin changes.  8/28: Patient returns today for injection for left plantar fasciitis.  Past Medical History:  Diagnosis Date  . Anxiety   . Depression   . Hypothyroidism   . Thyroid disease     Current Outpatient Prescriptions on File Prior to Visit  Medication Sig Dispense Refill  . fluticasone (FLONASE) 50 MCG/ACT nasal spray   3  . halobetasol (ULTRAVATE) 0.05 % ointment   0  . levothyroxine (SYNTHROID, LEVOTHROID) 125 MCG tablet   1  . Multiple Vitamin (MULTIVITAMIN) tablet Take 1 tablet by mouth daily.     No current facility-administered medications on file prior to visit.     Past Surgical History:  Procedure Laterality Date  . BACK SURGERY    . BUNIONECTOMY Right   . CESAREAN SECTION      Allergies  Allergen Reactions  . Flagyl [Metronidazole] Anaphylaxis    Social History   Social History  . Marital status: Married    Spouse name: N/A  . Number of children: N/A  . Years of education: N/A   Occupational History  . Not on file.   Social History Main Topics  . Smoking status: Never Smoker  . Smokeless tobacco: Never Used  . Alcohol use Yes     Comment: socially  . Drug use: No  . Sexual activity: Not on file   Other Topics Concern  . Not on file   Social History Narrative  . No narrative on file    Family History  Problem Relation Age of Onset  . CAD Father   . Stroke Brother     BP 124/82   Pulse 81   Ht 5\' 8"  (1.727 m)   Wt 160 lb (72.6 kg)   BMI  24.33 kg/m   Review of Systems: See HPI above.     Objective:  Physical Exam:  Gen: NAD, comfortable in exam room  Left foot/ankle: Cavus.  No gross deformity, swelling, ecchymoses FROM TTP medial calcaneus at plantar fascia insertion.  No other tenderness. Negative calcaneal squeeze. Thompsons test negative.  Right foot/ankle: Cavus.  FROM without pain.   Assessment & Plan:  1. Left plantar fasciitis - Injection given today for left side as pain has become very severe.  Continue home exercises, stretches, inserts (has spencos), arch binders.  Icing, tylenol and/or aleve.  F/u as planned in about 4 weeks.  After informed written consent patient was seated on exam table.  Area overlying medial plantar fascia near insertion on calcaneus prepped with alcohol swab.  Then using ultrasound guidance patient's left plantar fascia was injected with 2:1 bupivicaine: depomedrol with multiple needle fenestrations.  Patient tolerated procedure well without immediate complications.

## 2017-08-22 ENCOUNTER — Other Ambulatory Visit: Payer: Self-pay

## 2017-08-22 ENCOUNTER — Encounter (HOSPITAL_BASED_OUTPATIENT_CLINIC_OR_DEPARTMENT_OTHER): Payer: Self-pay | Admitting: *Deleted

## 2017-08-22 ENCOUNTER — Emergency Department (HOSPITAL_BASED_OUTPATIENT_CLINIC_OR_DEPARTMENT_OTHER)
Admission: EM | Admit: 2017-08-22 | Discharge: 2017-08-22 | Disposition: A | Discharge status: Home / Self Care | Payer: BLUE CROSS/BLUE SHIELD | Attending: Emergency Medicine | Admitting: Emergency Medicine

## 2017-08-22 ENCOUNTER — Emergency Department (HOSPITAL_BASED_OUTPATIENT_CLINIC_OR_DEPARTMENT_OTHER): Payer: BLUE CROSS/BLUE SHIELD

## 2017-08-22 DIAGNOSIS — Z79899 Other long term (current) drug therapy: Secondary | ICD-10-CM | POA: Diagnosis not present

## 2017-08-22 DIAGNOSIS — B029 Zoster without complications: Secondary | ICD-10-CM | POA: Diagnosis not present

## 2017-08-22 DIAGNOSIS — M545 Low back pain, unspecified: Secondary | ICD-10-CM

## 2017-08-22 DIAGNOSIS — J45909 Unspecified asthma, uncomplicated: Secondary | ICD-10-CM | POA: Insufficient documentation

## 2017-08-22 DIAGNOSIS — M549 Dorsalgia, unspecified: Secondary | ICD-10-CM | POA: Diagnosis present

## 2017-08-22 DIAGNOSIS — E039 Hypothyroidism, unspecified: Secondary | ICD-10-CM | POA: Insufficient documentation

## 2017-08-22 MED ORDER — VALACYCLOVIR HCL 1 G PO TABS: 1000.0000 mg | ORAL_TABLET | Freq: Three times a day (TID) | ORAL | 0 refills | Status: DC

## 2017-08-22 MED ORDER — GABAPENTIN 300 MG PO CAPS: ORAL_CAPSULE | ORAL | 0 refills | Status: DC

## 2017-08-22 MED ORDER — KETOROLAC TROMETHAMINE 15 MG/ML IJ SOLN
15.0000 mg | Freq: Once | INTRAMUSCULAR | Status: AC
  Administered 2017-08-22: 15 mg via INTRAVENOUS
  Filled 2017-08-22: qty 1

## 2017-08-22 MED ORDER — HYDROCODONE-ACETAMINOPHEN 5-325 MG PO TABS: 1.0000 | ORAL_TABLET | Freq: Four times a day (QID) | ORAL | 0 refills | Status: DC | PRN

## 2017-08-22 MED FILL — HYDROCODON-APAP 5-325: 5-325 | 2 days supply | Qty: 8 | Fill #0

## 2017-08-22 MED FILL — valACYclovir HCL 1 GM TABS: 1 | 7 days supply | Qty: 21 | Fill #0

## 2017-08-22 MED FILL — GABAPENTIN 300 MG CAPSULE: 300 | 10 days supply | Qty: 30 | Fill #0

## 2017-08-22 NOTE — ED Provider Notes (Signed)
[facility] EMERGENCY DEPARTMENT Provider Note   CSN: [id number] Arrival date & time: 08/22/17  1249     History   Chief Complaint Chief Complaint  Patient presents with  . Back Pain    HPI [name] is a 66 y.o. female hx of depression, hypothyroidism, back pain, here with back pain. Patient was shoveling snow about a week ago. She denies any pain or popping sensation at that time. About 6 days ago, she started having back pain that is intermittent and gradually worsening. Saw [facility] 3 days ago and was given robaxin with minimal relief. Went to urgent care yesterday and was given toradol IM and solumedrol and course of steroids, flexeril. She also had CXR that was normal at that time. Denies chest pain or shortness of breath. States that back pain persistent despite taking meds. She also noticed a rash on the back as well and has hx of chicken pox and shingles. Denies any leg pain or numbness or weakness. Denies fever or chills.   The history is provided by the patient.    Past Medical History:  Diagnosis Date  . Anxiety   . Depression   . Hypothyroidism   . Thyroid disease     Patient Active Problem List   Diagnosis Date Noted  . Plantar fasciitis of left foot 04/20/2017  . Elevated blood pressure 08/15/2013  . Back pain 08/14/2013  . HYPOTHYROIDISM 01/31/2008  . DEPRESSION 01/31/2008  . ASTHMA 01/31/2008  . ARTHRITIS 01/31/2008  . GASTRITIS 12/21/2007  . DIVERTICULOSIS, COLON 10/25/2003    Past Surgical History:  Procedure Laterality Date  . BACK SURGERY    . BUNIONECTOMY Right   . CESAREAN SECTION      OB History    No data available       Home Medications    Prior to Admission medications   Medication Sig Start Date End Date Taking? Authorizing Provider  fluticasone Aleda Grana(FLONASE) 50 MCG/ACT nasal spray  04/07/17  Yes [provider]  levothyroxine (SYNTHROID, LEVOTHROID) 125 MCG tablet  02/11/17  Yes [provider]  Multiple Vitamin (MULTIVITAMIN) tablet Take 1 tablet by mouth daily.   Yes [provider]  halobetasol (ULTRAVATE) 0.05 % ointment  02/04/17   [provider]    Family History Family History  Problem Relation Age of Onset  . CAD Father   . Stroke Brother     Social History Social History   Tobacco Use  . Smoking status: Never Smoker  . Smokeless tobacco: Never Used  Substance Use Topics  . Alcohol use: Yes    Comment: socially  . Drug use: No     Allergies   Flagyl [metronidazole]; Amoxicillin; Buspar [buspirone]; Celebrex [celecoxib]; and Cymbalta [duloxetine hcl]   Review of Systems Review of Systems  Musculoskeletal: Positive for back pain.  All other systems reviewed and are negative.    Physical Exam Updated Vital Signs BP (!) 141/78   Pulse 74   Temp 97.6 F (36.4 C) (Oral)   Resp 20   Ht 5\' 8"  (1.727 m)   Wt 72.6 kg (160 lb)   SpO2 100%   BMI 24.33 kg/m   Physical Exam  Constitutional: She is oriented to person, place, and time. She appears well-developed.  Slightly uncomfortable   HENT:  Head: Normocephalic.  Mouth/Throat: Oropharynx is clear and moist.  Eyes: Conjunctivae and EOM are normal. Pupils are equal, round, and reactive to light.  Neck: Normal range of  motion. Neck supple.  Cardiovascular: Normal rate, regular rhythm and normal heart sounds.  Pulmonary/Chest: Effort normal and breath sounds normal. No stridor. No respiratory distress.  Abdominal: Soft. Bowel sounds are normal. She exhibits no distension. There is no tenderness.  Musculoskeletal: Normal range of motion.  Upper lumbar tenderness on the R side. There is small vesicular rash R side of the lumbar spine, around L 1 distribution. No saddle anesthesia, nl gait, nl strength and sensation bilateral lower extremities   Neurological: She is alert and oriented to person, place, and time.  Skin: Skin is warm.  Psychiatric: She has a normal mood and affect.    Nursing note and vitals reviewed.    ED Treatments / Results  Labs (all labs ordered are listed, but only abnormal results are displayed) Labs Reviewed - No data to display  EKG  EKG Interpretation None       Radiology Ct Lumbar Spine Wo Contrast  Result Date: 08/22/2017 CLINICAL DATA:  Low back pain since shoveling snow a few days ago. EXAM: CT LUMBAR SPINE WITHOUT CONTRAST TECHNIQUE: Multidetector CT imaging of the lumbar spine was performed without intravenous contrast administration. Multiplanar CT image reconstructions were also generated. COMPARISON:  Lumbar spine MRI 08/15/2013 FINDINGS: Segmentation: 5 lumbar type vertebral bodies. The last full intervertebral disc space is labeled L5-S1. This correlates with the prior MRI. Alignment: Normal Vertebrae: Advanced degenerate disc disease at L5-S1 with disc space narrowing, vacuum disc phenomenon and discogenic sclerosis. This may be slightly progressive when compared to the prior MRI. No fracture or bone lesion. There are moderate multilevel facet degenerative changes but no pars defects. Paraspinal and other soft tissues: No significant paraspinal or retroperitoneal findings. Simple appearing right renal cyst. Disc levels: L1-2:  No significant findings. L2-3:  No significant findings. L3-4: Diffuse bulging annulus, facet disease and osteophytic ridging contributing to mild spinal and bilateral lateral recess stenosis. No significant foraminal stenosis. L4-5: Bulging annulus, facet disease and ligamentum flavum thickening with mild spinal and bilateral lateral recess stenosis. There is also mild foraminal encroachment bilaterally. L5-S1: Severe disc disease and advanced facet disease but no focal disc protrusion, significant spinal or foraminal stenosis. IMPRESSION: 1. No acute fracture. 2. Progressive and advanced degenerate disc disease at L5-S1. 3. Mild spinal and bilateral lateral recess stenosis at L3-4 and L4-5. Electronically  Signed   By: Rudie MeyerP.  Gallerani M.D.   On: 08/22/2017 15:49    Procedures Procedures (including critical care time)  Medications Ordered in ED Medications  ketorolac (TORADOL) 15 MG/ML injection 15 mg (15 mg Intravenous Given 08/22/17 1537)     Initial Impression / Assessment and Plan / ED Course  I have reviewed the triage vital signs and the nursing notes.  Pertinent labs & imaging results that were available during my care of the patient were reviewed by me and considered in my medical decision making (see chart for details).     [name] is a 66 y.o. female here with back pain. I think likely shingles as she started to develop a rash. She is neurovascular intact. She request CT lumbar spine to r/o lumbar radiculopathy, even though my suspicion is low. I doubt PE or ACS.   4:39 PM CT showed degenrative disc disease, no obvious large disc protrusion. Neurovascular intact, low suspicion for spinal cord impingement. I think likely shingles. She is on prednisone, will add valtrex, gabapentin, vicodin prn.    Final Clinical Impressions(s) / ED Diagnoses   Final diagnoses:  None  ED Discharge Orders    None       Charlynne Pander, MD 08/22/17 1640

## 2017-08-22 NOTE — ED Triage Notes (Signed)
Left scapula pain x 5 days. Started after shoveling snow. She was seen at [facility] x 2 and had a negative CXR.

## 2017-08-22 NOTE — Discharge Instructions (Signed)
Continue your steroids and muscle relaxants as prescribed by your doctor.   Add valtrex to treat shingles   Add gabapentin as prescribed.   Take vicodin for severe pain. It is a narcotic, don't drive with it   See your doctor in a week for follow up   Return to ER if you have worse back pain, chest pain, trouble breathing.

## 2017-09-16 ENCOUNTER — Ambulatory Visit: Payer: BLUE CROSS/BLUE SHIELD | Admitting: Family Medicine

## 2017-09-16 ENCOUNTER — Encounter: Payer: Self-pay | Admitting: Family Medicine

## 2017-09-16 DIAGNOSIS — M79672 Pain in left foot: Secondary | ICD-10-CM | POA: Diagnosis not present

## 2017-09-16 NOTE — Patient Instructions (Signed)
Your plantar fasciitis is improved. 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 Topical aspercreme up to 4 times a day - you can use this in addition to the deep blue you're using. Icing 15 minutes at a time 3-4 times a day. This usually resolves over time. A boot is a consideration and helps but I'd worry it will mess your back up more. Physical therapy is another consideration if you're struggling. Call me if you're not improving and want to try therapy - otherwise follow up as needed.

## 2017-09-20 ENCOUNTER — Encounter: Payer: Self-pay | Admitting: Family Medicine

## 2017-09-20 DIAGNOSIS — M79672 Pain in left foot: Secondary | ICD-10-CM | POA: Insufficient documentation

## 2017-09-20 NOTE — Progress Notes (Signed)
PCP: Laurann MontanaWhite, Cynthia, MD  Subjective:   HPI: Patient is a 67 y.o. female here for left foot pain, right foot pain.  8/14: Patient reports remote history of right plantar fasciitis. She states about 3 months ago started to have similar pain in left heel, plantar area. Pain 7/10 and sharp, worse with walking, standing, after sleeping. No swelling or bruising. No injury or trauma. Tried new shoes, some of the exercises from when she had issues with the right side. Knows she has high arches. No numbness, skin changes.  05/03/17: Patient returns today for injection for left plantar fasciitis.  09/16/17: Patient reports injection eased her left heel pain but didn't take this away. She's now having more pain on top of her left foot. Using supportive shoes, arch binders. No swelling or bruising. On feet a lot at work, pain worse with this. Pain level 5/10 left foot, sharp.  2/10 on right and more dull. No skin changes, numbness.  Past Medical History:  Diagnosis Date  . Anxiety   . Depression   . Hypothyroidism   . Thyroid disease     Current Outpatient Medications on File Prior to Visit  Medication Sig Dispense Refill  . fluticasone (FLONASE) 50 MCG/ACT nasal spray   3  . gabapentin (NEURONTIN) 300 MG capsule Take 300 mg first day, then 300 mg BID for a day, then 300 mg TID afterwards. 30 capsule 0  . halobetasol (ULTRAVATE) 0.05 % ointment   0  . HYDROcodone-acetaminophen (NORCO/VICODIN) 5-325 MG tablet Take 1 tablet by mouth every 6 (six) hours as needed. 8 tablet 0  . levothyroxine (SYNTHROID, LEVOTHROID) 125 MCG tablet   1  . Multiple Vitamin (MULTIVITAMIN) tablet Take 1 tablet by mouth daily.    . valACYclovir (VALTREX) 1000 MG tablet Take 1 tablet (1,000 mg total) by mouth 3 (three) times daily. 21 tablet 0   No current facility-administered medications on file prior to visit.     Past Surgical History:  Procedure Laterality Date  . BACK SURGERY    . BUNIONECTOMY  Right   . CESAREAN SECTION      Allergies  Allergen Reactions  . Flagyl [Metronidazole] Anaphylaxis  . Amoxicillin     rash  . Buspar [Buspirone]     Heart palpations  . Celebrex [Celecoxib]     Chest pain  . Cymbalta [Duloxetine Hcl]     depression  . Gabapentin     Social History   Socioeconomic History  . Marital status: Married    Spouse name: Not on file  . Number of children: Not on file  . Years of education: Not on file  . Highest education level: Not on file  Social Needs  . Financial resource strain: Not on file  . Food insecurity - worry: Not on file  . Food insecurity - inability: Not on file  . Transportation needs - medical: Not on file  . Transportation needs - non-medical: Not on file  Occupational History  . Not on file  Tobacco Use  . Smoking status: Never Smoker  . Smokeless tobacco: Never Used  Substance and Sexual Activity  . Alcohol use: Yes    Comment: socially  . Drug use: No  . Sexual activity: Not on file  Other Topics Concern  . Not on file  Social History Narrative  . Not on file    Family History  Problem Relation Age of Onset  . CAD Father   . Stroke Brother  BP 129/90   Pulse 74   Ht 5\' 8"  (1.727 m)   Wt 165 lb (74.8 kg)   BMI 25.09 kg/m   Review of Systems: See HPI above.     Objective:  Physical Exam:  Gen: NAD, comfortable in exam room.  Left foot/ankle: Cavus.  No gross deformity, swelling, ecchymoses FROM with 5/5 strength all directions. TTP mildly over extensor digitorum and proximal plantar fascia.  No other tenderness. Negative ant drawer and talar tilt.   Negative syndesmotic compression. Negative calcaneal squeeze. Thompsons test negative. NV intact distally.  Right foot/ankle: Cavus.  No gross deformity, swelling, ecchymoses FROM with 5/5 strength. No TTP Negative ant drawer and talar tilt.   Negative syndesmotic compression. Thompsons test negative. NV intact distally.   Assessment &  Plan:  1. Left foot pain - 2/2 extensor digitorum tendinopathy and plantar fasciitis.  Latter is improved.  Topical aspercreme, icing, shown some home exercises that may help.  Reassured.  Consider physical therapy.  F/u prn if improving as expected.

## 2017-09-20 NOTE — Assessment & Plan Note (Signed)
2/2 extensor digitorum tendinopathy and plantar fasciitis.  Latter is improved.  Topical aspercreme, icing, shown some home exercises that may help.  Reassured.  Consider physical therapy.  F/u prn if improving as expected.

## 2018-04-17 DIAGNOSIS — H25813 Combined forms of age-related cataract, bilateral: Secondary | ICD-10-CM | POA: Diagnosis not present

## 2018-04-17 DIAGNOSIS — H04123 Dry eye syndrome of bilateral lacrimal glands: Secondary | ICD-10-CM | POA: Diagnosis not present

## 2018-04-17 DIAGNOSIS — H47323 Drusen of optic disc, bilateral: Secondary | ICD-10-CM | POA: Diagnosis not present

## 2018-04-17 DIAGNOSIS — H40013 Open angle with borderline findings, low risk, bilateral: Secondary | ICD-10-CM | POA: Diagnosis not present

## 2018-04-25 DIAGNOSIS — H268 Other specified cataract: Secondary | ICD-10-CM | POA: Diagnosis not present

## 2018-04-25 DIAGNOSIS — H25811 Combined forms of age-related cataract, right eye: Secondary | ICD-10-CM | POA: Diagnosis not present

## 2018-05-12 DIAGNOSIS — N390 Urinary tract infection, site not specified: Secondary | ICD-10-CM | POA: Diagnosis not present

## 2018-05-16 DIAGNOSIS — H52202 Unspecified astigmatism, left eye: Secondary | ICD-10-CM | POA: Diagnosis not present

## 2018-05-16 DIAGNOSIS — H268 Other specified cataract: Secondary | ICD-10-CM | POA: Diagnosis not present

## 2018-05-16 DIAGNOSIS — H25812 Combined forms of age-related cataract, left eye: Secondary | ICD-10-CM | POA: Diagnosis not present

## 2018-06-05 DIAGNOSIS — N952 Postmenopausal atrophic vaginitis: Secondary | ICD-10-CM | POA: Diagnosis not present

## 2018-06-05 DIAGNOSIS — R350 Frequency of micturition: Secondary | ICD-10-CM | POA: Diagnosis not present

## 2018-06-05 DIAGNOSIS — N390 Urinary tract infection, site not specified: Secondary | ICD-10-CM | POA: Diagnosis not present

## 2018-07-10 DIAGNOSIS — Z6825 Body mass index (BMI) 25.0-25.9, adult: Secondary | ICD-10-CM | POA: Diagnosis not present

## 2018-07-10 DIAGNOSIS — Z01419 Encounter for gynecological examination (general) (routine) without abnormal findings: Secondary | ICD-10-CM | POA: Diagnosis not present

## 2018-07-10 DIAGNOSIS — Z1231 Encounter for screening mammogram for malignant neoplasm of breast: Secondary | ICD-10-CM | POA: Diagnosis not present

## 2018-10-05 DIAGNOSIS — F330 Major depressive disorder, recurrent, mild: Secondary | ICD-10-CM | POA: Diagnosis not present

## 2018-10-05 DIAGNOSIS — R5383 Other fatigue: Secondary | ICD-10-CM | POA: Diagnosis not present

## 2018-10-05 DIAGNOSIS — Z23 Encounter for immunization: Secondary | ICD-10-CM | POA: Diagnosis not present

## 2018-10-05 DIAGNOSIS — M5136 Other intervertebral disc degeneration, lumbar region: Secondary | ICD-10-CM | POA: Diagnosis not present

## 2018-10-09 ENCOUNTER — Ambulatory Visit: Payer: Medicare Other | Admitting: Family Medicine

## 2018-10-09 ENCOUNTER — Encounter: Payer: Self-pay | Admitting: Family Medicine

## 2018-10-09 VITALS — BP 134/92 | HR 76 | Ht 68.0 in | Wt 170.0 lb

## 2018-10-09 DIAGNOSIS — M25511 Pain in right shoulder: Secondary | ICD-10-CM

## 2018-10-09 DIAGNOSIS — G8929 Other chronic pain: Secondary | ICD-10-CM

## 2018-10-09 DIAGNOSIS — M67911 Unspecified disorder of synovium and tendon, right shoulder: Secondary | ICD-10-CM | POA: Diagnosis not present

## 2018-10-09 MED ORDER — DICLOFENAC SODIUM 1 % TD GEL: 2.0000 g | Freq: Four times a day (QID) | TRANSDERMAL | 1 refills | Status: DC

## 2018-10-09 MED ORDER — NITROGLYCERIN 0.2 MG/HR TD PT24: MEDICATED_PATCH | TRANSDERMAL | 1 refills | Status: DC

## 2018-10-09 NOTE — Progress Notes (Signed)
PCP: Laurann Montana, MD  Subjective:   HPI: Patient is a 68 y.o. female here for right shoulder pain.  Patient reports 3-8/10 shoulder pain.  She has had some degree of shoulder pain for the past 30-40 years following an injury in her 80s during which time she fell on outstretched hand resulting in a wrist fracture.  She localizes her pain to the anterior shoulder.  She reports pain with overhead movement such as swinging a tennis racquet.  She also reports pain and difficulty reaching behind her back.  Pain is also worse if laying on her right side.  She occasionally takes NSAIDs but these cause some GI upset.  Pain is improved with taking her arm and avoiding use.  She denies any distal numbness or tingling.  No swelling or erythema.  No skin changes.  Past Medical History:  Diagnosis Date  . Anxiety   . Depression   . Hypothyroidism   . Thyroid disease     Current Outpatient Medications on File Prior to Visit  Medication Sig Dispense Refill  . fluticasone (FLONASE) 50 MCG/ACT nasal spray   3  . gabapentin (NEURONTIN) 300 MG capsule Take 300 mg first day, then 300 mg BID for a day, then 300 mg TID afterwards. 30 capsule 0  . halobetasol (ULTRAVATE) 0.05 % ointment   0  . HYDROcodone-acetaminophen (NORCO/VICODIN) 5-325 MG tablet Take 1 tablet by mouth every 6 (six) hours as needed. 8 tablet 0  . levothyroxine (SYNTHROID, LEVOTHROID) 125 MCG tablet   1  . Multiple Vitamin (MULTIVITAMIN) tablet Take 1 tablet by mouth daily.    . valACYclovir (VALTREX) 1000 MG tablet Take 1 tablet (1,000 mg total) by mouth 3 (three) times daily. 21 tablet 0   No current facility-administered medications on file prior to visit.     Past Surgical History:  Procedure Laterality Date  . BACK SURGERY    . BUNIONECTOMY Right   . CESAREAN SECTION      Allergies  Allergen Reactions  . Flagyl [Metronidazole] Anaphylaxis  . Amoxicillin     rash  . Buspar [Buspirone]     Heart palpations  . Celebrex  [Celecoxib]     Chest pain  . Cymbalta [Duloxetine Hcl]     depression  . Gabapentin     Social History   Socioeconomic History  . Marital status: Married    Spouse name: Not on file  . Number of children: Not on file  . Years of education: Not on file  . Highest education level: Not on file  Occupational History  . Not on file  Social Needs  . Financial resource strain: Not on file  . Food insecurity:    Worry: Not on file    Inability: Not on file  . Transportation needs:    Medical: Not on file    Non-medical: Not on file  Tobacco Use  . Smoking status: Never Smoker  . Smokeless tobacco: Never Used  Substance and Sexual Activity  . Alcohol use: Yes    Comment: socially  . Drug use: No  . Sexual activity: Not on file  Lifestyle  . Physical activity:    Days per week: Not on file    Minutes per session: Not on file  . Stress: Not on file  Relationships  . Social connections:    Talks on phone: Not on file    Gets together: Not on file    Attends religious service: Not on file  Active member of club or organization: Not on file    Attends meetings of clubs or organizations: Not on file    Relationship status: Not on file  . Intimate partner violence:    Fear of current or ex partner: Not on file    Emotionally abused: Not on file    Physically abused: Not on file    Forced sexual activity: Not on file  Other Topics Concern  . Not on file  Social History Narrative  . Not on file    Family History  Problem Relation Age of Onset  . CAD Father   . Stroke Brother     BP (!) 134/92   Pulse 76   Ht 5\' 8"  (1.727 m)   Wt 170 lb (77.1 kg)   BMI 25.85 kg/m   Review of Systems: See HPI above.     Objective:  Physical Exam:  Gen: awake, alert, NAD, comfortable in exam room Pulm: breathing unlabored  Right shoulder: No obvious deformity or asymmetry. No bruising. No swelling Tenderness anteriorly around the bicipital groove Full ROM in flexion,  abduction, and external rotation.  She does have slightly reduced internal rotation compared to the left NV intact distally Special Tests:  - Impingement: Neg Hawkins and Neers.  - Supraspinatus: Negative empty can.  5/5 strength - Infraspinatus/Teres: 5/5 strength with ER - Subscapularis: Pain with belly press. 5/5 strength with IR - Biceps tendon: Slight pain with speeds.   Left shoulder: No tenderness at the bicipital groove Full range of motion without pain N/V intact distally 5/5 strength with rotator cuff testing  [facility] US: Limited ultrasound of the left shoulder performed.  The biceps tendon was visualized in long and short view.  No evidence of tears or tenosynovitis.  There is hypoechoic change seen within the subscapularis representative of chronic tendinopathy.  No tear is visualized   Assessment & Plan:  1.  Right shoulder pain- secondary to subscapularis tendinopathy - Home exercises with red Thera-Band - Voltaren gel 4 times daily - Nitroglycerin patch - Follow-up in 6 weeks

## 2018-10-09 NOTE — Patient Instructions (Signed)
You have subscapularis tendinopathy of your shoulder. Start home exercise program with theraband 3 sets of 10 once a day. Voltaren gel up to 4 times a day for pain and inflammation. Nitro patches 1/4th patch to affected area, change daily. Follow up with me in 6 weeks for reevaluation.

## 2018-10-12 ENCOUNTER — Encounter: Payer: Self-pay | Admitting: Family Medicine

## 2018-10-12 ENCOUNTER — Ambulatory Visit: Payer: Medicare Other | Admitting: Family Medicine

## 2018-10-12 VITALS — BP 132/88 | HR 80 | Ht 68.0 in | Wt 170.0 lb

## 2018-10-12 DIAGNOSIS — G8929 Other chronic pain: Secondary | ICD-10-CM | POA: Diagnosis not present

## 2018-10-12 DIAGNOSIS — M545 Low back pain, unspecified: Secondary | ICD-10-CM

## 2018-10-12 NOTE — Patient Instructions (Signed)
Your pain is due to arthritis of your back. These are the different medications you can take for this: Tylenol 500mg  1-2 tabs three times a day for pain. Capsaicin, aspercreme, or biofreeze topically up to four times a day may also help with pain. Some supplements that may help for arthritis: Boswellia extract, curcumin, pycnogenol Aleve 1-2 tabs twice a day with food Cortisone injections are an option but these didn't work for you previously. It's important that you continue to stay active. Do home exercises - you can experiment with those beyond the 3-5 we pointed out. Consider physical therapy. Shoe inserts with good arch support may be helpful. Heat 15 minutes at a time 3-4 times a day as needed to help with pain. Stay as active as possible Follow up with me in 1 month for this.

## 2018-10-13 ENCOUNTER — Encounter: Payer: Self-pay | Admitting: Family Medicine

## 2018-10-13 NOTE — Progress Notes (Signed)
PCP: Laurann Montana, MD  Subjective:   HPI: Patient is a 68 y.o. female here for back pain.  Patient reports several year history of pain in thoracic back wrapping around anteriorly as well as low back pain, sandbag-like feeling near very low back. Pain radiates into both hips. No groin pain. Difficulty picking up her legs sometimes. Pain 3/10 currently but can be worse and sharp. No bowel/bladder dysfunction. No numbness. Does home exercise program regularly.  Past Medical History:  Diagnosis Date  . Anxiety   . Depression   . Hypothyroidism   . Thyroid disease     Current Outpatient Medications on File Prior to Visit  Medication Sig Dispense Refill  . diclofenac sodium (VOLTAREN) 1 % GEL Apply 2 g topically 4 (four) times daily. 3 Tube 1  . fluticasone (FLONASE) 50 MCG/ACT nasal spray   3  . gabapentin (NEURONTIN) 300 MG capsule Take 300 mg first day, then 300 mg BID for a day, then 300 mg TID afterwards. 30 capsule 0  . levothyroxine (SYNTHROID, LEVOTHROID) 125 MCG tablet   1  . Multiple Vitamin (MULTIVITAMIN) tablet Take 1 tablet by mouth daily.    . nitroGLYCERIN (NITRODUR - DOSED IN MG/24 HR) 0.2 mg/hr patch Apply 1/4th patch to affected shoulder, change daily 30 patch 1   No current facility-administered medications on file prior to visit.     Past Surgical History:  Procedure Laterality Date  . BACK SURGERY    . BUNIONECTOMY Right   . CESAREAN SECTION      Allergies  Allergen Reactions  . Flagyl [Metronidazole] Anaphylaxis  . Amoxicillin     rash  . Buspar [Buspirone]     Heart palpations  . Celebrex [Celecoxib]     Chest pain  . Cymbalta [Duloxetine Hcl]     depression  . Gabapentin     Social History   Socioeconomic History  . Marital status: Married    Spouse name: Not on file  . Number of children: Not on file  . Years of education: Not on file  . Highest education level: Not on file  Occupational History  . Not on file  Social Needs  .  Financial resource strain: Not on file  . Food insecurity:    Worry: Not on file    Inability: Not on file  . Transportation needs:    Medical: Not on file    Non-medical: Not on file  Tobacco Use  . Smoking status: Never Smoker  . Smokeless tobacco: Never Used  Substance and Sexual Activity  . Alcohol use: Yes    Comment: socially  . Drug use: No  . Sexual activity: Not on file  Lifestyle  . Physical activity:    Days per week: Not on file    Minutes per session: Not on file  . Stress: Not on file  Relationships  . Social connections:    Talks on phone: Not on file    Gets together: Not on file    Attends religious service: Not on file    Active member of club or organization: Not on file    Attends meetings of clubs or organizations: Not on file    Relationship status: Not on file  . Intimate partner violence:    Fear of current or ex partner: Not on file    Emotionally abused: Not on file    Physically abused: Not on file    Forced sexual activity: Not on file  Other Topics Concern  .  Not on file  Social History Narrative  . Not on file    Family History  Problem Relation Age of Onset  . CAD Father   . Stroke Brother     BP 132/88   Pulse 80   Ht 5\' 8"  (1.727 m)   Wt 170 lb (77.1 kg)   BMI 25.85 kg/m   Review of Systems: See HPI above.     Objective:  Physical Exam:  Gen: NAD, comfortable in exam room  Back: No gross deformity, scoliosis. No paraspinal TTP .  No midline or bony TTP. FROM with mild pain on extension > flexion. Strength LEs 5/5 all muscle groups.   2+ MSRs in patellar and achilles tendons, equal bilaterally. Negative SLRs. Sensation intact to light touch bilaterally.  Bilateral hips: No deformity. FROM with 5/5 strength. No tenderness to palpation. NVI distally. Negative logroll bilateral hips Negative fabers and piriformis stretches.   Assessment & Plan:  1. Mid-low back pain - independently reviewed prior CT, MRIs.   Combined with current exam, history, this is consistent with arthritis as cause of her pain worst at L5-S1 level.  Shown home exercises to do daily - declined physical therapy for now.  Tylenol, topical medications, supplements reviewed, aleve.  Heat.  F/u in 1 month.

## 2018-10-27 DIAGNOSIS — M8580 Other specified disorders of bone density and structure, unspecified site: Secondary | ICD-10-CM | POA: Diagnosis not present

## 2018-10-27 DIAGNOSIS — E039 Hypothyroidism, unspecified: Secondary | ICD-10-CM | POA: Diagnosis not present

## 2018-11-20 ENCOUNTER — Ambulatory Visit: Payer: Medicare Other | Admitting: Family Medicine

## 2019-01-26 DIAGNOSIS — E039 Hypothyroidism, unspecified: Secondary | ICD-10-CM | POA: Diagnosis not present

## 2019-02-09 DIAGNOSIS — B029 Zoster without complications: Secondary | ICD-10-CM | POA: Diagnosis not present

## 2019-06-08 DIAGNOSIS — L819 Disorder of pigmentation, unspecified: Secondary | ICD-10-CM | POA: Diagnosis not present

## 2019-06-08 DIAGNOSIS — D225 Melanocytic nevi of trunk: Secondary | ICD-10-CM | POA: Diagnosis not present

## 2019-06-08 DIAGNOSIS — L821 Other seborrheic keratosis: Secondary | ICD-10-CM | POA: Diagnosis not present

## 2019-06-08 DIAGNOSIS — D2239 Melanocytic nevi of other parts of face: Secondary | ICD-10-CM | POA: Diagnosis not present

## 2019-07-18 DIAGNOSIS — R5383 Other fatigue: Secondary | ICD-10-CM | POA: Diagnosis not present

## 2019-07-18 DIAGNOSIS — L732 Hidradenitis suppurativa: Secondary | ICD-10-CM | POA: Diagnosis not present

## 2019-07-18 DIAGNOSIS — E559 Vitamin D deficiency, unspecified: Secondary | ICD-10-CM | POA: Diagnosis not present

## 2019-07-18 DIAGNOSIS — E039 Hypothyroidism, unspecified: Secondary | ICD-10-CM | POA: Diagnosis not present

## 2019-10-08 DIAGNOSIS — H52223 Regular astigmatism, bilateral: Secondary | ICD-10-CM | POA: Diagnosis not present

## 2019-10-23 DIAGNOSIS — R5383 Other fatigue: Secondary | ICD-10-CM | POA: Diagnosis not present

## 2019-10-23 DIAGNOSIS — E039 Hypothyroidism, unspecified: Secondary | ICD-10-CM | POA: Diagnosis not present

## 2019-10-23 DIAGNOSIS — Z23 Encounter for immunization: Secondary | ICD-10-CM | POA: Diagnosis not present

## 2019-10-23 DIAGNOSIS — H1010 Acute atopic conjunctivitis, unspecified eye: Secondary | ICD-10-CM | POA: Diagnosis not present

## 2019-11-05 DIAGNOSIS — B029 Zoster without complications: Secondary | ICD-10-CM | POA: Diagnosis not present

## 2019-11-14 DIAGNOSIS — Z1231 Encounter for screening mammogram for malignant neoplasm of breast: Secondary | ICD-10-CM | POA: Diagnosis not present

## 2019-11-14 DIAGNOSIS — R350 Frequency of micturition: Secondary | ICD-10-CM | POA: Diagnosis not present

## 2019-11-14 DIAGNOSIS — Z01419 Encounter for gynecological examination (general) (routine) without abnormal findings: Secondary | ICD-10-CM | POA: Diagnosis not present

## 2019-11-14 DIAGNOSIS — Z6826 Body mass index (BMI) 26.0-26.9, adult: Secondary | ICD-10-CM | POA: Diagnosis not present

## 2019-11-30 ENCOUNTER — Other Ambulatory Visit: Payer: Self-pay | Admitting: Orthopedic Surgery

## 2019-11-30 DIAGNOSIS — M545 Low back pain: Secondary | ICD-10-CM | POA: Diagnosis not present

## 2019-11-30 DIAGNOSIS — M5136 Other intervertebral disc degeneration, lumbar region: Secondary | ICD-10-CM | POA: Diagnosis not present

## 2019-11-30 DIAGNOSIS — M546 Pain in thoracic spine: Secondary | ICD-10-CM | POA: Diagnosis not present

## 2019-12-03 ENCOUNTER — Other Ambulatory Visit: Payer: Self-pay | Admitting: Orthopedic Surgery

## 2019-12-03 DIAGNOSIS — M546 Pain in thoracic spine: Secondary | ICD-10-CM

## 2020-06-10 DIAGNOSIS — L661 Lichen planopilaris: Secondary | ICD-10-CM | POA: Diagnosis not present

## 2020-06-10 DIAGNOSIS — L814 Other melanin hyperpigmentation: Secondary | ICD-10-CM | POA: Diagnosis not present

## 2020-06-10 DIAGNOSIS — L700 Acne vulgaris: Secondary | ICD-10-CM | POA: Diagnosis not present

## 2020-06-10 DIAGNOSIS — D225 Melanocytic nevi of trunk: Secondary | ICD-10-CM | POA: Diagnosis not present

## 2020-06-15 DIAGNOSIS — H109 Unspecified conjunctivitis: Secondary | ICD-10-CM | POA: Diagnosis not present

## 2020-07-07 DIAGNOSIS — H1032 Unspecified acute conjunctivitis, left eye: Secondary | ICD-10-CM | POA: Diagnosis not present

## 2020-07-07 DIAGNOSIS — H01004 Unspecified blepharitis left upper eyelid: Secondary | ICD-10-CM | POA: Diagnosis not present

## 2020-07-07 DIAGNOSIS — H1132 Conjunctival hemorrhage, left eye: Secondary | ICD-10-CM | POA: Diagnosis not present

## 2020-07-07 DIAGNOSIS — H0014 Chalazion left upper eyelid: Secondary | ICD-10-CM | POA: Diagnosis not present

## 2020-07-17 DIAGNOSIS — H01002 Unspecified blepharitis right lower eyelid: Secondary | ICD-10-CM | POA: Diagnosis not present

## 2020-07-17 DIAGNOSIS — H01005 Unspecified blepharitis left lower eyelid: Secondary | ICD-10-CM | POA: Diagnosis not present

## 2020-11-03 DIAGNOSIS — E785 Hyperlipidemia, unspecified: Secondary | ICD-10-CM | POA: Diagnosis not present

## 2020-11-03 DIAGNOSIS — Z7189 Other specified counseling: Secondary | ICD-10-CM | POA: Diagnosis not present

## 2020-11-03 DIAGNOSIS — E039 Hypothyroidism, unspecified: Secondary | ICD-10-CM | POA: Diagnosis not present

## 2020-11-03 DIAGNOSIS — E559 Vitamin D deficiency, unspecified: Secondary | ICD-10-CM | POA: Diagnosis not present

## 2020-12-09 DIAGNOSIS — L239 Allergic contact dermatitis, unspecified cause: Secondary | ICD-10-CM | POA: Diagnosis not present

## 2020-12-09 DIAGNOSIS — L661 Lichen planopilaris: Secondary | ICD-10-CM | POA: Diagnosis not present

## 2020-12-09 DIAGNOSIS — L298 Other pruritus: Secondary | ICD-10-CM | POA: Diagnosis not present

## 2020-12-09 DIAGNOSIS — L820 Inflamed seborrheic keratosis: Secondary | ICD-10-CM | POA: Diagnosis not present

## 2021-01-27 DIAGNOSIS — L668 Other cicatricial alopecia: Secondary | ICD-10-CM | POA: Diagnosis not present

## 2021-01-27 DIAGNOSIS — L661 Lichen planopilaris: Secondary | ICD-10-CM | POA: Diagnosis not present

## 2021-02-05 DIAGNOSIS — H109 Unspecified conjunctivitis: Secondary | ICD-10-CM | POA: Diagnosis not present

## 2021-02-18 DIAGNOSIS — Z6825 Body mass index (BMI) 25.0-25.9, adult: Secondary | ICD-10-CM | POA: Diagnosis not present

## 2021-02-18 DIAGNOSIS — Z01419 Encounter for gynecological examination (general) (routine) without abnormal findings: Secondary | ICD-10-CM | POA: Diagnosis not present

## 2021-02-18 DIAGNOSIS — Z1231 Encounter for screening mammogram for malignant neoplasm of breast: Secondary | ICD-10-CM | POA: Diagnosis not present

## 2021-02-26 DIAGNOSIS — H109 Unspecified conjunctivitis: Secondary | ICD-10-CM | POA: Diagnosis not present

## 2021-03-02 DIAGNOSIS — H0100B Unspecified blepharitis left eye, upper and lower eyelids: Secondary | ICD-10-CM | POA: Diagnosis not present

## 2021-03-02 DIAGNOSIS — H04123 Dry eye syndrome of bilateral lacrimal glands: Secondary | ICD-10-CM | POA: Diagnosis not present

## 2021-03-02 DIAGNOSIS — H0100A Unspecified blepharitis right eye, upper and lower eyelids: Secondary | ICD-10-CM | POA: Diagnosis not present

## 2021-03-04 DIAGNOSIS — H0100A Unspecified blepharitis right eye, upper and lower eyelids: Secondary | ICD-10-CM | POA: Diagnosis not present

## 2021-03-04 DIAGNOSIS — H0100B Unspecified blepharitis left eye, upper and lower eyelids: Secondary | ICD-10-CM | POA: Diagnosis not present

## 2021-04-13 DIAGNOSIS — M279 Disease of jaws, unspecified: Secondary | ICD-10-CM | POA: Diagnosis not present

## 2021-04-17 DIAGNOSIS — H0100B Unspecified blepharitis left eye, upper and lower eyelids: Secondary | ICD-10-CM | POA: Diagnosis not present

## 2021-04-17 DIAGNOSIS — H0100A Unspecified blepharitis right eye, upper and lower eyelids: Secondary | ICD-10-CM | POA: Diagnosis not present

## 2021-04-29 DIAGNOSIS — H524 Presbyopia: Secondary | ICD-10-CM | POA: Diagnosis not present

## 2021-05-18 DIAGNOSIS — H9201 Otalgia, right ear: Secondary | ICD-10-CM | POA: Diagnosis not present

## 2021-05-18 DIAGNOSIS — M26621 Arthralgia of right temporomandibular joint: Secondary | ICD-10-CM | POA: Diagnosis not present

## 2021-08-04 DIAGNOSIS — L814 Other melanin hyperpigmentation: Secondary | ICD-10-CM | POA: Diagnosis not present

## 2021-08-04 DIAGNOSIS — L590 Erythema ab igne [dermatitis ab igne]: Secondary | ICD-10-CM | POA: Diagnosis not present

## 2021-08-04 DIAGNOSIS — L820 Inflamed seborrheic keratosis: Secondary | ICD-10-CM | POA: Diagnosis not present

## 2021-08-04 DIAGNOSIS — D225 Melanocytic nevi of trunk: Secondary | ICD-10-CM | POA: Diagnosis not present

## 2021-09-18 DIAGNOSIS — J019 Acute sinusitis, unspecified: Secondary | ICD-10-CM | POA: Diagnosis not present

## 2021-09-24 DIAGNOSIS — H0100B Unspecified blepharitis left eye, upper and lower eyelids: Secondary | ICD-10-CM | POA: Diagnosis not present

## 2021-09-24 DIAGNOSIS — H10412 Chronic giant papillary conjunctivitis, left eye: Secondary | ICD-10-CM | POA: Diagnosis not present

## 2021-10-27 ENCOUNTER — Emergency Department (HOSPITAL_COMMUNITY): Payer: Medicare Other

## 2021-10-27 ENCOUNTER — Encounter (HOSPITAL_COMMUNITY): Payer: Self-pay

## 2021-10-27 ENCOUNTER — Emergency Department (HOSPITAL_COMMUNITY)
Admission: EM | Admit: 2021-10-27 | Discharge: 2021-10-27 | Disposition: A | Discharge status: Home / Self Care | Payer: Medicare Other | Attending: Emergency Medicine | Admitting: Emergency Medicine

## 2021-10-27 DIAGNOSIS — R42 Dizziness and giddiness: Secondary | ICD-10-CM | POA: Diagnosis not present

## 2021-10-27 DIAGNOSIS — H53143 Visual discomfort, bilateral: Secondary | ICD-10-CM | POA: Diagnosis not present

## 2021-10-27 DIAGNOSIS — R2689 Other abnormalities of gait and mobility: Secondary | ICD-10-CM | POA: Diagnosis not present

## 2021-10-27 LAB — BASIC METABOLIC PANEL
Anion gap: 9 (ref 5–15)
BUN: 15 mg/dL (ref 8–23)
CO2: 22 mmol/L (ref 22–32)
Calcium: 9.2 mg/dL (ref 8.9–10.3)
Chloride: 107 mmol/L (ref 98–111)
Creatinine, Ser: 0.58 mg/dL (ref 0.44–1.00)
GFR, Estimated: >60 mL/min (ref >60)
Glucose, Bld: 98 mg/dL (ref 70–99)
Potassium: 3.8 mmol/L (ref 3.5–5.1)
Sodium: 138 mmol/L (ref 135–145)

## 2021-10-27 LAB — CBC WITH DIFFERENTIAL/PLATELET
Abs Immature Granulocytes: 0.01 10*3/uL (ref 0.00–0.07)
Basophils Absolute: 0 10*3/uL (ref 0.0–0.1)
Basophils Relative: 1 %
Eosinophils Absolute: 0 10*3/uL (ref 0.0–0.5)
Eosinophils Relative: 1 %
HCT: 41.3 % (ref 36.0–46.0)
Hemoglobin: 14 g/dL (ref 12.0–15.0)
Immature Granulocytes: 0 %
Lymphocytes Relative: 35 %
Lymphs Abs: 1.7 10*3/uL (ref 0.7–4.0)
MCH: 31.6 pg (ref 26.0–34.0)
MCHC: 33.9 g/dL (ref 30.0–36.0)
MCV: 93.2 fL (ref 80.0–100.0)
Monocytes Absolute: 0.5 10*3/uL (ref 0.1–1.0)
Monocytes Relative: 11 %
Neutro Abs: 2.6 10*3/uL (ref 1.7–7.7)
Neutrophils Relative %: 52 %
Platelets: 259 10*3/uL (ref 150–400)
RBC: 4.43 MIL/uL (ref 3.87–5.11)
RDW: 12.6 % (ref 11.5–15.5)
WBC: 4.9 10*3/uL (ref 4.0–10.5)
nRBC: 0 % (ref 0.0–0.2)

## 2021-10-27 MED ORDER — CYCLOBENZAPRINE HCL 10 MG PO TABS: 10.0000 mg | ORAL_TABLET | Freq: Three times a day (TID) | ORAL | 0 refills | Status: DC | PRN

## 2021-10-27 NOTE — ED Triage Notes (Signed)
Pt arrived via POV, c/o "swirling in brain" very dizzy and sensitive to light since 3 pm today.

## 2021-10-27 NOTE — Discharge Instructions (Signed)
Recommend following up with your primary care doctor to discuss your episode of dizziness today.  If you feel you are having worsening dizziness, any new vision change, speech change, numbness, weakness or other new concerning symptom, come back to ER for reassessment.

## 2021-10-27 NOTE — ED Provider Triage Note (Signed)
Emergency Medicine Provider Triage Evaluation Note  [name] , a 71 y.o. female  was evaluated in triage.  Pt complains of dizziness and not feeling well.  Has had persistent congestion and rhinorrhea since January.  Was told she needed follow-up with ENT.  Last week was diagnosed with a cracked tooth.  Yesterday felt "off" she thought this was due to a probable dental infection.  She was on her way to go pick up the antibiotic when she stood to get out of her car she noticed everything was spinning.  She has never had vertigo.  Has had a generalized headache and photophobia which began earlier today, unknown exact onset..  No numbness, weakness, slurred speech.  She was seen at urgent care sent here for further evaluation.  No meds PTA.  She denies sudden onset thunderclap headache, neck pain or stiffness  Review of Systems  Positive: Dizziness, headache, photophobia Negative: Numbness, weakness  Physical Exam  BP (!) 148/96 (BP Location: Left Arm)    Pulse 80    Temp 98 F (36.7 C) (Oral)    Resp 16    SpO2 99%  Gen:   Awake, no distress   Resp:  Normal effort  MSK:   Moves extremities without difficulty  Neuro:  CN 2-12 grossly intact, equal strength, intact sensation, unable to assess for nystagmus due to patient closing eyes. Other:    Medical Decision Making  Medically screening exam initiated at 6:12 PM.  Appropriate orders placed.  Winfield Cunas was informed that the remainder of the evaluation will be completed by another provider, this initial triage assessment does not replace that evaluation, and the importance of remaining in the ED until their evaluation is complete.  Headache, dizziness  Patient outside code stroke window, no symptoms consistent with LVO.   Kadey Mihalic A, PA-C 10/27/21 1814

## 2021-10-27 NOTE — ED Provider Notes (Addendum)
[facility] DEPT Provider Note   CSN: [id number] Arrival date & time: 10/27/21  1741     History  Chief Complaint  Patient presents with   Dizziness    [name] is a 71 y.o. female.  Presenting to the emergency department with concern for dizziness.  Reports that she had dizziness that started relatively suddenly this afternoon.  Occurred when getting out of her car, had stood up suddenly.  Room spinning sensation.  This episode seemed to last for a couple hours.  In this time she went to her primary care office.  Advised to come to ER for further evaluation.  Patient states that since coming to ER her symptoms have actually resolved.  She does not have any ongoing dizziness at present.  Has been able to walk now without difficulty.  She denies any associated numbness, weakness, speech change.  She did note a few days ago she thought she had an episode of brief blurriness in her vision but has not had any visual changes today.  States that she has suffered from long time of tooth/jaw discomfort.  Seen recently by her dentist and is on antibiotics. Clindamycin.  HPI     Home Medications Prior to Admission medications   Medication Sig Start Date End Date Taking? Authorizing Provider  cyclobenzaprine (FLEXERIL) 10 MG tablet Take 1 tablet (10 mg total) by mouth 3 (three) times daily as needed for muscle spasms. 10/27/21  Yes Lucrezia Starch, MD  diclofenac sodium (VOLTAREN) 1 % GEL Apply 2 g topically 4 (four) times daily. 10/09/18   Dene Gentry, MD  fluticasone (FLONASE) 50 MCG/ACT nasal spray  04/07/17   [provider]  gabapentin (NEURONTIN) 300 MG capsule Take 300 mg first day, then 300 mg BID for a day, then 300 mg TID afterwards. 08/22/17   Drenda Freeze, MD  levothyroxine Wilmer Floor, LEVOTHROID) 125 MCG tablet  02/11/17   [provider]  Multiple Vitamin (MULTIVITAMIN) tablet Take 1 tablet by mouth daily.    [provider]  nitroGLYCERIN (NITRODUR - DOSED IN MG/24 HR) 0.2 mg/hr patch Apply 1/4th patch to affected shoulder, change daily 10/09/18   Hudnall, Sharyn Lull, MD      Allergies    Flagyl [metronidazole], Amoxicillin, Buspar [buspirone], Celebrex [celecoxib], Cymbalta [duloxetine hcl], and Gabapentin    Review of Systems   Review of Systems  Constitutional:  Negative for chills and fever.  HENT:  Positive for dental problem. Negative for ear pain and sore throat.   Eyes:  Negative for pain and visual disturbance.  Respiratory:  Negative for cough and shortness of breath.   Cardiovascular:  Negative for chest pain and palpitations.  Gastrointestinal:  Negative for abdominal pain and vomiting.  Genitourinary:  Negative for dysuria and hematuria.  Musculoskeletal:  Negative for arthralgias and back pain.  Skin:  Negative for color change and rash.  Neurological:  Positive for dizziness. Negative for seizures and syncope.  All other systems reviewed and are negative.  Physical Exam Updated Vital Signs BP (!) 144/95    Pulse 80    Temp 98 F (36.7 C) (Oral)    Resp 14    SpO2 99%  Physical Exam Vitals and nursing note reviewed.  Constitutional:      General: She is not in acute distress.    Appearance: She is well-developed.  HENT:     Head: Normocephalic and atraumatic.     Right Ear: Tympanic membrane, ear  canal and external ear normal.     Left Ear: Tympanic membrane, ear canal and external ear normal.     Nose: Nose normal.  Eyes:     Conjunctiva/sclera: Conjunctivae normal.  Cardiovascular:     Rate and Rhythm: Normal rate and regular rhythm.     Heart sounds: No murmur heard. Pulmonary:     Effort: Pulmonary effort is normal. No respiratory distress.     Breath sounds: Normal breath sounds.  Abdominal:     Palpations: Abdomen is soft.     Tenderness: There is no abdominal tenderness.  Musculoskeletal:        General: No swelling or tenderness.     Cervical back: Neck  supple.  Skin:    General: Skin is warm and dry.     Capillary Refill: Capillary refill takes less than 2 seconds.  Neurological:     Mental Status: She is alert.     Comments: AAOx3 CN 2-12 intact, speech clear visual fields intact 5/5 strength in b/l UE and LE Sensation to light touch intact in b/l UE and LE Normal FNF  Psychiatric:        Mood and Affect: Mood normal.    ED Results / Procedures / Treatments   Labs (all labs ordered are listed, but only abnormal results are displayed) Labs Reviewed  BASIC METABOLIC PANEL  CBC WITH DIFFERENTIAL/PLATELET  CBC WITH DIFFERENTIAL/PLATELET    EKG EKG Interpretation  Date/Time:  Tuesday October 27 2021 21:52:07 EST Ventricular Rate:  82 PR Interval:  174 QRS Duration: 78 QT Interval:  387 QTC Calculation: 452 R Axis:   62 Text Interpretation: Sinus rhythm Confirmed by Madalyn Rob [phone]) on 10/27/2021 10:23:55 PM  Radiology CT HEAD WO CONTRAST (5MM)  Result Date: 10/27/2021 CLINICAL DATA:  Dizziness EXAM: CT HEAD WITHOUT CONTRAST TECHNIQUE: Contiguous axial images were obtained from the base of the skull through the vertex without intravenous contrast. RADIATION DOSE REDUCTION: This exam was performed according to the departmental dose-optimization program which includes automated exposure control, adjustment of the mA and/or kV according to patient size and/or use of iterative reconstruction technique. COMPARISON:  None. FINDINGS: Brain: No acute intracranial findings are seen. Ventricles are not dilated. There is no shift of midline structures. There are no epidural or subdural fluid collections. Vascular: Unremarkable. Skull: Unremarkable. Sinuses/Orbits: Unremarkable. Other: None IMPRESSION: No acute intracranial findings are seen in noncontrast CT brain. Electronically Signed   By: Elmer Picker M.D.   On: 10/27/2021 18:44    Procedures Procedures    Medications Ordered in ED Medications - No data to  display  ED Course/ Medical Decision Making/ A&P                           Medical Decision Making Risk Prescription drug management.   71 year old lady presented to ER with concern for dizziness.  Describes room spinning sensation.  On exam she currently appears well in no distress.  She has a normal neurologic exam.  CT head negative. I independently reviewed CT imaging. Based on the positional nature and the fact that there is no associated neurologic deficit, suspect most likely benign paroxysmal positional vertigo and very low suspicion for central etiology at present such as stroke.  Given resolution of vertigo and normal neuro exam and negative head CT, do not feel patient needs MRI tonight.  Basic labs are stable.  No electrolyte derangement or anemia. EKG without acute changes. Recommend she  follow-up with primary care doctor, discharged home.  Pt friend at bedside updated throughout stay in addition to pt.     After the discussed management above, the patient was determined to be safe for discharge.  The patient was in agreement with this plan and all questions regarding their care were answered.  ED return precautions were discussed and the patient will return to the ED with any significant worsening of condition.      Final Clinical Impression(s) / ED Diagnoses Final diagnoses:  Vertigo    Rx / DC Orders ED Discharge Orders          Ordered    cyclobenzaprine (FLEXERIL) 10 MG tablet  3 times daily PRN        10/27/21 2309              Lucrezia Starch, MD 10/28/21 0010    Lucrezia Starch, MD 10/28/21 [phone]

## 2021-10-28 DIAGNOSIS — H8110 Benign paroxysmal vertigo, unspecified ear: Secondary | ICD-10-CM | POA: Diagnosis not present

## 2021-10-28 DIAGNOSIS — H53149 Visual discomfort, unspecified: Secondary | ICD-10-CM | POA: Diagnosis not present

## 2021-10-28 DIAGNOSIS — E039 Hypothyroidism, unspecified: Secondary | ICD-10-CM | POA: Diagnosis not present

## 2021-10-29 DIAGNOSIS — H0100B Unspecified blepharitis left eye, upper and lower eyelids: Secondary | ICD-10-CM | POA: Diagnosis not present

## 2021-10-29 DIAGNOSIS — R519 Headache, unspecified: Secondary | ICD-10-CM | POA: Diagnosis not present

## 2021-10-29 DIAGNOSIS — H5713 Ocular pain, bilateral: Secondary | ICD-10-CM | POA: Diagnosis not present

## 2021-11-05 DIAGNOSIS — H5713 Ocular pain, bilateral: Secondary | ICD-10-CM | POA: Diagnosis not present

## 2021-11-05 DIAGNOSIS — H0100B Unspecified blepharitis left eye, upper and lower eyelids: Secondary | ICD-10-CM | POA: Diagnosis not present

## 2021-11-24 ENCOUNTER — Encounter: Payer: Medicare Other | Admitting: Physical Therapy

## 2021-11-24 DIAGNOSIS — R262 Difficulty in walking, not elsewhere classified: Secondary | ICD-10-CM | POA: Diagnosis not present

## 2021-11-24 DIAGNOSIS — R2681 Unsteadiness on feet: Secondary | ICD-10-CM | POA: Diagnosis not present

## 2021-11-24 DIAGNOSIS — R42 Dizziness and giddiness: Secondary | ICD-10-CM | POA: Diagnosis not present

## 2021-11-26 DIAGNOSIS — R2681 Unsteadiness on feet: Secondary | ICD-10-CM | POA: Diagnosis not present

## 2021-11-26 DIAGNOSIS — R42 Dizziness and giddiness: Secondary | ICD-10-CM | POA: Diagnosis not present

## 2021-11-26 DIAGNOSIS — R262 Difficulty in walking, not elsewhere classified: Secondary | ICD-10-CM | POA: Diagnosis not present

## 2021-12-01 DIAGNOSIS — R42 Dizziness and giddiness: Secondary | ICD-10-CM | POA: Diagnosis not present

## 2021-12-01 DIAGNOSIS — R2681 Unsteadiness on feet: Secondary | ICD-10-CM | POA: Diagnosis not present

## 2021-12-01 DIAGNOSIS — R262 Difficulty in walking, not elsewhere classified: Secondary | ICD-10-CM | POA: Diagnosis not present

## 2021-12-03 DIAGNOSIS — R2681 Unsteadiness on feet: Secondary | ICD-10-CM | POA: Diagnosis not present

## 2021-12-03 DIAGNOSIS — R262 Difficulty in walking, not elsewhere classified: Secondary | ICD-10-CM | POA: Diagnosis not present

## 2021-12-03 DIAGNOSIS — R42 Dizziness and giddiness: Secondary | ICD-10-CM | POA: Diagnosis not present

## 2021-12-07 DIAGNOSIS — R2681 Unsteadiness on feet: Secondary | ICD-10-CM | POA: Diagnosis not present

## 2021-12-07 DIAGNOSIS — H9201 Otalgia, right ear: Secondary | ICD-10-CM | POA: Diagnosis not present

## 2021-12-07 DIAGNOSIS — R42 Dizziness and giddiness: Secondary | ICD-10-CM | POA: Diagnosis not present

## 2021-12-07 DIAGNOSIS — R519 Headache, unspecified: Secondary | ICD-10-CM | POA: Diagnosis not present

## 2021-12-07 DIAGNOSIS — R262 Difficulty in walking, not elsewhere classified: Secondary | ICD-10-CM | POA: Diagnosis not present

## 2021-12-15 DIAGNOSIS — R262 Difficulty in walking, not elsewhere classified: Secondary | ICD-10-CM | POA: Diagnosis not present

## 2021-12-15 DIAGNOSIS — R42 Dizziness and giddiness: Secondary | ICD-10-CM | POA: Diagnosis not present

## 2021-12-15 DIAGNOSIS — R2681 Unsteadiness on feet: Secondary | ICD-10-CM | POA: Diagnosis not present

## 2021-12-17 DIAGNOSIS — R2681 Unsteadiness on feet: Secondary | ICD-10-CM | POA: Diagnosis not present

## 2021-12-17 DIAGNOSIS — R262 Difficulty in walking, not elsewhere classified: Secondary | ICD-10-CM | POA: Diagnosis not present

## 2021-12-17 DIAGNOSIS — R42 Dizziness and giddiness: Secondary | ICD-10-CM | POA: Diagnosis not present

## 2021-12-23 ENCOUNTER — Telehealth: Payer: Self-pay | Admitting: *Deleted

## 2021-12-23 ENCOUNTER — Other Ambulatory Visit (INDEPENDENT_AMBULATORY_CARE_PROVIDER_SITE_OTHER): Payer: Self-pay

## 2021-12-23 ENCOUNTER — Ambulatory Visit: Payer: Medicare Other | Admitting: Neurology

## 2021-12-23 ENCOUNTER — Encounter: Payer: Self-pay | Admitting: Neurology

## 2021-12-23 DIAGNOSIS — R42 Dizziness and giddiness: Secondary | ICD-10-CM

## 2021-12-23 DIAGNOSIS — R519 Headache, unspecified: Secondary | ICD-10-CM | POA: Diagnosis not present

## 2021-12-23 DIAGNOSIS — Z0289 Encounter for other administrative examinations: Secondary | ICD-10-CM

## 2021-12-23 MED ORDER — NURTEC 75 MG PO TBDP: ORAL_TABLET | ORAL | 5 refills | Status: DC

## 2021-12-23 MED ORDER — TOPIRAMATE 100 MG PO TABS: 100.0000 mg | ORAL_TABLET | Freq: Two times a day (BID) | ORAL | 6 refills | Status: DC

## 2021-12-23 MED ORDER — TIZANIDINE HCL 4 MG PO TABS: 4.0000 mg | ORAL_TABLET | Freq: Four times a day (QID) | ORAL | 3 refills | Status: DC | PRN

## 2021-12-23 NOTE — Telephone Encounter (Signed)
Patient prescribed Nurtec 75mg  at today's visit for acute migraine treatment. She has pharmacy coverage through [employer] [phone]). Her plan requires a trial with inadequate response to sumatriptan or rizatriptan.  ?

## 2021-12-23 NOTE — Progress Notes (Signed)
? ?Chief Complaint  ?Patient presents with  ? New Patient (Initial Visit)  ?  Rm 15. Accompanied by friend, Romie Minus. ?NP/[employer]/[facility]/Cynthia White MD/recurrent severe dizziness.  ? ? ? ? ?ASSESSMENT AND PLAN ? ?[name] is a 71 y.o. female ? ?Acute onset of dizziness with moderate to severe prolonged headaches, light sensitivity, nauseous ? Nonfocal neurological examination ? CT head without contrast was normal ? Most suggestive of migraine variant ? I performed nerve block/trigger point with 0.25% Marcaine at today's visit, she reported mild improvement, ? This was followed by IV Depacon infusion 1000 mcg, Compazine 10 mg, and Toradol 30 mg, she reported significant improvement after that, and also drowsiness, was driven by her friends ? Laboratory evaluations ESR C-reactive protein to rule out temporal arteritis ?MRI of brain ?Topamax 100 mg twice a day as migraine prevention ?Nurtec as needed as abortive treatment, may combine with Zofran, Aleve ? ? ?DIAGNOSTIC DATA (LABS, IMAGING, TESTING) ?- I reviewed patient records, labs, notes, testing and imaging myself where available. ? ?Laboratory evaluation in February 2023, normal TSH, triglyceride was 82, HDL was 64, CBC Hg 14.2, CMP creat 0.74, Vit D 63.5 ? ?MEDICAL HISTORY: ? ?[name], is a 71 year old female seen in request by her primary care physician Dr. Harlan Stains, for evaluation of dizziness, headaches, initial evaluation was on December 23, 2021 ? ?I reviewed and summarized the referring note. PMHX. ?Hypothyroidism ?Depression,  ?Lumbar decompression surgery in 2013. ? ?She had her first onset of dizziness on October 27, 2021, she stepped out of the car turning towards the left, then had sudden onset dizziness, felt her whole brain was in rotation, her neighbor was able to help drive her back home, she was able to walk into her house, symptoms persist and few hours later, she was evaluated at emergency room ? ?Personally  reviewed CT head without contrast, no acute intracranial abnormality noted ? ?But her dizziness sensation, later light sensitivity headache persistent for few days, she was able to see by her ophthalmologist, there was no significant abnormality noted, her symptoms gradually improved after few days ? ?She went to vestibular rehab in March 2023, overall has much improved ? ?About a week ago, she began to experience similar sensation again, holoacranial headaches, unsteady sensation, extreme light sensitivity, neck muscle tension, also retro-orbital area pain, ? ?She tried naproxen with limited help, also have nausea, she denies difficulty talking, using her arms and legs ? ?She denies a history of typical migraine in the past, ? ?PHYSICAL EXAM:   ?134/78, HR 85 ? ?PHYSICAL EXAMNIATION: ? ?Gen: NAD, conversant, well nourised, well groomed                     ?Cardiovascular: Regular rate rhythm, no peripheral edema, warm, nontender. ?Eyes: Conjunctivae clear without exudates or hemorrhage ?Neck: Supple, no carotid bruits. ?Pulmonary: Clear to auscultation bilaterally  ? ?NEUROLOGICAL EXAM: ? ?MENTAL STATUS: In  acute distress, wearing sunglasses ?Speech/cognition: Awake, alert, oriented to history taking and casual conversation ?CRANIAL NERVES: ?CN II: Visual fields are full to confrontation. Pupils are round equal and briskly reactive to light. ?CN III, IV, VI: extraocular movement are normal. No ptosis. ?CN V: Facial sensation is intact to light touch ?CN VII: Face is symmetric with normal eye closure  ?CN VIII: Hearing is normal to causal conversation. ?CN IX, X: Phonation is normal. ?CN XI: Head turning and shoulder shrug are intact ? ?MOTOR: ?There is no pronator drift of  out-stretched arms. Muscle bulk and tone are normal. Muscle strength is normal. ? ?REFLEXES: ?Reflexes are 2+ and symmetric at the biceps, triceps, knees, and ankles. Plantar responses are flexor. ? ?SENSORY: ?Intact to light touch, pinprick and  vibratory sensation are intact in fingers and toes. ? ?COORDINATION: ?There is no trunk or limb dysmetria noted. ? ?GAIT/STANCE: Need push-up to get up from seated position, cautious mildly unsteady ? ?REVIEW OF SYSTEMS:  ?Full 14 system review of systems performed and notable only for as above ?All other review of systems were negative. ? ? ?ALLERGIES: ?Allergies  ?Allergen Reactions  ? Flagyl [Metronidazole] Anaphylaxis  ? Hydrocodone-Acetaminophen   ?  Other reaction(s): Confusion, Confusion (intolerance)  ? Tramadol Nausea Only and Nausea And Vomiting  ? Alendronate   ?  Other reaction(s): Other (See Comments)  ? Amoxicillin   ?  rash  ? Azithromycin   ?  Other reaction(s): Other (See Comments)  ? Buspar [Buspirone]   ?  Heart palpations  ? Buspirone Hcl   ?  Other reaction(s): Irregular Heart Rate  ? Celebrex [Celecoxib]   ?  Chest pain  ? Cymbalta [Duloxetine Hcl]   ?  depression  ? Duloxetine   ?  Other reaction(s): Other (See Comments)  ? Estradiol   ?  Other reaction(s): Other (See Comments)  ? Gabapentin   ? Hydroxychloroquine   ?  Other reaction(s): Other (See Comments)  ? Meloxicam Nausea Only  ? ? ?HOME MEDICATIONS: ?Current Outpatient Medications  ?Medication Sig Dispense Refill  ? diphenhydrAMINE (BENADRYL) 25 MG tablet Take 25 mg by mouth every 6 (six) hours as needed.    ? halobetasol (ULTRAVATE) 0.05 % cream Apply topically 2 (two) times daily.    ? Levothyroxine Sodium 137 MCG CAPS   1  ? NAPROXEN PO Take by mouth.    ? UNABLE TO FIND Med Name: Pseudoephedrine HCL    ? ?No current facility-administered medications for this visit.  ? ? ?PAST MEDICAL HISTORY: ?Past Medical History:  ?Diagnosis Date  ? Anxiety   ? Depression   ? Hypothyroidism   ? Thyroid disease   ? ? ?PAST SURGICAL HISTORY: ?Past Surgical History:  ?Procedure Laterality Date  ? BACK SURGERY    ? BUNIONECTOMY Right   ? CESAREAN SECTION    ? ? ?FAMILY HISTORY: ?Family History  ?Problem Relation Age of Onset  ? CAD Father   ? Stroke  Brother   ? ? ?SOCIAL HISTORY: ?Social History  ? ?Socioeconomic History  ? Marital status: Divorced  ?  Spouse name: Not on file  ? Number of children: Not on file  ? Years of education: Not on file  ? Highest education level: Not on file  ?Occupational History  ? Not on file  ?Tobacco Use  ? Smoking status: Never  ? Smokeless tobacco: Never  ?Substance and Sexual Activity  ? Alcohol use: Yes  ?  Comment: socially  ? Drug use: No  ? Sexual activity: Not on file  ?Other Topics Concern  ? Not on file  ?Social History Narrative  ? Not on file  ? ?Social Determinants of Health  ? ?Financial Resource Strain: Not on file  ?Food Insecurity: Not on file  ?Transportation Needs: Not on file  ?Physical Activity: Not on file  ?Stress: Not on file  ?Social Connections: Not on file  ?Intimate Partner Violence: Not on file  ? ? ? ? ?Marcial Pacas, M.D. Ph.D. ? ?[facility] ?[address] ?  [place], [place] [address] ?Ph: [phone]) [phone] ?Fax: [phone] ? ?CC:  Harlan Stains, MD ?[address] ?[address] ?[place],  [place] [address]  Harlan Stains, MD   ?

## 2021-12-23 NOTE — Patient Instructions (Addendum)
Meds ordered this encounter  ?Medications  ? Rimegepant Sulfate (NURTEC) 75 MG TBDP  ?  Sig: Take 1 tab at onset of migraine.  May repeat in 2 hrs, if needed.  Max dose: 2 tabs/day. This is a 30 day prescription.  ?  Dispense:  12 tablet  ?  Refill:  5  ? tiZANidine (ZANAFLEX) 4 MG tablet  ?  Sig: Take 1 tablet (4 mg total) by mouth every 6 (six) hours as needed for muscle spasms.  ?  Dispense:  30 tablet  ?  Refill:  3  ? topiramate (TOPAMAX) 100 MG tablet--as preventive medications  ?  Sig: Take 1 tablet (100 mg total) by mouth 2 (two) times daily.  ? ?You may combine Nurtec as needed, mixed with tizanidine ( muscle relaxant), zofran ( for nausea), aleve for recurrent headaches, vertigo ?

## 2021-12-24 ENCOUNTER — Telehealth: Payer: Self-pay | Admitting: Neurology

## 2021-12-24 LAB — C-REACTIVE PROTEIN: CRP: 209 mg/L — ABNORMAL HIGH (ref 0–10)

## 2021-12-24 LAB — SEDIMENTATION RATE: Sed Rate: 48 mm/hr — ABNORMAL HIGH (ref 0–40)

## 2021-12-24 MED ORDER — BUTALBITAL-APAP-CAFFEINE 50-325-40 MG PO TABS: 1.0000 | ORAL_TABLET | Freq: Two times a day (BID) | ORAL | 0 refills | Status: DC | PRN

## 2021-12-24 NOTE — Addendum Note (Signed)
Addended by: Levert Feinstein on: 12/24/2021 04:09 PM ? ? Modules accepted: Orders ? ?

## 2021-12-24 NOTE — Addendum Note (Signed)
Addended by: Lindell Spar C on: 12/24/2021 01:11 PM ? ? Modules accepted: Orders ? ?

## 2021-12-24 NOTE — Telephone Encounter (Addendum)
Called [employer] spoke with Ed M he states PA is not required for MRI WO Contrast CPT code 48016. However the MRI needs to be done before 12/27/2021 or else a PA will be required. Sent order to the ladies at [facility] for patient to be scheduled. Reference # for the call [id number]. ?

## 2021-12-24 NOTE — Telephone Encounter (Signed)
Please check on her headache today.  ? ?I will try triptan after MRI brain. ? ?If needed, may bridge with fioricet ?

## 2021-12-24 NOTE — Telephone Encounter (Addendum)
I spoke to the patient. She woke up this morning with a residual headache that she rates at 5 on the 1-10 pain scale. Also, experiencing light sensitivity. At 10am, she took one tablet of topiramate 100mg  and one tablet of tizanidine 4mg . She is now having spinning dizziness, bizarre sensations in head, brain fog, dry mouth ("This must be what people feel like when they are high"). Symptoms severe enough that she considered calling 911. She laid down, feeling only mildly better now. Likely side effects from combination of taking two new medications together. ? ?Per vo by Dr , can hold off on further doses of topiramate for now. She will provide #12 tabs of Fioricet to treat acute headache. She would like the patient to have MRI brain asap. ? ?She can also use the tizanidine (even at half tab to err on side of caution).  ?

## 2021-12-24 NOTE — Telephone Encounter (Signed)
I have attempted to call the patient three times. Left detailed message with the plan as outlined below.  ? ?1) MRI brain asap ?2) Hold off on topiramate ?3) Fiorcet prn, as prescribed ?4) tizanidine prn (may even use half tab) ? ?[facility] has attempted to reach the patient. Left message for her to call back to schedule her MRI brain.  ? ?I also called and left this information on her dgt-in-law's voicemail (ok per DPR).  ?

## 2021-12-24 NOTE — Telephone Encounter (Signed)
Left message for patient to return my call. I also left message on her dgt-in-law's number (ok per DPR). ?

## 2021-12-25 ENCOUNTER — Emergency Department (HOSPITAL_BASED_OUTPATIENT_CLINIC_OR_DEPARTMENT_OTHER)
Admission: EM | Admit: 2021-12-25 | Discharge: 2021-12-26 | Disposition: A | Discharge status: Home / Self Care | Payer: Medicare Other | Attending: Emergency Medicine | Admitting: Emergency Medicine

## 2021-12-25 ENCOUNTER — Emergency Department (HOSPITAL_BASED_OUTPATIENT_CLINIC_OR_DEPARTMENT_OTHER): Payer: Medicare Other

## 2021-12-25 ENCOUNTER — Other Ambulatory Visit: Payer: Self-pay

## 2021-12-25 ENCOUNTER — Encounter (HOSPITAL_BASED_OUTPATIENT_CLINIC_OR_DEPARTMENT_OTHER): Payer: Self-pay

## 2021-12-25 DIAGNOSIS — Z79899 Other long term (current) drug therapy: Secondary | ICD-10-CM | POA: Insufficient documentation

## 2021-12-25 DIAGNOSIS — E039 Hypothyroidism, unspecified: Secondary | ICD-10-CM | POA: Diagnosis not present

## 2021-12-25 DIAGNOSIS — N12 Tubulo-interstitial nephritis, not specified as acute or chronic: Secondary | ICD-10-CM

## 2021-12-25 DIAGNOSIS — R42 Dizziness and giddiness: Secondary | ICD-10-CM | POA: Diagnosis not present

## 2021-12-25 DIAGNOSIS — N111 Chronic obstructive pyelonephritis: Secondary | ICD-10-CM | POA: Diagnosis not present

## 2021-12-25 DIAGNOSIS — R1031 Right lower quadrant pain: Secondary | ICD-10-CM | POA: Diagnosis not present

## 2021-12-25 DIAGNOSIS — R109 Unspecified abdominal pain: Secondary | ICD-10-CM | POA: Diagnosis not present

## 2021-12-25 LAB — URINALYSIS, ROUTINE W REFLEX MICROSCOPIC
Glucose, UA: NEGATIVE mg/dL
Ketones, ur: 40 mg/dL — AB
Nitrite: NEGATIVE
Protein, ur: 30 mg/dL — AB
Specific Gravity, Urine: 1.02 (ref 1.005–1.030)
pH: 6 (ref 5.0–8.0)

## 2021-12-25 LAB — CBC
HCT: 37.4 % (ref 36.0–46.0)
Hemoglobin: 12.5 g/dL (ref 12.0–15.0)
MCH: 30.5 pg (ref 26.0–34.0)
MCHC: 33.4 g/dL (ref 30.0–36.0)
MCV: 91.2 fL (ref 80.0–100.0)
Platelets: 321 10*3/uL (ref 150–400)
RBC: 4.1 MIL/uL (ref 3.87–5.11)
RDW: 12.9 % (ref 11.5–15.5)
WBC: 10.5 10*3/uL (ref 4.0–10.5)
nRBC: 0 % (ref 0.0–0.2)

## 2021-12-25 LAB — COMPREHENSIVE METABOLIC PANEL
ALT: 50 U/L — ABNORMAL HIGH (ref 0–44)
AST: 32 U/L (ref 15–41)
Albumin: 3.4 g/dL — ABNORMAL LOW (ref 3.5–5.0)
Alkaline Phosphatase: 329 U/L — ABNORMAL HIGH (ref 38–126)
Anion gap: 11 (ref 5–15)
BUN: 18 mg/dL (ref 8–23)
CO2: 24 mmol/L (ref 22–32)
Calcium: 9.7 mg/dL (ref 8.9–10.3)
Chloride: 99 mmol/L (ref 98–111)
Creatinine, Ser: 0.8 mg/dL (ref 0.44–1.00)
GFR, Estimated: >60 mL/min (ref >60)
Glucose, Bld: 91 mg/dL (ref 70–99)
Potassium: 3.3 mmol/L — ABNORMAL LOW (ref 3.5–5.1)
Sodium: 134 mmol/L — ABNORMAL LOW (ref 135–145)
Total Bilirubin: 0.9 mg/dL (ref 0.3–1.2)
Total Protein: 6.9 g/dL (ref 6.5–8.1)

## 2021-12-25 LAB — LIPASE, BLOOD: Lipase: <10 U/L — ABNORMAL LOW (ref 11–51)

## 2021-12-25 MED ORDER — FENTANYL CITRATE PF 50 MCG/ML IJ SOSY
25.0000 ug | PREFILLED_SYRINGE | Freq: Once | INTRAMUSCULAR | Status: AC
  Administered 2021-12-25: 25 ug via INTRAVENOUS
  Filled 2021-12-25: qty 1

## 2021-12-25 MED ORDER — IOHEXOL 300 MG/ML  SOLN
100.0000 mL | Freq: Once | INTRAMUSCULAR | Status: AC | PRN
  Administered 2021-12-25: 100 mL via INTRAVENOUS

## 2021-12-25 NOTE — ED Triage Notes (Signed)
C/O right lower abdominal pain x 2 weeks; c/o vertigo and "optical migraines"; [place], LBM 4 days ago.  ?

## 2021-12-26 MED ORDER — CIPROFLOXACIN IN D5W 400 MG/200ML IV SOLN
400.0000 mg | Freq: Once | INTRAVENOUS | Status: AC
  Administered 2021-12-26: 400 mg via INTRAVENOUS
  Filled 2021-12-26: qty 200

## 2021-12-26 MED ORDER — PROMETHAZINE HCL 12.5 MG PO TABS: 12.5000 mg | ORAL_TABLET | Freq: Three times a day (TID) | ORAL | 0 refills | Status: DC | PRN

## 2021-12-26 MED ORDER — CIPROFLOXACIN HCL 500 MG PO TABS: 500.0000 mg | ORAL_TABLET | Freq: Two times a day (BID) | ORAL | 0 refills | Status: DC

## 2021-12-26 NOTE — ED Provider Notes (Signed)
?[facility] EMERGENCY DEPT ?Provider Note ? ? ?CSN: [id number] ?Arrival date & time: 12/25/21  1706 ? ?  ? ?History ? ?Chief Complaint  ?Patient presents with  ? Abdominal Pain  ? Headache  ? Dizziness  ? ? ?[name] is a 71 y.o. female. ? ?The history is provided by the patient and a friend.  ?Abdominal Pain ?Pain location:  RLQ ?Pain radiates to:  Does not radiate ?Pain severity:  Moderate ?Onset quality:  Gradual ?Duration:  2 weeks ?Timing:  Constant ?Progression:  Worsening ?Chronicity:  New ?Relieved by:  Nothing ?Worsened by:  Nothing ?Associated symptoms: fatigue   ?Associated symptoms: no chest pain, no dysuria and no fever   ?Headache ?Associated symptoms: abdominal pain, dizziness and fatigue   ?Associated symptoms: no fever   ?Dizziness ?Associated symptoms: headaches   ?Associated symptoms: no chest pain   ?Patient history of anxiety, hypothyroid, 19 drug allergies presents with multiple complaints.  Patient reports she has been having abdominal pain for up to 2 weeks.  It is in the right lower quadrant she had associated nausea and vomiting.  She went to the [place] walk-in clinic and was told to go the ER for an acute abdomen. ? ?She also reports headache and vertigo since February 23.  She has been seen by neurology and  has an outpatient MRI scheduled ?This is similar to prior episodes.  No focal weakness.  No new vision changes.  No previous history of stroke ?  ?Past Medical History:  ?Diagnosis Date  ? Anxiety   ? Depression   ? Hypothyroidism   ? Thyroid disease   ? ? ?Home Medications ?Prior to Admission medications   ?Medication Sig Start Date End Date Taking? Authorizing Provider  ?ciprofloxacin (CIPRO) 500 MG tablet Take 1 tablet (500 mg total) by mouth 2 (two) times daily. 12/26/21  Yes Zadie Rhine, MD  ?promethazine (PHENERGAN) 12.5 MG tablet Take 1 tablet (12.5 mg total) by mouth every 8 (eight) hours as needed for nausea or vomiting. 12/26/21  Yes Zadie Rhine,  MD  ?butalbital-acetaminophen-caffeine (FIORICET) [phone] MG tablet Take 1 tablet by mouth every 12 (twelve) hours as needed for headache. 12/24/21 12/24/22  Levert Feinstein, MD  ?diphenhydrAMINE (BENADRYL) 25 MG tablet Take 25 mg by mouth every 6 (six) hours as needed.    [provider]  ?halobetasol (ULTRAVATE) 0.05 % cream Apply topically 2 (two) times daily.    [provider]  ?Levothyroxine Sodium 137 MCG CAPS  02/11/17   [provider]  ?NAPROXEN PO Take by mouth.    [provider]  ?Rimegepant Sulfate (NURTEC) 75 MG TBDP Take 1 tab at onset of migraine.  May repeat in 2 hrs, if needed.  Max dose: 2 tabs/day. This is a 30 day prescription. 12/23/21   Levert Feinstein, MD  ?tiZANidine (ZANAFLEX) 4 MG tablet Take 1 tablet (4 mg total) by mouth every 6 (six) hours as needed for muscle spasms. 12/23/21   Levert Feinstein, MD  ?topiramate (TOPAMAX) 100 MG tablet Take 1 tablet (100 mg total) by mouth 2 (two) times daily. 12/23/21   Levert Feinstein, MD  ?Ardelia Mems TO FIND Med Name: Pseudoephedrine HCL    [provider]  ?   ? ?Allergies    ?Flagyl [metronidazole], Hydrocodone-acetaminophen, Tramadol, Alendronate, Amoxicillin, Azithromycin, Buspar [buspirone], Buspirone hcl, Celebrex [celecoxib], Cymbalta [duloxetine hcl], Duloxetine, Estradiol, Gabapentin, Hydroxychloroquine, Meloxicam, Tizanidine, and Topiramate   ? ?Review of Systems   ?Review of Systems  ?Constitutional:  Positive for  fatigue. Negative for fever.  ?Eyes:  Negative for visual disturbance.  ?Cardiovascular:  Negative for chest pain.  ?Gastrointestinal:  Positive for abdominal pain.  ?Genitourinary:  Negative for dysuria.  ?Neurological:  Positive for dizziness and headaches.  ? ?Physical Exam ?Updated Vital Signs ?BP 133/78   Pulse 79   Temp 99.2 ?F (37.3 ?C)   Resp 17   Ht 1.727 m (5\' 8" )   Wt 70.8 kg   SpO2 97%   BMI 23.72 kg/m?  ?Physical Exam ?CONSTITUTIONAL: Elderly, resting comfortably with sunglasses on and a  washcloth on her forehead in a dark room ?HEAD: Normocephalic/atraumatic ?EYES: EOMI/PERRL, no nystagmus, no ptosis ?ENMT: Mucous membranes moist ?NECK: supple no meningeal signs, no bruits ?SPINE/BACK:entire spine nontender ?CV: S1/S2 noted, no murmurs/rubs/gallops noted ?LUNGS: Lungs are clear to auscultation bilaterally, no apparent distress ?ABDOMEN: soft, nontender, no rebound or guarding ?GU:no cva tenderness ?NEURO:Awake/alert, face symmetric, no arm or leg drift is noted ?Equal 5/5 strength with shoulder abduction, elbow flex/extension, wrist flex/extension in upper extremities and equal hand grips bilaterally ?Equal 5/5 strength with hip flexion,knee flex/extension, foot dorsi/plantar flexion ?Cranial nerves 3/4/5/6/03/14/09/11/12 tested and intact ?No past pointing ?Sensation to light touch intact in all extremities ?Patient stood up and started gyrating back-and-forth and refused to walk ?EXTREMITIES: pulses normal, full ROM ?SKIN: warm, color normal ?PSYCH anxious ? ?ED Results / Procedures / Treatments   ?Labs ?(all labs ordered are listed, but only abnormal results are displayed) ?Labs Reviewed  ?LIPASE, BLOOD - Abnormal; Notable for the following components:  ?    Result Value  ? Lipase <10 (*)   ? All other components within normal limits  ?COMPREHENSIVE METABOLIC PANEL - Abnormal; Notable for the following components:  ? Sodium 134 (*)   ? Potassium 3.3 (*)   ? Albumin 3.4 (*)   ? ALT 50 (*)   ? Alkaline Phosphatase 329 (*)   ? All other components within normal limits  ?URINALYSIS, ROUTINE W REFLEX MICROSCOPIC - Abnormal; Notable for the following components:  ? APPearance HAZY (*)   ? Hgb urine dipstick TRACE (*)   ? Bilirubin Urine SMALL (*)   ? Ketones, ur 40 (*)   ? Protein, ur 30 (*)   ? Leukocytes,Ua LARGE (*)   ? Bacteria, UA MANY (*)   ? All other components within normal limits  ?URINE CULTURE  ?CBC  ? ? ?EKG ?EKG Interpretation ? ?Date/Time:  Friday December 25 2021 23:40:25 EDT ?Ventricular  Rate:  85 ?PR Interval:  176 ?QRS Duration: 85 ?QT Interval:  374 ?QTC Calculation: 445 ?R Axis:   72 ?Text Interpretation: Sinus rhythm Ventricular premature complex Confirmed by Zadie RhineWickline, Garth Diffley ([id number]) on 12/26/2021 12:00:47 AM ? ?Radiology ?CT ABDOMEN PELVIS W CONTRAST ? ?Result Date: 12/26/2021 ?CLINICAL DATA:  Right lower abdominal pain EXAM: CT ABDOMEN AND PELVIS WITH CONTRAST TECHNIQUE: Multidetector CT imaging of the abdomen and pelvis was performed using the standard protocol following bolus administration of intravenous contrast. RADIATION DOSE REDUCTION: This exam was performed according to the departmental dose-optimization program which includes automated exposure control, adjustment of the mA and/or kV according to patient size and/or use of iterative reconstruction technique. CONTRAST:  100mL OMNIPAQUE IOHEXOL 300 MG/ML  SOLN COMPARISON:  None. FINDINGS: Lower chest: Lung bases demonstrate no acute consolidation or effusion. Normal cardiac size. Hepatobiliary: No focal liver abnormality is seen. No gallstones, gallbladder wall thickening, or biliary dilatation. Pancreas: Unremarkable. No pancreatic ductal dilatation or surrounding inflammatory changes. Spleen: Normal in size without focal  abnormality. Adrenals/Urinary Tract: Adrenal glands are within normal limits. Mild right perinephric fat stranding. Mild urothelial enhancement of the right renal pelvis. Heterogenous appearance of the bilateral kidneys on delayed views with mildly striated nephrogram. Simple cyst upper pole right kidney, no follow-up imaging is recommended. The bladder is normal Stomach/Bowel: Stomach is within normal limits. Appendix appears normal. No evidence of bowel wall thickening, distention, or inflammatory changes. Diverticular disease of the left colon. Vascular/Lymphatic: Mild atherosclerosis. No aneurysm. No suspicious lymph nodes Reproductive: Enhancing lesion at the uterine fundus likely a small fibroid. No adnexal mass  Other: Negative for pelvic effusion or free air Musculoskeletal: Degenerative changes at L5-S1. No acute osseous abnormality IMPRESSION: 1. Mild right perinephric fat stranding with urothelial enhancement of right

## 2021-12-27 ENCOUNTER — Telehealth: Payer: Self-pay | Admitting: Neurology

## 2021-12-27 NOTE — Telephone Encounter (Signed)
Please call patient for significantly elevated C-reactive protein 209, ESR of 48, ? ?Abnormal liver functional test on April 21, elevated alkaline phosphate, ALT 50 ? ?Normal CBC hemoglobin of 12.5, UA showed large leukocyte, many bacteria, ?Urine culture was positive for E. Coli ? ?She was treated at emergency room on December 25, 2021 with Cipro 500 mg twice a day, ? ?Please check on her headache, she is on schedule for MRI Jan 04, 2022, please move up her appointment ? ?I also ordered repeat CMP, ESR, C-reactive protein, she may come to our office for repeat lab without appointment ?

## 2021-12-27 NOTE — Progress Notes (Signed)
? ?  History: 71 year old female presented with few days history of extreme light sensitivity, nausea, dizziness sensation, moderate to severe headaches ? ? ?Bilateral occipital and temporal parietal region, upper trapezius, cervical paraspinal muscle trigger point injection for intractable headache ? ?Bupivacaine 0.25% was injected on the scalp bilaterally at several locations: ? ?-On the occipital area of the head, 3 injections each side, 0.5 cc per injection at the midpoint between the mastoid process and the occipital protuberance. 2 other injections were done one finger breadth from the initial injection, one at a 10 o'clock position and the other at a 2 o'clock position. ? ?-2 injections of 0.5 cc were done in the temporal regions, 2 fingerbreadths above the tragus of the ear, with the second injection one fingerbreadth posteriorly to the first. ? ?-2 injections were done on the brow, 1 in the medial brow and one over the supraorbital nerve notch, with 0.1 cc for each injection ? ? ?-0.5 cc was injected into bilateral upper trapezius and bilateral upper cervical paraspinals ? ? ?The patient tolerated the injections well, no complications of the procedure were noted. Injections were made with a 27-gauge needle. ? ?  ? ?

## 2021-12-28 ENCOUNTER — Other Ambulatory Visit (INDEPENDENT_AMBULATORY_CARE_PROVIDER_SITE_OTHER): Payer: Self-pay

## 2021-12-28 ENCOUNTER — Other Ambulatory Visit: Payer: Self-pay | Admitting: *Deleted

## 2021-12-28 DIAGNOSIS — R42 Dizziness and giddiness: Secondary | ICD-10-CM

## 2021-12-28 DIAGNOSIS — R799 Abnormal finding of blood chemistry, unspecified: Secondary | ICD-10-CM

## 2021-12-28 DIAGNOSIS — R519 Headache, unspecified: Secondary | ICD-10-CM

## 2021-12-28 DIAGNOSIS — Z0289 Encounter for other administrative examinations: Secondary | ICD-10-CM

## 2021-12-28 LAB — URINE CULTURE: Culture: >=100000 — AB

## 2021-12-28 MED ORDER — ALPRAZOLAM 1 MG PO TABS: ORAL_TABLET | ORAL | 0 refills | Status: DC

## 2021-12-28 NOTE — Telephone Encounter (Signed)
I spoke to the patient. She will come in this week for repeat CMP, ESR and CRP.  ? ?She continues to have a headache but the severity has lessened. She has been using Fioricet prn when the pain worsens and it has been beneficial.  ? ? Imaging is working on getting her MRI appt moved to an earlier date. The patient is aware to expect a call from them.  ? ?She is asking for a sedative for the MRI scan. She has taken alprazolam in the past and tolerates it well. Allergies confirmed. She verbalized understanding that a driver will be required. Rx sent in per MRI protocol.  ?

## 2021-12-28 NOTE — Addendum Note (Signed)
Addended by: Levert Feinstein on: 12/28/2021 01:17 PM ? ? Modules accepted: Orders ? ?

## 2021-12-28 NOTE — Telephone Encounter (Signed)
[employer] Medicare req an auth as of 12/28/21- it is pending with [employer] ?

## 2021-12-28 NOTE — Telephone Encounter (Signed)
Patient MRI is scheduled at GI for 12/30/21.  ?

## 2021-12-28 NOTE — Addendum Note (Signed)
Addended by: Lilla Shook on: 12/28/2021 09:44 AM ? ? Modules accepted: Orders ? ?

## 2021-12-28 NOTE — Telephone Encounter (Signed)
Patient is scheduled at GI for 01/04/22, I have sent Rinaldo Cloud and [place]ig with GI a message about moving the patient appointment up.  ?

## 2021-12-29 ENCOUNTER — Telehealth: Payer: Self-pay

## 2021-12-29 NOTE — Telephone Encounter (Signed)
Post ED Visit - Positive Culture Follow-up ? ?Culture report reviewed by antimicrobial stewardship pharmacist: ?[facility] ?[x]  , Daylene Posey.D. ?[]  [address], Pharm.D., BCPS AQ-ID ?[]  , Pharm.D., BCPS ?[]  Celedonio Miyamoto, .D., BCPS ?[]  [place], .D., BCPS, AAHIVP ?[]  Georgina Pillion, Pharm.D., BCPS, AAHIVP ?[]  [address], PharmD, BCPS ?[]  , PharmD, BCPS ?[]  [place], PharmD, BCPS ?[]  [address], PharmD ?[]  , PharmD, BCPS ?[]  Estella Husk, PharmD ? ? [facility] ?[]  Lysle Pearl, PharmD ?[]  , PharmD ?[]  Phillips Climes, PharmD ?[]  , Rph ?[]  Agapito Games) , PharmD ?[]  Verlan Friends, PharmD ?[]  , PharmD ?[]  Mervyn Gay, PharmD ?[]  , PharmD ?[]  Vinnie Level, PharmD ?[]  Gerri Spore, PharmD ?[]  , PharmD ?[]  Len Childs, PharmD ? ? ?Positive urine culture ?Treated with Ciprofloxacin HCL, organism sensitive to the same and no further patient follow-up is required at this time. ? ? ?12/29/2021, 10:51 AM ?  ?

## 2021-12-30 ENCOUNTER — Telehealth: Payer: Self-pay | Admitting: Neurology

## 2021-12-30 ENCOUNTER — Ambulatory Visit
Admission: RE | Admit: 2021-12-30 | Discharge: 2021-12-30 | Disposition: A | Discharge status: Home / Self Care | Payer: Medicare Other | Source: Ambulatory Visit | Attending: Neurology | Admitting: Neurology

## 2021-12-30 DIAGNOSIS — R519 Headache, unspecified: Secondary | ICD-10-CM

## 2021-12-30 DIAGNOSIS — R42 Dizziness and giddiness: Secondary | ICD-10-CM | POA: Diagnosis not present

## 2021-12-30 LAB — COMPREHENSIVE METABOLIC PANEL
ALT: 43 IU/L — ABNORMAL HIGH (ref 0–32)
AST: 34 IU/L (ref 0–40)
Albumin/Globulin Ratio: 1.1 — ABNORMAL LOW (ref 1.2–2.2)
Albumin: 3.3 g/dL — ABNORMAL LOW (ref 3.8–4.8)
Alkaline Phosphatase: 330 IU/L — ABNORMAL HIGH (ref 44–121)
BUN/Creatinine Ratio: 19 (ref 12–28)
BUN: 12 mg/dL (ref 8–27)
Bilirubin Total: 0.4 mg/dL (ref 0.0–1.2)
CO2: 20 mmol/L (ref 20–29)
Calcium: 8.9 mg/dL (ref 8.7–10.3)
Chloride: 104 mmol/L (ref 96–106)
Creatinine, Ser: 0.63 mg/dL (ref 0.57–1.00)
Globulin, Total: 2.9 g/dL (ref 1.5–4.5)
Glucose: 114 mg/dL — ABNORMAL HIGH (ref 70–99)
Potassium: 3.8 mmol/L (ref 3.5–5.2)
Sodium: 138 mmol/L (ref 134–144)
Total Protein: 6.2 g/dL (ref 6.0–8.5)
eGFR: 97 mL/min/{1.73_m2} (ref >59)

## 2021-12-30 LAB — C-REACTIVE PROTEIN: CRP: 100 mg/L — ABNORMAL HIGH (ref 0–10)

## 2021-12-30 LAB — SEDIMENTATION RATE: Sed Rate: 102 mm/hr — ABNORMAL HIGH (ref 0–40)

## 2021-12-30 NOTE — Telephone Encounter (Signed)
repeat ESR, C-reactive protein remain elevated, elevated alkaline phosphate, mild elevation of ALT ? ?She was treated for E. coli urinary tract infection on December 26, 2021, ? ?Will follow up MRI brain report ? ?

## 2021-12-31 ENCOUNTER — Telehealth: Payer: Self-pay | Admitting: Neurology

## 2021-12-31 MED ORDER — BUTALBITAL-APAP-CAFFEINE 50-325-40 MG PO TABS: 1.0000 | ORAL_TABLET | Freq: Two times a day (BID) | ORAL | 0 refills | Status: DC | PRN

## 2021-12-31 NOTE — Telephone Encounter (Signed)
Pt called wanting to know that even though provider here did not prescribe the ciprofloxacin (CIPRO) 500 MG tablet she would like to know if she should refill it due to the results of her last labs drawn here. She would also like to know if her butalbital-acetaminophen-caffeine (FIORICET) 50-325-40 MG tablet can be called in to the [facility] [address] [facility] not [facility] and she has never used it.  ?

## 2021-12-31 NOTE — Telephone Encounter (Signed)
I spoke to the patient. Provided her with the MRI brain results. Scheduled a visit w/ NP. She has called her PCP today for UTI follow up. ?

## 2021-12-31 NOTE — Telephone Encounter (Signed)
BCBS medicare Berkley Harvey: [id number] (exp. 12/28/21 to 01/26/22) patient had MRI at GI on 12/30/21.  ?

## 2021-12-31 NOTE — Telephone Encounter (Signed)
Please call patient, MRI of the brain showed no acute abnormality, small vessel disease most consistent with migraine headaches, ? ?Brief follow-up with her primary care to ensure complete treatment of her UTI ? ?Give her follow-up visit with nurse practitioner in 4 to 6 weeks ? ? ?IMPRESSION: This MRI of the brain without contrast shows the following: ?1.  No acute findings. ?2.  Multiple T2/FLAIR hyperintense foci in the subcortical and deep white matter.  This is nonspecific but most consistent with mild to moderate chronic microvascular ischemic change or sequela of migraine..  The pattern is not typical for demyelination.  None of the foci appear to be acute. ?  ?

## 2021-12-31 NOTE — Telephone Encounter (Signed)
Please approve the attached Fiorcet prescription (pt needs at different pharmacy - [facility]). ? ?[facility] ? ?She was being prescribed Cipro from an E. coli urinary tract infection. She is feeling better now but uneasy about coming to the end of her prescription. Feels she needs to be re-checked. I instructed her to contact her PCP for follow up. ? ?MRI brain completed 12/30/21. Results still pending. We will contact her after read and MD has reviewed.  ?

## 2022-01-01 ENCOUNTER — Emergency Department (HOSPITAL_BASED_OUTPATIENT_CLINIC_OR_DEPARTMENT_OTHER)
Admission: EM | Admit: 2022-01-01 | Discharge: 2022-01-01 | Disposition: A | Discharge status: Home / Self Care | Payer: Medicare Other | Attending: Emergency Medicine | Admitting: Emergency Medicine

## 2022-01-01 ENCOUNTER — Encounter (HOSPITAL_BASED_OUTPATIENT_CLINIC_OR_DEPARTMENT_OTHER): Payer: Self-pay | Admitting: Emergency Medicine

## 2022-01-01 ENCOUNTER — Other Ambulatory Visit: Payer: Self-pay

## 2022-01-01 DIAGNOSIS — R42 Dizziness and giddiness: Secondary | ICD-10-CM | POA: Diagnosis not present

## 2022-01-01 DIAGNOSIS — R51 Headache with orthostatic component, not elsewhere classified: Secondary | ICD-10-CM | POA: Insufficient documentation

## 2022-01-01 DIAGNOSIS — R531 Weakness: Secondary | ICD-10-CM | POA: Diagnosis present

## 2022-01-01 DIAGNOSIS — I959 Hypotension, unspecified: Secondary | ICD-10-CM | POA: Diagnosis not present

## 2022-01-01 DIAGNOSIS — R9431 Abnormal electrocardiogram [ECG] [EKG]: Secondary | ICD-10-CM | POA: Diagnosis not present

## 2022-01-01 DIAGNOSIS — N12 Tubulo-interstitial nephritis, not specified as acute or chronic: Secondary | ICD-10-CM | POA: Diagnosis not present

## 2022-01-01 DIAGNOSIS — R748 Abnormal levels of other serum enzymes: Secondary | ICD-10-CM | POA: Diagnosis not present

## 2022-01-01 LAB — CBC WITH DIFFERENTIAL/PLATELET
Abs Immature Granulocytes: 0.09 10*3/uL — ABNORMAL HIGH (ref 0.00–0.07)
Basophils Absolute: 0.1 10*3/uL (ref 0.0–0.1)
Basophils Relative: 1 %
Eosinophils Absolute: 0.1 10*3/uL (ref 0.0–0.5)
Eosinophils Relative: 2 %
HCT: 38.5 % (ref 36.0–46.0)
Hemoglobin: 12.9 g/dL (ref 12.0–15.0)
Immature Granulocytes: 1 %
Lymphocytes Relative: 21 %
Lymphs Abs: 1.5 10*3/uL (ref 0.7–4.0)
MCH: 30.5 pg (ref 26.0–34.0)
MCHC: 33.5 g/dL (ref 30.0–36.0)
MCV: 91 fL (ref 80.0–100.0)
Monocytes Absolute: 1.1 10*3/uL — ABNORMAL HIGH (ref 0.1–1.0)
Monocytes Relative: 16 %
Neutro Abs: 4.3 10*3/uL (ref 1.7–7.7)
Neutrophils Relative %: 59 %
Platelets: 519 10*3/uL — ABNORMAL HIGH (ref 150–400)
RBC: 4.23 MIL/uL (ref 3.87–5.11)
RDW: 12.6 % (ref 11.5–15.5)
WBC: 7.2 10*3/uL (ref 4.0–10.5)
nRBC: 0 % (ref 0.0–0.2)

## 2022-01-01 LAB — COMPREHENSIVE METABOLIC PANEL
ALT: 37 U/L (ref 0–44)
AST: 25 U/L (ref 15–41)
Albumin: 3.7 g/dL (ref 3.5–5.0)
Alkaline Phosphatase: 185 U/L — ABNORMAL HIGH (ref 38–126)
Anion gap: 11 (ref 5–15)
BUN: 12 mg/dL (ref 8–23)
CO2: 24 mmol/L (ref 22–32)
Calcium: 9.8 mg/dL (ref 8.9–10.3)
Chloride: 105 mmol/L (ref 98–111)
Creatinine, Ser: 0.61 mg/dL (ref 0.44–1.00)
GFR, Estimated: >60 mL/min (ref >60)
Glucose, Bld: 80 mg/dL (ref 70–99)
Potassium: 3.9 mmol/L (ref 3.5–5.1)
Sodium: 140 mmol/L (ref 135–145)
Total Bilirubin: 0.5 mg/dL (ref 0.3–1.2)
Total Protein: 7 g/dL (ref 6.5–8.1)

## 2022-01-01 MED ORDER — SODIUM CHLORIDE 0.9 % IV BOLUS
1000.0000 mL | Freq: Once | INTRAVENOUS | Status: AC
  Administered 2022-01-01: 1000 mL via INTRAVENOUS

## 2022-01-01 MED ORDER — PROCHLORPERAZINE EDISYLATE 10 MG/2ML IJ SOLN
10.0000 mg | Freq: Once | INTRAMUSCULAR | Status: AC
  Administered 2022-01-01: 10 mg via INTRAVENOUS
  Filled 2022-01-01: qty 2

## 2022-01-01 MED ORDER — DIPHENHYDRAMINE HCL 25 MG PO CAPS
25.0000 mg | ORAL_CAPSULE | Freq: Once | ORAL | Status: AC
  Administered 2022-01-01: 25 mg via ORAL
  Filled 2022-01-01: qty 1

## 2022-01-01 MED ORDER — KETOROLAC TROMETHAMINE 15 MG/ML IJ SOLN
15.0000 mg | Freq: Once | INTRAMUSCULAR | Status: AC
  Administered 2022-01-01: 15 mg via INTRAVENOUS
  Filled 2022-01-01: qty 1

## 2022-01-01 NOTE — ED Provider Notes (Signed)
?[facility] DEPT ?Provider Note ? ? ?CSN: [id number] ?Arrival date & time: 01/01/22  [id number] ? ?  ? ?History ? ?Chief Complaint  ?Patient presents with  ? Weakness  ? ? ?[name] is a 71 y.o. female. ? ?Patient sent here from primary care doctor's office because of low blood pressure.  Patient was there to have her urine rechecked after recent urine infection.  She was told that the urine does not look infected.  She also has been dealing with ongoing migraines.  She had an MRI this past week that was unremarkable.  She follows with neurology for this.  She has been taking Topamax but does not like how it makes her feel.  She is also been taking Fioricet.  Patient denies any black or bloody stools.  Denies any fevers or chills.  Denies any nausea or vomiting. ? ? ? ?  ? ?Home Medications ?Prior to Admission medications   ?Medication Sig Start Date End Date Taking? Authorizing Provider  ?ALPRAZolam (XANAX) 1 MG tablet Take 1-2 tablets thirty minutes prior to MRI.  May take one additional tablet before entering scanner, if needed.  MUST HAVE DRIVER. [id number]   Marcial Pacas, MD  ?butalbital-acetaminophen-caffeine [facility]) [phone] MG tablet Take 1 tablet by mouth every 12 (twelve) hours as needed for headache. 12/31/21 12/31/22  Marcial Pacas, MD  ?ciprofloxacin (CIPRO) 500 MG tablet Take 1 tablet (500 mg total) by mouth 2 (two) times daily. 12/26/21   Ripley Fraise, MD  ?diphenhydrAMINE (BENADRYL) 25 MG tablet Take 25 mg by mouth every 6 (six) hours as needed.    [provider]  ?halobetasol (ULTRAVATE) 0.05 % cream Apply topically 2 (two) times daily.    [provider]  ?Levothyroxine Sodium 137 MCG CAPS  02/11/17   [provider]  ?NAPROXEN PO Take by mouth.    [provider]  ?promethazine (PHENERGAN) 12.5 MG tablet Take 1 tablet (12.5 mg total) by mouth every 8 (eight) hours as needed for nausea or vomiting. 12/26/21   Ripley Fraise, MD  ?Rimegepant  Sulfate (NURTEC) 75 MG TBDP Take 1 tab at onset of migraine.  May repeat in 2 hrs, if needed.  Max dose: 2 tabs/day. This is a 30 day prescription. 12/23/21   Marcial Pacas, MD  ?topiramate (TOPAMAX) 100 MG tablet Take 1 tablet (100 mg total) by mouth 2 (two) times daily. 12/23/21   Marcial Pacas, MD  ?Karen Kays TO FIND Med Name: Pseudoephedrine HCL    [provider]  ?   ? ?Allergies    ?Flagyl [metronidazole], Hydrocodone-acetaminophen, Tramadol, Alendronate, Amoxicillin, Azithromycin, Buspar [buspirone], Buspirone hcl, Celebrex [celecoxib], Cymbalta [duloxetine hcl], Duloxetine, Estradiol, Gabapentin, Hydroxychloroquine, Meloxicam, Tizanidine, and Topiramate   ? ?Review of Systems   ?Review of Systems ? ?Physical Exam ?Updated Vital Signs ? ?ED Triage Vitals  ?Enc Vitals Group  ?   BP 01/01/22 0945 121/78  ?   Pulse Rate 01/01/22 0945 77  ?   Resp 01/01/22 0945 18  ?   Temp 01/01/22 0945 97.9 ?F (36.6 ?C)  ?   Temp Source 01/01/22 0945 Oral  ?   SpO2 01/01/22 0945 98 %  ?   Weight --   ?   Height --   ?   Head Circumference --   ?   Peak Flow --   ?   Pain Score 01/01/22 0946 4  ?   Pain Loc --   ?   Pain Edu? --   ?  Excl. in [place]? --   ?  ?Physical Exam ?Vitals and nursing note reviewed.  ?Constitutional:   ?   General: She is not in acute distress. ?   Appearance: She is well-developed. She is not ill-appearing.  ?HENT:  ?   Head: Normocephalic and atraumatic.  ?   Nose: Nose normal.  ?   Mouth/Throat:  ?   Mouth: Mucous membranes are moist.  ?Eyes:  ?   Extraocular Movements: Extraocular movements intact.  ?   Conjunctiva/sclera: Conjunctivae normal.  ?   Pupils: Pupils are equal, round, and reactive to light.  ?Cardiovascular:  ?   Rate and Rhythm: Normal rate and regular rhythm.  ?   Pulses: Normal pulses.  ?   Heart sounds: Normal heart sounds. No murmur heard. ?Pulmonary:  ?   Effort: Pulmonary effort is normal. No respiratory distress.  ?   Breath sounds: Normal breath sounds.  ?Abdominal:  ?   Palpations:  Abdomen is soft.  ?   Tenderness: There is no abdominal tenderness.  ?Musculoskeletal:     ?   General: No swelling.  ?   Cervical back: Normal range of motion and neck supple.  ?Skin: ?   General: Skin is warm and dry.  ?   Capillary Refill: Capillary refill takes less than 2 seconds.  ?Neurological:  ?   General: No focal deficit present.  ?   Mental Status: She is alert and oriented to person, place, and time.  ?   Cranial Nerves: No cranial nerve deficit.  ?   Sensory: No sensory deficit.  ?   Motor: No weakness.  ?   Coordination: Coordination normal.  ?   Comments: 5+ out of 5 strength throughout, normal sensation, no drift  ?Psychiatric:     ?   Mood and Affect: Mood normal.  ? ? ?ED Results / Procedures / Treatments   ?Labs ?(all labs ordered are listed, but only abnormal results are displayed) ?Labs Reviewed  ?CBC WITH DIFFERENTIAL/PLATELET - Abnormal; Notable for the following components:  ?    Result Value  ? Platelets 519 (*)   ? Monocytes Absolute 1.1 (*)   ? Abs Immature Granulocytes 0.09 (*)   ? All other components within normal limits  ?COMPREHENSIVE METABOLIC PANEL - Abnormal; Notable for the following components:  ? Alkaline Phosphatase 185 (*)   ? All other components within normal limits  ? ? ?EKG ?EKG Interpretation ? ?Date/Time:  Friday January 01 2022 09:57:47 EDT ?Ventricular Rate:  73 ?PR Interval:  165 ?QRS Duration: 83 ?QT Interval:  385 ?QTC Calculation: 425 ?R Axis:   73 ?Text Interpretation: Sinus rhythm Confirmed by Lennice Sites ([id number]) on 01/01/2022 10:10:06 AM ? ?Radiology ?MR BRAIN WO CONTRAST ? ?Result Date: 12/31/2021 ? [facility] [address] [place], [place] [address] [phone] NEUROIMAGING REPORT STUDY DATE: 12/30/2021 PATIENT NAME: AMILLIA SORIA DOB: [birth year] MRN: [id number] EXAM: MRI Brain without contrast ORDERING CLINICIAN: Marcial Pacas MD PhD CLINICAL HISTORY: 70 year old woman with vertigo and headaches COMPARISON FILMS: No MRI.  CT scan  10/27/2021. TECHNIQUE: MRI of the brain without contrast was obtained utilizing 5 mm axial slices with T1, T2, T2 flair, SWI and diffusion weighted views.  T1 sagittal and T2 coronal views were obtained. CONTRAST: none IMAGING SITE: [facility], [place] [place] FINDINGS: On sagittal images, the spinal cord is imaged caudally to C3-C4 and is normal in caliber.   The contents of the posterior fossa are of  normal size and position.   The pituitary gland and optic chiasm appear normal.    Brain volume appears normal.   The ventricles are normal in size and without distortion.  There are no abnormal extra-axial collections of fluid.  In the hemispheres, there are multiple T2/FLAIR hyperintense foci, mostly small and round, in the subcortical and deep white matter.  One focus is also noted in the left globus pallidus.  None of the foci appear to be acute on diffusion-weighted images.  The cerebellum and brainstem appears normal.  Diffusion weighted images are normal.  Susceptibility weighted images are normal.  There have been bilateral lens replacements.  Otherwise, the orbits appear normal.   The VIIth/VIIIth nerve complex appears normal.  The mastoid air cells appear normal.  The paranasal sinuses appear normal.  Flow voids are identified within the major intracerebral arteries.    ? ?This MRI of the brain without contrast shows the following: 1.  No acute findings. 2.  Multiple T2/FLAIR hyperintense foci in the subcortical and deep white matter.  This is nonspecific but most consistent with mild to moderate chronic microvascular ischemic change or sequela of migraine..  The pattern is not typical for demyelination.  None of the foci appear to be acute. INTERPRETING PHYSICIAN: Richard A. Felecia Shelling, MD, PhD, FAAN Certified in  [place] by [employer]   ? ?Procedures ?Procedures  ? ? ?Medications Ordered in ED ?Medications  ?sodium chloride 0.9 % bolus 1,000 mL (1,000 mLs  Intravenous New Bag/Given 01/01/22 1004)  ?prochlorperazine (COMPAZINE) injection 10 mg (10 mg Intravenous Given 01/01/22 1010)  ?diphenhydrAMINE (BENADRYL) capsule 25 mg (25 mg Oral Given 01/01/22 1011)  ?ketorolac (T

## 2022-01-01 NOTE — ED Triage Notes (Signed)
Pt reports fatigue and weakness for past several days, along with light-headedness- hx of vertigo. Denies SOB.  Dx'd with kidney infection last week and taking cipro.  Pt seen by PMD this morning and advised to come to ED for eval of low blood pressure (87/58). ?

## 2022-01-02 ENCOUNTER — Other Ambulatory Visit: Payer: Medicare Other

## 2022-01-04 ENCOUNTER — Other Ambulatory Visit: Payer: Medicare Other

## 2022-01-04 ENCOUNTER — Encounter: Payer: Self-pay | Admitting: Neurology

## 2022-01-12 DIAGNOSIS — R748 Abnormal levels of other serum enzymes: Secondary | ICD-10-CM | POA: Diagnosis not present

## 2022-01-12 DIAGNOSIS — N12 Tubulo-interstitial nephritis, not specified as acute or chronic: Secondary | ICD-10-CM | POA: Diagnosis not present

## 2022-01-12 DIAGNOSIS — R700 Elevated erythrocyte sedimentation rate: Secondary | ICD-10-CM | POA: Diagnosis not present

## 2022-01-12 DIAGNOSIS — D75839 Thrombocytosis, unspecified: Secondary | ICD-10-CM | POA: Diagnosis not present

## 2022-01-12 DIAGNOSIS — R7982 Elevated C-reactive protein (CRP): Secondary | ICD-10-CM | POA: Diagnosis not present

## 2022-01-12 DIAGNOSIS — E860 Dehydration: Secondary | ICD-10-CM | POA: Diagnosis not present

## 2022-01-21 NOTE — Progress Notes (Signed)
Patient: [name] Date of Birth: [birth year]  Reason for Visit: Follow up History from: Patient Primary Neurologist: Dr. Krista Blue   ASSESSMENT AND PLAN 71 y.o. year old female   1.  Acute onset of dizziness with moderate to severe prolonged headaches, light sensitivity, nausea -Felt most consistent with migraine variant -Re-try Topamax but only 50 mg at bedtime to see if more tolerable, other option is Effexor, but she is hesitant to try anti-depressants -Nurtec was too expensive, can use Fioricet for severe headaches -Most recent labs 01/12/22 from PCP showed normal CRP, ESR slightly elevated at 23 -MRI brain showed small vessel disease most consistent with migraine headaches -Send my chart updates, return back in 3 months   HISTORY  [name], is a 71 year old female seen in request by her primary care physician Dr. Harlan Stains, for evaluation of dizziness, headaches, initial evaluation was on December 23, 2021   I reviewed and summarized the referring note. PMHX. Hypothyroidism Depression,  Lumbar decompression surgery in 2013.   She had her first onset of dizziness on October 27, 2021, she stepped out of the car turning towards the left, then had sudden onset dizziness, felt her whole brain was in rotation, her neighbor was able to help drive her back home, she was able to walk into her house, symptoms persist and few hours later, she was evaluated at emergency room  Personally reviewed CT head without contrast, no acute intracranial abnormality noted   But her dizziness sensation, later light sensitivity headache persistent for few days, she was able to see by her ophthalmologist, there was no significant abnormality noted, her symptoms gradually improved after few days   She went to vestibular rehab in March 2023, overall has much improved  About a week ago, she began to experience similar sensation again, holoacranial headaches, unsteady sensation, extreme light  sensitivity, neck muscle tension, also retro-orbital area pain,  She tried naproxen with limited help, also have nausea, she denies difficulty talking, using her arms and legs   She denies a history of typical migraine in the past,  Update Jan 25, 2022 SS: Laboratory evaluation at office visit in December 23, 2021 showed significantly elevated CRP to 9, ESR 48, ALT 50, UA showed large leukocyte many bacteria, positive for E. Coli. Repeat labs 12/28/21 CRP 100, ESR 102.  MRI of the brain showed no acute abnormality, there was small vessel disease most consistent with history of migraine headaches.  In ER 01/01/22 for low BP from PCP, was 121/78 in ER, given IV fluids, IV Compazine, Benadryl, Toradol.  01/12/22, ESR slightly elevated 23, CRP normal. Normal CMP creatinine 0.61.  Feels daily headache, are mild, gets woken up by horrible migraine at 3-4 AM, most nights, like band around back of head, tried different pillow, tried sleeping on couch, persistent since 1st office visit, drinks water, is miserable, Fioricet helped in past. Snores at night, mouth is dry upon wakening.   Couldn't get Nurtec, was too expensive. Took Topamax 100 mg twice daily only 1 day, felt like she was on drugs. Feels each day gets better, initially couldn't walk had to use walking poles. Yesterday walked 2 miles without sticks. Went to church yesterday, the bright light triggered horrible migraine. Wearing dark sunglasses today. No vomiting in 2.5 weeks with headache.   Wants to be back to normal, balance isn't steady. Driving yesterday, saw black and white lines in the street, triggered spinning in her head.   REVIEW OF SYSTEMS:  Out of a complete 14 system review of symptoms, the patient complains only of the following symptoms, and all other reviewed systems are negative.  See HPI  ALLERGIES: Allergies  Allergen Reactions   Flagyl [Metronidazole] Anaphylaxis   Hydrocodone-Acetaminophen     Other reaction(s):  Confusion, Confusion (intolerance)   Tramadol Nausea Only and Nausea And Vomiting   Alendronate     Other reaction(s): Other (See Comments)   Amoxicillin     rash   Azithromycin     Other reaction(s): Other (See Comments)   Buspar [Buspirone]     Heart palpations   Buspirone Hcl     Other reaction(s): Irregular Heart Rate   Celebrex [Celecoxib]     Chest pain   Cymbalta [Duloxetine Hcl]     depression   Duloxetine     Other reaction(s): Other (See Comments)   Estradiol     Other reaction(s): Other (See Comments)   Gabapentin    Hydroxychloroquine     Other reaction(s): Other (See Comments)   Meloxicam Nausea Only   Tizanidine Other (See Comments)    spinning   Topiramate Other (See Comments)    "Spinning" when taken with tizanidine    HOME MEDICATIONS: Outpatient Medications Prior to Visit  Medication Sig Dispense Refill   ALPRAZolam (XANAX) 1 MG tablet Take 1-2 tablets thirty minutes prior to MRI.  May take one additional tablet before entering scanner, if needed.  MUST HAVE DRIVER. 3 tablet 0   butalbital-acetaminophen-caffeine (FIORICET) 50-325-40 MG tablet Take 1 tablet by mouth every 12 (twelve) hours as needed for headache. 12 tablet 0   ciprofloxacin (CIPRO) 500 MG tablet Take 1 tablet (500 mg total) by mouth 2 (two) times daily. 14 tablet 0   diphenhydrAMINE (BENADRYL) 25 MG tablet Take 25 mg by mouth every 6 (six) hours as needed.     halobetasol (ULTRAVATE) 0.05 % cream Apply topically 2 (two) times daily.     Levothyroxine Sodium 137 MCG CAPS   1   NAPROXEN PO Take by mouth.     UNABLE TO FIND Med Name: Pseudoephedrine HCL     promethazine (PHENERGAN) 12.5 MG tablet Take 1 tablet (12.5 mg total) by mouth every 8 (eight) hours as needed for nausea or vomiting. 10 tablet 0   Rimegepant Sulfate (NURTEC) 75 MG TBDP Take 1 tab at onset of migraine.  May repeat in 2 hrs, if needed.  Max dose: 2 tabs/day. This is a 30 day prescription. 12 tablet 5   topiramate (TOPAMAX)  100 MG tablet Take 1 tablet (100 mg total) by mouth 2 (two) times daily. 60 tablet 6   No facility-administered medications prior to visit.    PAST MEDICAL HISTORY: Past Medical History:  Diagnosis Date   Anxiety    Depression    Hypothyroidism    Thyroid disease     PAST SURGICAL HISTORY: Past Surgical History:  Procedure Laterality Date   BACK SURGERY     BUNIONECTOMY Right    CESAREAN SECTION      FAMILY HISTORY: Family History  Problem Relation Age of Onset   CAD Father    Stroke Brother     SOCIAL HISTORY: Social History   Socioeconomic History   Marital status: Divorced    Spouse name: Not on file   Number of children: Not on file   Years of education: Not on file   Highest education level: Not on file  Occupational History   Not on file  Tobacco Use  Smoking status: Never   Smokeless tobacco: Never  Substance and Sexual Activity   Alcohol use: Yes    Comment: socially   Drug use: No   Sexual activity: Not on file  Other Topics Concern   Not on file  Social History Narrative   Not on file   Social Determinants of Health   Financial Resource Strain: Not on file  Food Insecurity: Not on file  Transportation Needs: Not on file  Physical Activity: Not on file  Stress: Not on file  Social Connections: Not on file  Intimate Partner Violence: Not on file   PHYSICAL EXAM  Vitals:   01/25/22 1504  BP: (!) 144/86  Pulse: 71  Weight: 155 lb (70.3 kg)  Height: '5\' 8"'  (1.727 m)   Body mass index is 23.57 kg/m.  Generalized: Well developed, in no acute distress, wearing dark sunglasses Neurological examination  Mentation: Alert oriented to time, place, history taking. Follows all commands speech and language fluent Cranial nerve II-XII: Pupils were equal round reactive to light. Extraocular movements were full, visual field were full on confrontational test. Facial sensation and strength were normal. Head turning and shoulder shrug were normal and  symmetric. Motor: The motor testing reveals 5 over 5 strength of all 4 extremities. Good symmetric motor tone is noted throughout.  Sensory: Sensory testing is intact to soft touch on all 4 extremities. No evidence of extinction is noted.  Coordination: Cerebellar testing reveals good finger-nose-finger and heel-to-shin bilaterally.  Gait and station: Gait is normal, but cautious. Tandem gait is unsteady Reflexes: Deep tendon reflexes are symmetric and normal bilaterally.   DIAGNOSTIC DATA (LABS, IMAGING, TESTING) - I reviewed patient records, labs, notes, testing and imaging myself where available.  Lab Results  Component Value Date   WBC 7.2 01/01/2022   HGB 12.9 01/01/2022   HCT 38.5 01/01/2022   MCV 91.0 01/01/2022   PLT 519 (H) 01/01/2022      Component Value Date/Time   NA 140 01/01/2022 1005   NA 138 12/28/2021 1458   K 3.9 01/01/2022 1005   CL 105 01/01/2022 1005   CO2 24 01/01/2022 1005   GLUCOSE 80 01/01/2022 1005   BUN 12 01/01/2022 1005   BUN 12 12/28/2021 1458   CREATININE 0.61 01/01/2022 1005   CALCIUM 9.8 01/01/2022 1005   PROT 7.0 01/01/2022 1005   PROT 6.2 12/28/2021 1458   ALBUMIN 3.7 01/01/2022 1005   ALBUMIN 3.3 (L) 12/28/2021 1458   AST 25 01/01/2022 1005   ALT 37 01/01/2022 1005   ALKPHOS 185 (H) 01/01/2022 1005   BILITOT 0.5 01/01/2022 1005   BILITOT 0.4 12/28/2021 1458   GFRNONAA >60 01/01/2022 1005   GFRAA >90 08/15/2013 0400   No results found for: CHOL, HDL, LDLCALC, LDLDIRECT, TRIG, CHOLHDL No results found for: HGBA1C No results found for: [id number] Lab Results  Component Value Date   TSH 4.698 (H) 08/15/2013    Butler Denmark, AGNP-C, DNP 01/25/2022, 4:10 PM [facility] [address], [place], [place] [address] [phone]

## 2022-01-24 ENCOUNTER — Telehealth: Payer: Self-pay | Admitting: Neurology

## 2022-01-24 NOTE — Telephone Encounter (Signed)
Reviewed laboratory evaluation from [facility] dated Jan 12, 2022, normal CMP creatinine 0.61, ESR was slightly elevated 23, normal C-reactive protein of 8, liver functional test showed GGT of 148, elevated, we will have repeat liver functional test by Dr. Harlan Stains

## 2022-01-25 ENCOUNTER — Ambulatory Visit: Payer: Medicare Other | Admitting: Neurology

## 2022-01-25 ENCOUNTER — Encounter: Payer: Self-pay | Admitting: Neurology

## 2022-01-25 VITALS — BP 144/86 | HR 71 | Ht 68.0 in | Wt 155.0 lb

## 2022-01-25 DIAGNOSIS — R519 Headache, unspecified: Secondary | ICD-10-CM | POA: Diagnosis not present

## 2022-01-25 DIAGNOSIS — R42 Dizziness and giddiness: Secondary | ICD-10-CM | POA: Diagnosis not present

## 2022-01-25 MED ORDER — TOPIRAMATE 50 MG PO TABS: 50.0000 mg | ORAL_TABLET | Freq: Every day | ORAL | 5 refills | Status: DC

## 2022-01-25 NOTE — Patient Instructions (Addendum)
Start Topamax 50 mg at bedtime try this for a few nights, if you do well, we can increase it, just let me know Reach out to me via my chart See you back in 3 months with me

## 2022-01-26 ENCOUNTER — Encounter: Payer: Self-pay | Admitting: Neurology

## 2022-01-27 DIAGNOSIS — M5451 Vertebrogenic low back pain: Secondary | ICD-10-CM | POA: Diagnosis not present

## 2022-01-27 DIAGNOSIS — M25551 Pain in right hip: Secondary | ICD-10-CM | POA: Diagnosis not present

## 2022-01-28 NOTE — Progress Notes (Signed)
Chart reviewed, agree above plan ?

## 2022-03-01 DIAGNOSIS — L661 Lichen planopilaris: Secondary | ICD-10-CM | POA: Diagnosis not present

## 2022-03-01 DIAGNOSIS — D225 Melanocytic nevi of trunk: Secondary | ICD-10-CM | POA: Diagnosis not present

## 2022-03-01 DIAGNOSIS — L304 Erythema intertrigo: Secondary | ICD-10-CM | POA: Diagnosis not present

## 2022-03-01 DIAGNOSIS — L821 Other seborrheic keratosis: Secondary | ICD-10-CM | POA: Diagnosis not present

## 2022-03-14 ENCOUNTER — Other Ambulatory Visit: Payer: Self-pay

## 2022-03-14 ENCOUNTER — Observation Stay (HOSPITAL_COMMUNITY)
Admission: EM | Admit: 2022-03-14 | Discharge: 2022-03-15 | Disposition: A | Discharge status: Home / Self Care | Payer: Medicare Other | Attending: Interventional Cardiology | Admitting: Interventional Cardiology

## 2022-03-14 ENCOUNTER — Encounter (HOSPITAL_COMMUNITY): Payer: Self-pay | Admitting: Emergency Medicine

## 2022-03-14 DIAGNOSIS — R778 Other specified abnormalities of plasma proteins: Secondary | ICD-10-CM | POA: Diagnosis not present

## 2022-03-14 DIAGNOSIS — R0789 Other chest pain: Secondary | ICD-10-CM | POA: Diagnosis not present

## 2022-03-14 DIAGNOSIS — H81399 Other peripheral vertigo, unspecified ear: Secondary | ICD-10-CM

## 2022-03-14 DIAGNOSIS — I249 Acute ischemic heart disease, unspecified: Secondary | ICD-10-CM | POA: Diagnosis not present

## 2022-03-14 DIAGNOSIS — I2510 Atherosclerotic heart disease of native coronary artery without angina pectoris: Secondary | ICD-10-CM | POA: Diagnosis not present

## 2022-03-14 DIAGNOSIS — I214 Non-ST elevation (NSTEMI) myocardial infarction: Principal | ICD-10-CM | POA: Insufficient documentation

## 2022-03-14 DIAGNOSIS — R030 Elevated blood-pressure reading, without diagnosis of hypertension: Secondary | ICD-10-CM | POA: Diagnosis not present

## 2022-03-14 DIAGNOSIS — R42 Dizziness and giddiness: Secondary | ICD-10-CM | POA: Diagnosis not present

## 2022-03-14 DIAGNOSIS — I10 Essential (primary) hypertension: Secondary | ICD-10-CM | POA: Diagnosis not present

## 2022-03-14 DIAGNOSIS — E039 Hypothyroidism, unspecified: Secondary | ICD-10-CM | POA: Diagnosis not present

## 2022-03-14 DIAGNOSIS — J45909 Unspecified asthma, uncomplicated: Secondary | ICD-10-CM | POA: Insufficient documentation

## 2022-03-14 DIAGNOSIS — R079 Chest pain, unspecified: Secondary | ICD-10-CM | POA: Diagnosis not present

## 2022-03-14 DIAGNOSIS — R7309 Other abnormal glucose: Secondary | ICD-10-CM

## 2022-03-14 DIAGNOSIS — R519 Headache, unspecified: Secondary | ICD-10-CM | POA: Diagnosis not present

## 2022-03-14 DIAGNOSIS — I491 Atrial premature depolarization: Secondary | ICD-10-CM | POA: Diagnosis not present

## 2022-03-14 DIAGNOSIS — Z79899 Other long term (current) drug therapy: Secondary | ICD-10-CM | POA: Diagnosis not present

## 2022-03-14 LAB — CBC WITH DIFFERENTIAL/PLATELET
Abs Immature Granulocytes: 0.03 10*3/uL (ref 0.00–0.07)
Basophils Absolute: 0 10*3/uL (ref 0.0–0.1)
Basophils Relative: 0 %
Eosinophils Absolute: 0.1 10*3/uL (ref 0.0–0.5)
Eosinophils Relative: 1 %
HCT: 40.6 % (ref 36.0–46.0)
Hemoglobin: 13.2 g/dL (ref 12.0–15.0)
Immature Granulocytes: 0 %
Lymphocytes Relative: 13 %
Lymphs Abs: 1 10*3/uL (ref 0.7–4.0)
MCH: 30.6 pg (ref 26.0–34.0)
MCHC: 32.5 g/dL (ref 30.0–36.0)
MCV: 94.2 fL (ref 80.0–100.0)
Monocytes Absolute: 0.5 10*3/uL (ref 0.1–1.0)
Monocytes Relative: 8 %
Neutro Abs: 5.5 10*3/uL (ref 1.7–7.7)
Neutrophils Relative %: 78 %
Platelets: 262 10*3/uL (ref 150–400)
RBC: 4.31 MIL/uL (ref 3.87–5.11)
RDW: 12.7 % (ref 11.5–15.5)
WBC: 7.1 10*3/uL (ref 4.0–10.5)
nRBC: 0 % (ref 0.0–0.2)

## 2022-03-14 LAB — TROPONIN I (HIGH SENSITIVITY)
Troponin I (High Sensitivity): 43 ng/L — ABNORMAL HIGH (ref <18)
Troponin I (High Sensitivity): 91 ng/L — ABNORMAL HIGH (ref <18)

## 2022-03-14 LAB — COMPREHENSIVE METABOLIC PANEL
ALT: 18 U/L (ref 0–44)
AST: 23 U/L (ref 15–41)
Albumin: 3.6 g/dL (ref 3.5–5.0)
Alkaline Phosphatase: 76 U/L (ref 38–126)
Anion gap: 12 (ref 5–15)
BUN: 14 mg/dL (ref 8–23)
CO2: 21 mmol/L — ABNORMAL LOW (ref 22–32)
Calcium: 8.8 mg/dL — ABNORMAL LOW (ref 8.9–10.3)
Chloride: 106 mmol/L (ref 98–111)
Creatinine, Ser: 0.72 mg/dL (ref 0.44–1.00)
GFR, Estimated: >60 mL/min (ref >60)
Glucose, Bld: 127 mg/dL — ABNORMAL HIGH (ref 70–99)
Potassium: 3.8 mmol/L (ref 3.5–5.1)
Sodium: 139 mmol/L (ref 135–145)
Total Bilirubin: 0.5 mg/dL (ref 0.3–1.2)
Total Protein: 6.5 g/dL (ref 6.5–8.1)

## 2022-03-14 LAB — URINALYSIS, ROUTINE W REFLEX MICROSCOPIC
Bilirubin Urine: NEGATIVE
Glucose, UA: NEGATIVE mg/dL
Hgb urine dipstick: NEGATIVE
Ketones, ur: 20 mg/dL — AB
Leukocytes,Ua: NEGATIVE
Nitrite: NEGATIVE
Protein, ur: NEGATIVE mg/dL
Specific Gravity, Urine: 1.011 (ref 1.005–1.030)
pH: 6 (ref 5.0–8.0)

## 2022-03-14 LAB — LIPASE, BLOOD: Lipase: 30 U/L (ref 11–51)

## 2022-03-14 NOTE — ED Triage Notes (Signed)
Patient reports left upper chest pain onset this afternoon , mild SOB  with emesis and diaphoresis , she received Zofran 4 mg IV , NTG sl x2 and ASA 324 prior to arrival by EMS .

## 2022-03-14 NOTE — ED Provider Triage Note (Signed)
Emergency Medicine Provider Triage Evaluation Note  [name] , a 71 y.o. female  was evaluated in triage.  Pt complains of vertigo.  History of the same back in February.  Patient had sudden onset of room spinning dizziness, ataxia, nausea and vomiting.  She tried taking half a tablet of meclizine however vomited that up.  Patient given Zofran prior to arrival and her nausea is improved..  No headache, new or unilateral weakness, no difficulty with speech or swallow  Review of Systems  Positive: vertigo Negative: fever  Physical Exam  BP 130/78 (BP Location: Left Arm)   Pulse 73   Temp 97.6 F (36.4 C) (Oral)   Resp 16   SpO2 100%  Gen:   Awake, no distress   Resp:  Normal effort  MSK:   Moves extremities without difficulty  Other:  Nystagmus  Medical Decision Making  Medically screening exam initiated at 7:31 PM.  Appropriate orders placed.  [name] was informed that the remainder of the evaluation will be completed by another provider, this initial triage assessment does not replace that evaluation, and the importance of remaining in the ED until their evaluation is complete.  Work up initiated   [name], [place] 03/14/22 1938

## 2022-03-15 ENCOUNTER — Encounter (HOSPITAL_COMMUNITY): Payer: Self-pay | Admitting: Internal Medicine

## 2022-03-15 ENCOUNTER — Inpatient Hospital Stay (HOSPITAL_COMMUNITY): Payer: Medicare Other

## 2022-03-15 ENCOUNTER — Inpatient Hospital Stay (HOSPITAL_BASED_OUTPATIENT_CLINIC_OR_DEPARTMENT_OTHER): Payer: Medicare Other

## 2022-03-15 ENCOUNTER — Encounter (HOSPITAL_COMMUNITY)
Admission: EM | Disposition: A | Discharge status: Home / Self Care | Payer: Self-pay | Source: Home / Self Care | Attending: Internal Medicine

## 2022-03-15 DIAGNOSIS — R778 Other specified abnormalities of plasma proteins: Secondary | ICD-10-CM

## 2022-03-15 DIAGNOSIS — I214 Non-ST elevation (NSTEMI) myocardial infarction: Secondary | ICD-10-CM | POA: Diagnosis not present

## 2022-03-15 DIAGNOSIS — R079 Chest pain, unspecified: Secondary | ICD-10-CM | POA: Diagnosis not present

## 2022-03-15 DIAGNOSIS — I248 Other forms of acute ischemic heart disease: Secondary | ICD-10-CM | POA: Diagnosis not present

## 2022-03-15 HISTORY — PX: LEFT HEART CATH AND CORONARY ANGIOGRAPHY: CATH118249

## 2022-03-15 LAB — BASIC METABOLIC PANEL
Anion gap: 8 (ref 5–15)
BUN: 8 mg/dL (ref 8–23)
CO2: 24 mmol/L (ref 22–32)
Calcium: 8.8 mg/dL — ABNORMAL LOW (ref 8.9–10.3)
Chloride: 110 mmol/L (ref 98–111)
Creatinine, Ser: 0.69 mg/dL (ref 0.44–1.00)
GFR, Estimated: >60 mL/min (ref >60)
Glucose, Bld: 102 mg/dL — ABNORMAL HIGH (ref 70–99)
Potassium: 3.7 mmol/L (ref 3.5–5.1)
Sodium: 142 mmol/L (ref 135–145)

## 2022-03-15 LAB — T4, FREE: Free T4: 1.54 ng/dL — ABNORMAL HIGH (ref 0.61–1.12)

## 2022-03-15 LAB — PROTIME-INR
INR: 1.1 (ref 0.8–1.2)
Prothrombin Time: 14.5 seconds (ref 11.4–15.2)

## 2022-03-15 LAB — CBC
HCT: 37.2 % (ref 36.0–46.0)
Hemoglobin: 12.8 g/dL (ref 12.0–15.0)
MCH: 31.3 pg (ref 26.0–34.0)
MCHC: 34.4 g/dL (ref 30.0–36.0)
MCV: 91 fL (ref 80.0–100.0)
Platelets: 261 10*3/uL (ref 150–400)
RBC: 4.09 MIL/uL (ref 3.87–5.11)
RDW: 13 % (ref 11.5–15.5)
WBC: 6.8 10*3/uL (ref 4.0–10.5)
nRBC: 0 % (ref 0.0–0.2)

## 2022-03-15 LAB — TSH: TSH: 1.656 u[IU]/mL (ref 0.350–4.500)

## 2022-03-15 LAB — HIV ANTIBODY (ROUTINE TESTING W REFLEX): HIV Screen 4th Generation wRfx: NONREACTIVE

## 2022-03-15 LAB — HEPARIN LEVEL (UNFRACTIONATED): Heparin Unfractionated: 0.43 IU/mL (ref 0.30–0.70)

## 2022-03-15 LAB — LIPID PANEL
Cholesterol: 193 mg/dL (ref 0–200)
HDL: 66 mg/dL (ref >40)
LDL Cholesterol: 119 mg/dL — ABNORMAL HIGH (ref 0–99)
Total CHOL/HDL Ratio: 2.9 RATIO
Triglycerides: 39 mg/dL (ref <150)
VLDL: 8 mg/dL (ref 0–40)

## 2022-03-15 LAB — BRAIN NATRIURETIC PEPTIDE: B Natriuretic Peptide: 81 pg/mL (ref 0.0–100.0)

## 2022-03-15 LAB — ECHOCARDIOGRAM COMPLETE
Height: 68 in
S' Lateral: 2.4 cm
Weight: 2560 oz

## 2022-03-15 SURGERY — LEFT HEART CATH AND CORONARY ANGIOGRAPHY
Anesthesia: LOCAL

## 2022-03-15 MED ORDER — SODIUM CHLORIDE 0.9 % IV SOLN: INTRAVENOUS | Status: AC

## 2022-03-15 MED ORDER — ONDANSETRON HCL 4 MG/2ML IJ SOLN: 4.0000 mg | Freq: Four times a day (QID) | INTRAMUSCULAR | Status: DC | PRN

## 2022-03-15 MED ORDER — LIDOCAINE HCL (PF) 1 % IJ SOLN
INTRAMUSCULAR | Status: AC
  Filled 2022-03-15: qty 30

## 2022-03-15 MED ORDER — HEPARIN (PORCINE) 25000 UT/250ML-% IV SOLN
850.0000 [IU]/h | INTRAVENOUS | Status: DC
  Administered 2022-03-15: 850 [IU]/h via INTRAVENOUS
  Filled 2022-03-15: qty 250

## 2022-03-15 MED ORDER — HEPARIN (PORCINE) IN NACL 1000-0.9 UT/500ML-% IV SOLN
INTRAVENOUS | Status: DC | PRN
  Administered 2022-03-15 (×2): 500 mL

## 2022-03-15 MED ORDER — DIPHENHYDRAMINE HCL 50 MG/ML IJ SOLN
25.0000 mg | Freq: Once | INTRAMUSCULAR | Status: AC
Start: 2022-03-15 — End: 2022-03-15
  Administered 2022-03-15: 25 mg via INTRAVENOUS
  Filled 2022-03-15: qty 1

## 2022-03-15 MED ORDER — SODIUM CHLORIDE 0.9% FLUSH
3.0000 mL | INTRAVENOUS | Status: DC | PRN
Start: 2022-03-15 — End: 2022-03-15

## 2022-03-15 MED ORDER — MIDAZOLAM HCL 2 MG/2ML IJ SOLN
INTRAMUSCULAR | Status: AC
  Filled 2022-03-15: qty 2

## 2022-03-15 MED ORDER — LIDOCAINE HCL (PF) 1 % IJ SOLN
INTRAMUSCULAR | Status: DC | PRN
  Administered 2022-03-15: 2 mL

## 2022-03-15 MED ORDER — VERAPAMIL HCL 2.5 MG/ML IV SOLN
INTRAVENOUS | Status: AC
  Filled 2022-03-15: qty 2

## 2022-03-15 MED ORDER — ASPIRIN 81 MG PO CHEW: 324.0000 mg | CHEWABLE_TABLET | Freq: Once | ORAL | Status: DC

## 2022-03-15 MED ORDER — HEPARIN (PORCINE) IN NACL 1000-0.9 UT/500ML-% IV SOLN
INTRAVENOUS | Status: AC
  Filled 2022-03-15: qty 1000

## 2022-03-15 MED ORDER — FENTANYL CITRATE (PF) 100 MCG/2ML IJ SOLN
INTRAMUSCULAR | Status: AC
  Filled 2022-03-15: qty 2

## 2022-03-15 MED ORDER — HEPARIN SODIUM (PORCINE) 1000 UNIT/ML IJ SOLN
INTRAMUSCULAR | Status: AC
  Filled 2022-03-15: qty 10

## 2022-03-15 MED ORDER — ATORVASTATIN CALCIUM 20 MG PO TABS: 20.0000 mg | ORAL_TABLET | Freq: Every day | ORAL | 0 refills | Status: DC

## 2022-03-15 MED ORDER — FENTANYL CITRATE (PF) 100 MCG/2ML IJ SOLN
INTRAMUSCULAR | Status: DC | PRN
  Administered 2022-03-15: 25 ug via INTRAVENOUS

## 2022-03-15 MED ORDER — IOHEXOL 350 MG/ML SOLN
INTRAVENOUS | Status: DC | PRN
  Administered 2022-03-15: 40 mL

## 2022-03-15 MED ORDER — NITROGLYCERIN 0.4 MG SL SUBL: 0.4000 mg | SUBLINGUAL_TABLET | SUBLINGUAL | Status: DC | PRN

## 2022-03-15 MED ORDER — HYDRALAZINE HCL 20 MG/ML IJ SOLN: 10.0000 mg | INTRAMUSCULAR | Status: DC | PRN

## 2022-03-15 MED ORDER — LEVOTHYROXINE SODIUM 137 MCG PO TABS
137.0000 ug | ORAL_TABLET | Freq: Every day | ORAL | Status: DC
  Filled 2022-03-15: qty 1

## 2022-03-15 MED ORDER — LABETALOL HCL 5 MG/ML IV SOLN: 10.0000 mg | INTRAVENOUS | Status: DC | PRN

## 2022-03-15 MED ORDER — ASPIRIN 81 MG PO TBEC: 81.0000 mg | DELAYED_RELEASE_TABLET | Freq: Every day | ORAL | Status: DC

## 2022-03-15 MED ORDER — HEPARIN BOLUS VIA INFUSION
4000.0000 [IU] | Freq: Once | INTRAVENOUS | Status: AC
  Administered 2022-03-15: 4000 [IU] via INTRAVENOUS
  Filled 2022-03-15: qty 4000

## 2022-03-15 MED ORDER — LACTATED RINGERS IV BOLUS
1000.0000 mL | Freq: Once | INTRAVENOUS | Status: AC
  Administered 2022-03-15: 1000 mL via INTRAVENOUS

## 2022-03-15 MED ORDER — ASPIRIN 81 MG PO TBEC: 81.0000 mg | DELAYED_RELEASE_TABLET | Freq: Every day | ORAL | 2 refills | Status: DC

## 2022-03-15 MED ORDER — HEPARIN SODIUM (PORCINE) 1000 UNIT/ML IJ SOLN
INTRAMUSCULAR | Status: DC | PRN
  Administered 2022-03-15: 3500 [IU] via INTRAVENOUS

## 2022-03-15 MED ORDER — SODIUM CHLORIDE 0.9% FLUSH
3.0000 mL | Freq: Two times a day (BID) | INTRAVENOUS | Status: DC
Start: 2022-03-15 — End: 2022-03-15

## 2022-03-15 MED ORDER — VERAPAMIL HCL 2.5 MG/ML IV SOLN
INTRAVENOUS | Status: DC | PRN
  Administered 2022-03-15: 10 mL via INTRA_ARTERIAL

## 2022-03-15 MED ORDER — ATORVASTATIN CALCIUM 40 MG PO TABS
40.0000 mg | ORAL_TABLET | Freq: Every day | ORAL | Status: DC
  Filled 2022-03-15: qty 1

## 2022-03-15 MED ORDER — SODIUM CHLORIDE 0.9 % WEIGHT BASED INFUSION
1.0000 mL/kg/h | INTRAVENOUS | Status: DC
  Administered 2022-03-15: 1 mL/kg/h via INTRAVENOUS

## 2022-03-15 MED ORDER — SODIUM CHLORIDE 0.9 % WEIGHT BASED INFUSION
3.0000 mL/kg/h | INTRAVENOUS | Status: AC
  Administered 2022-03-15: 3 mL/kg/h via INTRAVENOUS

## 2022-03-15 MED ORDER — PROCHLORPERAZINE EDISYLATE 10 MG/2ML IJ SOLN
10.0000 mg | Freq: Once | INTRAMUSCULAR | Status: AC
  Administered 2022-03-15: 10 mg via INTRAVENOUS
  Filled 2022-03-15: qty 2

## 2022-03-15 MED ORDER — SODIUM CHLORIDE 0.9 % IV SOLN: 250.0000 mL | INTRAVENOUS | Status: DC | PRN

## 2022-03-15 MED ORDER — MIDAZOLAM HCL 2 MG/2ML IJ SOLN
INTRAMUSCULAR | Status: DC | PRN
  Administered 2022-03-15: 1 mg via INTRAVENOUS

## 2022-03-15 SURGICAL SUPPLY — 10 items
BAND CMPR LRG ZPHR (HEMOSTASIS) ×1
BAND ZEPHYR COMPRESS 30 LONG (HEMOSTASIS) ×1 IMPLANT
CATH 5FR JL3.5 JR4 ANG PIG MP (CATHETERS) ×1 IMPLANT
GLIDESHEATH SLEND SS 6F .021 (SHEATH) ×1 IMPLANT
GUIDEWIRE INQWIRE 1.5J.035X260 (WIRE) IMPLANT
INQWIRE 1.5J .035X260CM (WIRE) ×2
KIT HEART LEFT (KITS) ×2 IMPLANT
PACK CARDIAC CATHETERIZATION (CUSTOM PROCEDURE TRAY) ×2 IMPLANT
TRANSDUCER W/STOPCOCK (MISCELLANEOUS) ×2 IMPLANT
TUBING CIL FLEX 10 FLL-RA (TUBING) ×2 IMPLANT

## 2022-03-15 NOTE — ED Notes (Signed)
Cardiology at bedside.

## 2022-03-15 NOTE — Progress Notes (Addendum)
The patient has been seen in conjunction with Theodore Demark, PA-C. All aspects of care have been considered and discussed. The patient has been personally interviewed, examined, and all clinical data has been reviewed.  Chest pressure with vertigo. Delta Troponin I. Possible Stress CM vs CAD/ACS Needs cath.   Progress Note  Patient Name: [name] Date of Encounter: 03/15/2022  [facility] Cardiologist: Lesleigh Noe, MD new  Subjective   Has a headache, history of migraines  She got a total of 3 sublingual nitroglycerin, which helped her pain, it is a 0 right now.  The nausea and vomiting has resolved.  Her father died of a heart attack just before his 70th birthday, the same age as she is right now  She occasionally feels her heart flutter, no associated symptoms  Inpatient Medications    Scheduled Meds:  aspirin EC  81 mg Oral Daily   atorvastatin  40 mg Oral Daily   levothyroxine  137 mcg Oral Q0600   Continuous Infusions:  heparin 850 Units/hr (03/15/22 0237)   PRN Meds: nitroGLYCERIN, ondansetron (ZOFRAN) IV   Vital Signs    Vitals:   03/15/22 0615 03/15/22 0630 03/15/22 0645 03/15/22 0700  BP: 118/72 137/78 136/72 133/74  Pulse: 77 80 79 70  Resp: 13 12 12 12   Temp:      TempSrc:      SpO2: 97% 100% 100% 100%  Weight:      Height:        Intake/Output Summary (Last 24 hours) at 03/15/2022 0819 Last data filed at 03/15/2022 0630 Gross per 24 hour  Intake 1000 ml  Output --  Net 1000 ml      03/15/2022    1:29 AM 01/25/2022    3:04 PM 12/25/2021    6:02 PM  Last 3 Weights  Weight (lbs) 160 lb 155 lb 156 lb  Weight (kg) 72.576 kg 70.308 kg 70.761 kg      Telemetry    Sinus rhythm, PACs- Personally Reviewed  ECG    07/09 ECG is sinus rhythm, heart rate 78, PVCs, no acute ischemic changes, no pathologic Q waves- Personally Reviewed  Physical Exam   GEN: No acute distress.   Neck: No JVD Cardiac: RRR, no murmurs, rubs, or  gallops.  Respiratory: Clear to auscultation bilaterally. GI: Soft, nontender, non-distended  MS: No edema; No deformity. Neuro:  Nonfocal  Psych: Normal affect   Labs    High Sensitivity Troponin:   Recent Labs  Lab 03/14/22 1938 03/14/22 2204  TROPONINIHS 43* 91*     Chemistry Recent Labs  Lab 03/14/22 1935 03/15/22 0507  NA 139 142  K 3.8 3.7  CL 106 110  CO2 21* 24  GLUCOSE 127* 102*  BUN 14 8  CREATININE 0.72 0.69  CALCIUM 8.8* 8.8*  PROT 6.5  --   ALBUMIN 3.6  --   AST 23  --   ALT 18  --   ALKPHOS 76  --   BILITOT 0.5  --   GFRNONAA >60 >60  ANIONGAP 12 8    Lipids  Recent Labs  Lab 03/15/22 0507  CHOL 193  TRIG 39  HDL 66  LDLCALC 119*  CHOLHDL 2.9    Hematology Recent Labs  Lab 03/14/22 1935 03/15/22 0507  WBC 7.1 6.8  RBC 4.31 4.09  HGB 13.2 12.8  HCT 40.6 37.2  MCV 94.2 91.0  MCH 30.6 31.3  MCHC 32.5 34.4  RDW 12.7 13.0  PLT 262 261   Thyroid  Recent Labs  Lab 03/15/22 0507  TSH 1.656  FREET4 1.54*    BNP Recent Labs  Lab 03/15/22 0515  BNP 81.0    DDimer No results for input(s): "DDIMER" in the last 168 hours.   Radiology    DG Chest Port 1 View  Result Date: 03/15/2022 CLINICAL DATA:  Chest pain EXAM: PORTABLE CHEST 1 VIEW COMPARISON:  08/21/2017 FINDINGS: Cardiac shadow is within normal limits. Aortic calcifications are noted. The lungs are clear bilaterally. No bony abnormality is seen. IMPRESSION: No acute abnormality noted. Electronically Signed   By: Alcide Clever M.D.   On: 03/15/2022 01:58    Cardiac Studies   CARDIAC CATH: Ordered  ECHO: Ordered  Patient Profile     71 y.o. female with a history of hypothyroidism, anxiety and depression, migraines, was admitted 7/09 with nausea and vomiting, chest pain, elevated troponin.  Assessment & Plan    NSTEMI -She has never had this kind of chest pain before, currently pain-free -Was associated with nausea and vomiting, which has also resolved -She had  sublingual nitroglycerin x1, currently has a headache -She got Compazine 10 mg, lactated Ringer's IV and was started on IV heparin. -Currently she is resting comfortably - Cardiac catheterization was discussed with the patient and her son fully. The patient understands that risks include but are not limited to stroke (1 in 1000), death (1 in 1000), kidney failure [usually temporary] (1 in 500), bleeding (1 in 200), allergic reaction [possibly serious] (1 in 200).  The patient understands and is willing to proceed.    2.  Hypothyroid -She is compliant with her Synthroid dose of 137 micrograms, it has not changed in the last 5 years -TSH is within normal limits, her free T4 is elevated at 1.54 -Med changes per MD  3.  Dyslipidemia: -Her LDL is 119, her HDL is 66 -Prior to admission she was not on a statin, and is currently on Lipitor 40 mg daily  4.  Migraines: -She got a migraine after the nitroglycerin, is currently lying very still with something over her eyes to give out the light -She has Fioricet to take as needed, discuss with MD giving this   For questions or updates, please contact [facility] Please consult [email or web address] for contact info under        Signed, Theodore Demark, PA-C  03/15/2022, 8:19 AM

## 2022-03-15 NOTE — Progress Notes (Signed)
ANTICOAGULATION CONSULT NOTE  Pharmacy Consult for heparin Indication: chest pain/ACS  Vital Signs: BP: 146/69 (07/10 0915) Pulse Rate: 89 (07/10 0915)  Labs: Recent Labs    03/14/22 1935 03/14/22 1938 03/14/22 2204 03/15/22 0507 03/15/22 0757  HGB 13.2  --   --  12.8  --   HCT 40.6  --   --  37.2  --   PLT 262  --   --  261  --   LABPROT  --   --   --  14.5  --   INR  --   --   --  1.1  --   HEPARINUNFRC  --   --   --   --  0.43  CREATININE 0.72  --   --  0.69  --   TROPONINIHS  --  43* 91*  --   --      Estimated Creatinine Clearance: 67 mL/min (by C-G formula based on SCr of 0.69 mg/dL).  Assessment: 71 yoF presenting with chest pain. She was started on IV heparin for NSTEMI. Initial heparin level is therapeutic at 0.43. No bleeding noted. Planning cardiac cath.   Goal of Therapy:  Heparin level 0.3-0.7 units/ml Monitor platelets by anticoagulation protocol: Yes   Plan:  Continue heparin gtt 850 units/hr Daily heparin level and CBC F/u cath plans  Lysle Pearl, PharmD, BCPS Clinical Pharmacist Please see AMION for all pharmacy numbers 03/15/2022 11:46 AM

## 2022-03-15 NOTE — ED Notes (Signed)
Patient assisted to ambulate to and from restroom.  Steady gait noted

## 2022-03-15 NOTE — ED Notes (Signed)
ECHO at bedside.

## 2022-03-15 NOTE — Care Management Obs Status (Signed)
MEDICARE OBSERVATION STATUS NOTIFICATION   Patient Details  Name: [name] MRN: [id number] Date of Birth: 1951/03/03   Medicare Observation Status Notification Given:  Yes    Leone Haven, RN 03/15/2022, 4:58 PM

## 2022-03-15 NOTE — H&P (View-Only) (Signed)
The patient has been seen in conjunction with Theodore Demark, PA-C. All aspects of care have been considered and discussed. The patient has been personally interviewed, examined, and all clinical data has been reviewed.  Chest pressure with vertigo. Delta Troponin I. Possible Stress CM vs CAD/ACS Needs cath.   Progress Note  Patient Name: [name] Date of Encounter: 03/15/2022  [facility] Cardiologist: Lesleigh Noe, MD new  Subjective   Has a headache, history of migraines  She got a total of 3 sublingual nitroglycerin, which helped her pain, it is a 0 right now.  The nausea and vomiting has resolved.  Her father died of a heart attack just before his 70th birthday, the same age as she is right now  She occasionally feels her heart flutter, no associated symptoms  Inpatient Medications    Scheduled Meds:  aspirin EC  81 mg Oral Daily   atorvastatin  40 mg Oral Daily   levothyroxine  137 mcg Oral Q0600   Continuous Infusions:  heparin 850 Units/hr (03/15/22 0237)   PRN Meds: nitroGLYCERIN, ondansetron (ZOFRAN) IV   Vital Signs    Vitals:   03/15/22 0615 03/15/22 0630 03/15/22 0645 03/15/22 0700  BP: 118/72 137/78 136/72 133/74  Pulse: 77 80 79 70  Resp: 13 12 12 12   Temp:      TempSrc:      SpO2: 97% 100% 100% 100%  Weight:      Height:        Intake/Output Summary (Last 24 hours) at 03/15/2022 0819 Last data filed at 03/15/2022 0630 Gross per 24 hour  Intake 1000 ml  Output --  Net 1000 ml      03/15/2022    1:29 AM 01/25/2022    3:04 PM 12/25/2021    6:02 PM  Last 3 Weights  Weight (lbs) 160 lb 155 lb 156 lb  Weight (kg) 72.576 kg 70.308 kg 70.761 kg      Telemetry    Sinus rhythm, PACs- Personally Reviewed  ECG    07/09 ECG is sinus rhythm, heart rate 78, PVCs, no acute ischemic changes, no pathologic Q waves- Personally Reviewed  Physical Exam   GEN: No acute distress.   Neck: No JVD Cardiac: RRR, no murmurs, rubs, or  gallops.  Respiratory: Clear to auscultation bilaterally. GI: Soft, nontender, non-distended  MS: No edema; No deformity. Neuro:  Nonfocal  Psych: Normal affect   Labs    High Sensitivity Troponin:   Recent Labs  Lab 03/14/22 1938 03/14/22 2204  TROPONINIHS 43* 91*     Chemistry Recent Labs  Lab 03/14/22 1935 03/15/22 0507  NA 139 142  K 3.8 3.7  CL 106 110  CO2 21* 24  GLUCOSE 127* 102*  BUN 14 8  CREATININE 0.72 0.69  CALCIUM 8.8* 8.8*  PROT 6.5  --   ALBUMIN 3.6  --   AST 23  --   ALT 18  --   ALKPHOS 76  --   BILITOT 0.5  --   GFRNONAA >60 >60  ANIONGAP 12 8    Lipids  Recent Labs  Lab 03/15/22 0507  CHOL 193  TRIG 39  HDL 66  LDLCALC 119*  CHOLHDL 2.9    Hematology Recent Labs  Lab 03/14/22 1935 03/15/22 0507  WBC 7.1 6.8  RBC 4.31 4.09  HGB 13.2 12.8  HCT 40.6 37.2  MCV 94.2 91.0  MCH 30.6 31.3  MCHC 32.5 34.4  RDW 12.7 13.0  PLT 262 261   Thyroid  Recent Labs  Lab 03/15/22 0507  TSH 1.656  FREET4 1.54*    BNP Recent Labs  Lab 03/15/22 0515  BNP 81.0    DDimer No results for input(s): "DDIMER" in the last 168 hours.   Radiology    DG Chest Port 1 View  Result Date: 03/15/2022 CLINICAL DATA:  Chest pain EXAM: PORTABLE CHEST 1 VIEW COMPARISON:  08/21/2017 FINDINGS: Cardiac shadow is within normal limits. Aortic calcifications are noted. The lungs are clear bilaterally. No bony abnormality is seen. IMPRESSION: No acute abnormality noted. Electronically Signed   By: Mark  Lukens M.D.   On: 03/15/2022 01:58    Cardiac Studies   CARDIAC CATH: Ordered  ECHO: Ordered  Patient Profile     71 y.o. female with a history of hypothyroidism, anxiety and depression, migraines, was admitted 7/09 with nausea and vomiting, chest pain, elevated troponin.  Assessment & Plan    NSTEMI -She has never had this kind of chest pain before, currently pain-free -Was associated with nausea and vomiting, which has also resolved -She had  sublingual nitroglycerin x1, currently has a headache -She got Compazine 10 mg, lactated Ringer's IV and was started on IV heparin. -Currently she is resting comfortably - Cardiac catheterization was discussed with the patient and her son fully. The patient understands that risks include but are not limited to stroke (1 in 1000), death (1 in 1000), kidney failure [usually temporary] (1 in 500), bleeding (1 in 200), allergic reaction [possibly serious] (1 in 200).  The patient understands and is willing to proceed.    2.  Hypothyroid -She is compliant with her Synthroid dose of 137 micrograms, it has not changed in the last 5 years -TSH is within normal limits, her free T4 is elevated at 1.54 -Med changes per MD  3.  Dyslipidemia: -Her LDL is 119, her HDL is 66 -Prior to admission she was not on a statin, and is currently on Lipitor 40 mg daily  4.  Migraines: -She got a migraine after the nitroglycerin, is currently lying very still with something over her eyes to give out the light -She has Fioricet to take as needed, discuss with MD giving this   For questions or updates, please contact [facility] Please consult [email or web address] for contact info under        Signed, Rhonda Barrett, PA-C  03/15/2022, 8:19 AM    

## 2022-03-15 NOTE — H&P (Signed)
Cardiology Admission History and Physical:   Patient ID: IA LEEB MRN: [id number]; DOB: 03-Jan-1951   Admission date: 03/14/2022  PCP:  Laurann Montana, MD   [facility] Providers Cardiologist:  None        Chief Complaint:  chest pain and dizziness  Patient Profile:   [name] is a 71 y.o. female with hypothyroidism, anxiety, depression who is being seen 03/15/2022 for the evaluation of chest pain and dizziness.  History of Present Illness:   [name] is a 71 yo female with hypothyroidism, anxiety, depression who developed severe vertigo followed by left upper chest discomfort earlier yesterday afternoon with associated SOB, emesis, and diaphoresis.  She has had episodes of vertigo before, however this is the first time she has had subsequent chest pressure that was persistent. She felt very poorly from the pressure. Took meclizine with some relief of vertigo symptoms but ongoing chest pressure and SOB. This prompted ED visit.  Here, vital signs stable. ECG unremarkable. Trop 40s -> 90s. Started on heparin gtt and had asa 325 mg.   She does not smoke or use EtOH. Denies drug use. Fhx pertinent for father with CAD  Past Medical History:  Diagnosis Date   Anxiety    Depression    Hypothyroidism    Thyroid disease     Past Surgical History:  Procedure Laterality Date   BACK SURGERY     BUNIONECTOMY Right    CESAREAN SECTION       Medications Prior to Admission: Prior to Admission medications   Medication Sig Start Date End Date Taking? Authorizing Provider  ALPRAZolam Prudy Feeler) 1 MG tablet Take 1-2 tablets thirty minutes prior to MRI.  May take one additional tablet before entering scanner, if needed.  MUST HAVE DRIVER. 0/35/59   Levert Feinstein, MD  butalbital-acetaminophen-caffeine [facility]) [phone] MG tablet Take 1 tablet by mouth every 12 (twelve) hours as needed for headache. 12/31/21 12/31/22  Levert Feinstein, MD  ciprofloxacin (CIPRO) 500 MG tablet Take 1  tablet (500 mg total) by mouth 2 (two) times daily. 12/26/21   Zadie Rhine, MD  diphenhydrAMINE (BENADRYL) 25 MG tablet Take 25 mg by mouth every 6 (six) hours as needed.    [provider]  halobetasol (ULTRAVATE) 0.05 % cream Apply topically 2 (two) times daily.    [provider]  Levothyroxine Sodium 137 MCG CAPS  02/11/17   [provider]  NAPROXEN PO Take by mouth.    [provider]  topiramate (TOPAMAX) 50 MG tablet Take 1 tablet (50 mg total) by mouth at bedtime. 01/25/22   Glean Salvo, NP  UNABLE TO FIND Med Name: Pseudoephedrine HCL    [provider]     Allergies:    Allergies  Allergen Reactions   Flagyl [Metronidazole] Anaphylaxis   Hydrocodone-Acetaminophen     Other reaction(s): Confusion, Confusion (intolerance)   Tramadol Nausea Only and Nausea And Vomiting   Alendronate     Other reaction(s): Other (See Comments)   Amoxicillin     rash   Azithromycin     Other reaction(s): Other (See Comments)   Buspar [Buspirone]     Heart palpations   Buspirone Hcl     Other reaction(s): Irregular Heart Rate   Celebrex [Celecoxib]     Chest pain   Cymbalta [Duloxetine Hcl]     depression   Duloxetine     Other reaction(s): Other (See Comments)   Estradiol     Other reaction(s): Other (See  Comments)   Gabapentin    Hydroxychloroquine     Other reaction(s): Other (See Comments)   Meloxicam Nausea Only   Tizanidine Other (See Comments)    spinning   Topiramate Other (See Comments)    "Spinning" when taken with tizanidine    Social History:   Social History   Socioeconomic History   Marital status: Divorced    Spouse name: Not on file   Number of children: Not on file   Years of education: Not on file   Highest education level: Not on file  Occupational History   Not on file  Tobacco Use   Smoking status: Never   Smokeless tobacco: Never  Substance and Sexual Activity   Alcohol use: Yes    Comment: socially    Drug use: No   Sexual activity: Not on file  Other Topics Concern   Not on file  Social History Narrative   Not on file   Social Determinants of Health   Financial Resource Strain: Not on file  Food Insecurity: Not on file  Transportation Needs: Not on file  Physical Activity: Not on file  Stress: Not on file  Social Connections: Not on file  Intimate Partner Violence: Not on file    Family History:   The patient's family history includes CAD in her father; Stroke in her brother.    ROS:  Please see the history of present illness.  All other ROS reviewed and negative.     Physical Exam/Data:   Vitals:   03/14/22 2110 03/14/22 2334 03/15/22 0050 03/15/22 0100  BP: 133/82 128/90 (!) 158/89 (!) 164/68  Pulse: 75 80  86  Resp: 16 18 19 16   Temp: (!) 97.5 F (36.4 C)     TempSrc: Oral     SpO2: 100% 98% 100% 100%   No intake or output data in the 24 hours ending 03/15/22 0129    01/25/2022    3:04 PM 12/25/2021    6:02 PM 10/12/2018   10:05 AM  Last 3 Weights  Weight (lbs) 155 lb 156 lb 170 lb  Weight (kg) 70.308 kg 70.761 kg 77.111 kg     There is no height or weight on file to calculate BMI.  General:  Well nourished, well developed, in no acute distress HEENT: normal Neck: no JVD Vascular: No carotid bruits; Distal pulses 2+ bilaterally   Cardiac:  normal S1, S2; RRR; no murmur  Lungs:  clear to auscultation bilaterally, no wheezing, rhonchi or rales  Abd: soft, nontender, no hepatomegaly  Ext: no edema Musculoskeletal:  No deformities, BUE and BLE strength normal and equal Skin: warm and dry  Neuro:  CNs 2-12 intact, no focal abnormalities noted Psych:  Normal affect    EKG:  The ECG that was done  was personally reviewed and demonstrates sinus rhythm with pvcs  Relevant CV Studies: none  Laboratory Data:  High Sensitivity Troponin:   Recent Labs  Lab 03/14/22 1938 03/14/22 2204  TROPONINIHS 43* 91*      Chemistry Recent Labs  Lab  03/14/22 1935  NA 139  K 3.8  CL 106  CO2 21*  GLUCOSE 127*  BUN 14  CREATININE 0.72  CALCIUM 8.8*  GFRNONAA >60  ANIONGAP 12    Recent Labs  Lab 03/14/22 1935  PROT 6.5  ALBUMIN 3.6  AST 23  ALT 18  ALKPHOS 76  BILITOT 0.5   Lipids No results for input(s): "CHOL", "TRIG", "HDL", "LABVLDL", "LDLCALC", "CHOLHDL" in the  last 168 hours. Hematology Recent Labs  Lab 03/14/22 1935  WBC 7.1  RBC 4.31  HGB 13.2  HCT 40.6  MCV 94.2  MCH 30.6  MCHC 32.5  RDW 12.7  PLT 262   Thyroid No results for input(s): "TSH", "FREET4" in the last 168 hours. BNPNo results for input(s): "BNP", "PROBNP" in the last 168 hours.  DDimer No results for input(s): "DDIMER" in the last 168 hours.   Radiology/Studies:  No results found.   Assessment and Plan:   # NSTEMI She had typical angina outside of the vertigo. I do not think they are related and with positive troponin should evaluate and treat for MI.  - continue asa 81 and heparin gtt - ordered lipid panel and start atorvastatin 40 mg  - SL nitro PRN - echo ordered  - npo for possible lhc  - ordered lipid panel, hemoglobin a1c, thyroid panel - cardiac rehab   #vertigo -meclizine prn  #hypothyroidism - continue levothyroxine 137 mcg   Risk Assessment/Risk Scores:    TIMI Risk Score for Unstable Angina or Non-ST Elevation MI:   The patient's TIMI risk score is 2, which indicates a 8% risk of all cause mortality, new or recurrent myocardial infarction or need for urgent revascularization in the next 14 days.       Severity of Illness: The appropriate patient status for this patient is INPATIENT. Inpatient status is judged to be reasonable and necessary in order to provide the required intensity of service to ensure the patient's safety. The patient's presenting symptoms, physical exam findings, and initial radiographic and laboratory data in the context of their chronic comorbidities is felt to place them at high risk for  further clinical deterioration. Furthermore, it is not anticipated that the patient will be medically stable for discharge from the hospital within 2 midnights of admission.   * I certify that at the point of admission it is my clinical judgment that the patient will require inpatient hospital care spanning beyond 2 midnights from the point of admission due to high intensity of service, high risk for further deterioration and high frequency of surveillance required.*   For questions or updates, please contact [facility] Please consult [email or web address] for contact info under     Signed, Joellen Jersey, MD  03/15/2022 1:29 AM

## 2022-03-15 NOTE — Discharge Instructions (Signed)

## 2022-03-15 NOTE — Discharge Summary (Cosign Needed)
Discharge Summary    Patient ID: [name] MRN: [id number]; DOB: [birth year]  Admit date: 03/14/2022 Discharge date: 03/15/2022  PCP:  Laurann Montana, MD   [facility] Providers Cardiologist:  Lesleigh Noe, MD     Discharge Diagnoses    Principal Problem:   Elevated troponin Active Problems:   Hypothyroidism   Vertigo  Diagnostic Studies/Procedures    Cath: 03/15/22    Prox RCA lesion is 20% stenosed.   Mild non-obstructive CAD Normal LVEDP   Recommendations: Medical management of mild CAD.   Echo: 03/15/22  IMPRESSIONS     1. Left ventricular ejection fraction, by estimation, is 60 to 65%. The  left ventricle has normal function. The left ventricle has no regional  wall motion abnormalities. There is mild left ventricular hypertrophy.  Left ventricular diastolic parameters  were normal.   2. Right ventricular systolic function is normal. The right ventricular  size is normal.   3. Left atrial size was mildly dilated.   4. The mitral valve is normal in structure. Trivial mitral valve  regurgitation.   5. The aortic valve was not well visualized. Aortic valve regurgitation  is trivial. No aortic stenosis is present.   6. The inferior vena cava is dilated in size with <50% respiratory  variability, suggesting right atrial pressure of 15 mmHg.   FINDINGS   Left Ventricle: Left ventricular ejection fraction, by estimation, is 60  to 65%. The left ventricle has normal function. The left ventricle has no  regional wall motion abnormalities. The left ventricular internal cavity  size was normal in size. There is   mild left ventricular hypertrophy. Left ventricular diastolic parameters  were normal.   Right Ventricle: The right ventricular size is normal. No increase in  right ventricular wall thickness. Right ventricular systolic function is  normal. The tricuspid regurgitant velocity is 1.62 m/s, and with an  assumed right atrial pressure of 15 mmHg,   the estimated right ventricular systolic pressure is 25.5 mmHg.   Left Atrium: Left atrial size was mildly dilated.   Right Atrium: Right atrial size was normal in size.   Pericardium: Trivial pericardial effusion is present.   Mitral Valve: The mitral valve is normal in structure. Trivial mitral  valve regurgitation.   Tricuspid Valve: The tricuspid valve is normal in structure. Tricuspid  valve regurgitation is trivial.   Aortic Valve: The aortic valve was not well visualized. Aortic valve  regurgitation is trivial. No aortic stenosis is present.   Pulmonic Valve: The pulmonic valve was not well visualized. Pulmonic valve  regurgitation is not visualized.   Aorta: The aortic root is normal in size and structure.   Venous: The inferior vena cava is dilated in size with less than 50%  respiratory variability, suggesting right atrial pressure of 15 mmHg.   IAS/Shunts: The interatrial septum was not well visualized.  _____________   History of Present Illness     [name] is a 71 y.o. female with hypothyroidism, anxiety, depression who presented with vertigo followed by severe left upper chest discomfort with associated shortness of breath, emesis and diaphoresis.  She has had episodes of vertigo before, however this was the first time she has had subsequent chest pressure that was persistent. She felt very poorly from the pressure. Took meclizine with some relief of vertigo symptoms but ongoing chest pressure and SOB. This prompted ED visit.    ECG unremarkable. Trop 40s -> 90s. Started on heparin gtt and had asa  325 mg.    She does not smoke or use EtOH. Denies drug use. Fhx pertinent for father with CAD.   Hospital Course     Elevated Troponin: hsTn 43>>91, underwent cardiac catheterization noted above with 20% proximal RCA stenosis.  Recommendations for medical management.  -- Continue aspirin, atorvastatin 20 mg daily  Dyslipidemia: LDL 119, HDL 66 --Continue  atorvastatin 20 mg daily --LFTs/FLP in 8 weeks  Hypothyroidism: TSH WNL, free T4 1.5 --Continue Synthroid daily --Follow-up with PCP  Migraines: Continue home meds  Patient was seen by Dr. Katrinka Blazing and deemed stable for discharge home. Follow up with PCP after discharge. Medication adjustments sent to pharmacy of choice.   Did the patient have an acute coronary syndrome (MI, NSTEMI, STEMI, etc) this admission?:  No                               Did the patient have a percutaneous coronary intervention (stent / angioplasty)?:  No.   _____________  Discharge Vitals Blood pressure 119/73, pulse 91, temperature (!) 97.5 F (36.4 C), temperature source Oral, resp. rate 12, height 5\' 8"  (1.727 m), weight 72.6 kg, SpO2 97 %.  Filed Weights   03/15/22 0129  Weight: 72.6 kg    Labs & Radiologic Studies    CBC Recent Labs    03/14/22 1935 03/15/22 0507  WBC 7.1 6.8  NEUTROABS 5.5  --   HGB 13.2 12.8  HCT 40.6 37.2  MCV 94.2 91.0  PLT 262 261   Basic Metabolic Panel Recent Labs    05/16/22 1935 03/15/22 0507  NA 139 142  K 3.8 3.7  CL 106 110  CO2 21* 24  GLUCOSE 127* 102*  BUN 14 8  CREATININE 0.72 0.69  CALCIUM 8.8* 8.8*   Liver Function Tests Recent Labs    03/14/22 1935  AST 23  ALT 18  ALKPHOS 76  BILITOT 0.5  PROT 6.5  ALBUMIN 3.6   Recent Labs    03/14/22 1935  LIPASE 30   High Sensitivity Troponin:   Recent Labs  Lab 03/14/22 1938 03/14/22 2204  TROPONINIHS 43* 91*    BNP Invalid input(s): "POCBNP" D-Dimer No results for input(s): "DDIMER" in the last 72 hours. Hemoglobin A1C No results for input(s): "HGBA1C" in the last 72 hours. Fasting Lipid Panel Recent Labs    03/15/22 0507  CHOL 193  HDL 66  LDLCALC 119*  TRIG 39  CHOLHDL 2.9   Thyroid Function Tests Recent Labs    03/15/22 0507  TSH 1.656   _____________  CARDIAC CATHETERIZATION  Result Date: 03/15/2022   Prox RCA lesion is 20% stenosed. Mild non-obstructive  CAD Normal LVEDP Recommendations: Medical management of mild CAD.   ECHOCARDIOGRAM COMPLETE  Result Date: 03/15/2022    ECHOCARDIOGRAM REPORT   Patient Name:   KAMIA INSALACO Date of Exam: 03/15/2022 Medical Rec #:  05/16/2022       Height:       68.0 in Accession #:    [id number]      Weight:       160.0 lb Date of Birth:  01/13/1951       BSA:          1.859 m Patient Age:    71 years        BP:           146/69 mmHg Patient Gender: F  HR:           75 bpm. Exam Location:  Inpatient Procedure: 2D Echo, Cardiac Doppler and Color Doppler Indications:    Acute ischemic heart disease  History:        Patient has no prior history of Echocardiogram examinations.                 Risk Factors:Hypertension. Ectopy noted during exam.  Sonographer:    Melton Krebs RDCS, FE, PE Referring Phys: [id number] DENNIS NARCISSE JR IMPRESSIONS  1. Left ventricular ejection fraction, by estimation, is 60 to 65%. The left ventricle has normal function. The left ventricle has no regional wall motion abnormalities. There is mild left ventricular hypertrophy. Left ventricular diastolic parameters were normal.  2. Right ventricular systolic function is normal. The right ventricular size is normal.  3. Left atrial size was mildly dilated.  4. The mitral valve is normal in structure. Trivial mitral valve regurgitation.  5. The aortic valve was not well visualized. Aortic valve regurgitation is trivial. No aortic stenosis is present.  6. The inferior vena cava is dilated in size with <50% respiratory variability, suggesting right atrial pressure of 15 mmHg. FINDINGS  Left Ventricle: Left ventricular ejection fraction, by estimation, is 60 to 65%. The left ventricle has normal function. The left ventricle has no regional wall motion abnormalities. The left ventricular internal cavity size was normal in size. There is  mild left ventricular hypertrophy. Left ventricular diastolic parameters were normal. Right Ventricle: The right  ventricular size is normal. No increase in right ventricular wall thickness. Right ventricular systolic function is normal. The tricuspid regurgitant velocity is 1.62 m/s, and with an assumed right atrial pressure of 15 mmHg, the estimated right ventricular systolic pressure is 25.5 mmHg. Left Atrium: Left atrial size was mildly dilated. Right Atrium: Right atrial size was normal in size. Pericardium: Trivial pericardial effusion is present. Mitral Valve: The mitral valve is normal in structure. Trivial mitral valve regurgitation. Tricuspid Valve: The tricuspid valve is normal in structure. Tricuspid valve regurgitation is trivial. Aortic Valve: The aortic valve was not well visualized. Aortic valve regurgitation is trivial. No aortic stenosis is present. Pulmonic Valve: The pulmonic valve was not well visualized. Pulmonic valve regurgitation is not visualized. Aorta: The aortic root is normal in size and structure. Venous: The inferior vena cava is dilated in size with less than 50% respiratory variability, suggesting right atrial pressure of 15 mmHg. IAS/Shunts: The interatrial septum was not well visualized.  LEFT VENTRICLE PLAX 2D LVIDd:         3.90 cm   Diastology LVIDs:         2.40 cm   LV e' medial:  11.00 cm/s LV PW:         0.90 cm   LV e' lateral: 13.60 cm/s LV IVS:        1.00 cm LVOT diam:     2.20 cm LV SV:         76 LV SV Index:   41 LVOT Area:     3.80 cm  RIGHT VENTRICLE RV S prime:     16.60 cm/s TAPSE (M-mode): 2.0 cm LEFT ATRIUM             Index        RIGHT ATRIUM           Index LA diam:        3.40 cm 1.83 cm/m   RA Area:  14.20 cm LA Vol (A2C):   66.8 ml 35.94 ml/m  RA Volume:   33.50 ml  18.02 ml/m LA Vol (A4C):   60.5 ml 32.55 ml/m LA Biplane Vol: 65.5 ml 35.24 ml/m  AORTIC VALVE LVOT Vmax:   95.70 cm/s LVOT Vmean:  62.150 cm/s LVOT VTI:    0.200 m  AORTA Ao Root diam: 3.10 cm TRICUSPID VALVE TR Peak grad:   10.5 mmHg TR Vmax:        162.00 cm/s  SHUNTS Systemic VTI:  0.20 m  Systemic Diam: 2.20 cm Epifanio Lescheshristopher Schumann MD Electronically signed by Epifanio Lescheshristopher Schumann MD Signature Date/Time: 03/15/2022/11:56:56 AM    Final    DG Chest Port 1 View  Result Date: 03/15/2022 CLINICAL DATA:  Chest pain EXAM: PORTABLE CHEST 1 VIEW COMPARISON:  08/21/2017 FINDINGS: Cardiac shadow is within normal limits. Aortic calcifications are noted. The lungs are clear bilaterally. No bony abnormality is seen. IMPRESSION: No acute abnormality noted. Electronically Signed   By: Alcide CleverMark  Lukens M.D.   On: 03/15/2022 01:58    Disposition   Pt is being discharged home today in good condition.  Follow-up Plans & Appointments     Follow-up Information     Laurann MontanaWhite, Cynthia, MD Follow up.   Specialty: Family Medicine Why: Please arrange follow up within the next 2 weeks Contact information: [address] [place] [address] [phone]                  Discharge Medications   Allergies as of 03/15/2022       Reactions   Flagyl [metronidazole] Anaphylaxis   Hydrocodone-acetaminophen    Other reaction(s): Confusion, Confusion (intolerance)   Tramadol Nausea Only, Nausea And Vomiting   Alendronate    Other reaction(s): Other (See Comments)   Amoxicillin    rash   Azithromycin    Other reaction(s): Other (See Comments)   Buspar [buspirone]    Heart palpations   Buspirone Hcl    Other reaction(s): Irregular Heart Rate   Celebrex [celecoxib]    Chest pain   Cymbalta [duloxetine Hcl]    depression   Duloxetine    Other reaction(s): Other (See Comments)   Estradiol    Other reaction(s): Other (See Comments)   Gabapentin    Hydroxychloroquine    Other reaction(s): Other (See Comments)   Meloxicam Nausea Only   Tizanidine Other (See Comments)   spinning   Topiramate Other (See Comments)   "Spinning" when taken with tizanidine        Medication List     STOP taking these medications    ciprofloxacin 500 MG tablet Commonly known as:  CIPRO       TAKE these medications    ALPRAZolam 1 MG tablet Commonly known as: XANAX Take 1-2 tablets thirty minutes prior to MRI.  May take one additional tablet before entering scanner, if needed.  MUST HAVE DRIVER.   aspirin EC 81 MG tablet Take 1 tablet (81 mg total) by mouth daily. Swallow whole. Start taking on: March 16, 2022   atorvastatin 20 MG tablet Commonly known as: LIPITOR Take 1 tablet (20 mg total) by mouth daily. Start taking on: March 16, 2022   butalbital-acetaminophen-caffeine 50-325-40 MG tablet Commonly known as: FIORICET Take 1 tablet by mouth every 12 (twelve) hours as needed for headache.   diphenhydrAMINE 25 MG tablet Commonly known as: BENADRYL Take 25 mg by mouth every 6 (six) hours as needed.   halobetasol 0.05 % cream  Commonly known as: ULTRAVATE Apply topically 2 (two) times daily.   Levothyroxine Sodium 137 MCG Caps   NAPROXEN PO Take by mouth.   topiramate 50 MG tablet Commonly known as: TOPAMAX Take 1 tablet (50 mg total) by mouth at bedtime.   UNABLE TO FIND Med Name: Pseudoephedrine HCL           Outstanding Labs/Studies   FLP/LFTs in 8 weeks   Duration of Discharge Encounter   Greater than 30 minutes including physician time.  Signed, Laverda Page, NP 03/15/2022, 4:20 PM

## 2022-03-15 NOTE — Progress Notes (Signed)
ANTICOAGULATION CONSULT NOTE - Initial Consult  Pharmacy Consult for heparin Indication: chest pain/ACS  Allergies  Allergen Reactions   Flagyl [Metronidazole] Anaphylaxis   Hydrocodone-Acetaminophen     Other reaction(s): Confusion, Confusion (intolerance)   Tramadol Nausea Only and Nausea And Vomiting   Alendronate     Other reaction(s): Other (See Comments)   Amoxicillin     rash   Azithromycin     Other reaction(s): Other (See Comments)   Buspar [Buspirone]     Heart palpations   Buspirone Hcl     Other reaction(s): Irregular Heart Rate   Celebrex [Celecoxib]     Chest pain   Cymbalta [Duloxetine Hcl]     depression   Duloxetine     Other reaction(s): Other (See Comments)   Estradiol     Other reaction(s): Other (See Comments)   Gabapentin    Hydroxychloroquine     Other reaction(s): Other (See Comments)   Meloxicam Nausea Only   Tizanidine Other (See Comments)    spinning   Topiramate Other (See Comments)    "Spinning" when taken with tizanidine    Vital Signs: Temp: 97.5 F (36.4 C) (07/09 2110) Temp Source: Oral (07/09 2110) BP: 128/90 (07/09 2334) Pulse Rate: 80 (07/09 2334)  Labs: Recent Labs    03/14/22 1935 03/14/22 1938 03/14/22 2204  HGB 13.2  --   --   HCT 40.6  --   --   PLT 262  --   --   CREATININE 0.72  --   --   TROPONINIHS  --  43* 91*    CrCl cannot be calculated (Unknown ideal weight.).   Medical History: Past Medical History:  Diagnosis Date   Anxiety    Depression    Hypothyroidism    Thyroid disease      Assessment: 65 yoF presenting with chest pain. No anticoagulation reported prior to admission. CBC WNL. Pharmacy to dose heparin  Goal of Therapy:  Heparin level 0.3-0.7 units/ml Monitor platelets by anticoagulation protocol: Yes   Plan:  Give 4000 units bolus x 1 Start heparin infusion at 850 units/hr Check anti-Xa level in 6 hours and daily while on heparin Continue to monitor H&H and platelets  Ruben Im, PharmD Clinical Pharmacist 03/15/2022 1:04 AM Please check AMION for all [facility] Pharmacy numbers

## 2022-03-15 NOTE — Care Management CC44 (Signed)
Condition Code 44 Documentation Completed  Patient Details  Name: TSERING LEAMAN MRN: [id number] Date of Birth: [birth year]   Condition Code 44 given:  Yes Patient signature on Condition Code 44 notice:  Yes Documentation of 2 MD's agreement:  Yes Code 44 added to claim:  Yes    Leone Haven, RN 03/15/2022, 4:58 PM

## 2022-03-15 NOTE — Interval H&P Note (Signed)
History and Physical Interval Note:  03/15/2022 1:47 PM  [name]  has presented today for surgery, with the diagnosis of unstable angina.  The various methods of treatment have been discussed with the patient and family. After consideration of risks, benefits and other options for treatment, the patient has consented to  Procedure(s): LEFT HEART CATH AND CORONARY ANGIOGRAPHY (N/A) as a surgical intervention.  The patient's history has been reviewed, patient examined, no change in status, stable for surgery.  I have reviewed the patient's chart and labs.  Questions were answered to the patient's satisfaction.    Cath Lab Visit (complete for each Cath Lab visit)  Clinical Evaluation Leading to the Procedure:   ACS: Yes.    Non-ACS:    Anginal Classification: CCS III  Anti-ischemic medical therapy: No Therapy  Non-Invasive Test Results: No non-invasive testing performed  Prior CABG: No previous CABG        Verne Carrow

## 2022-03-15 NOTE — ED Provider Notes (Signed)
[facility] EMERGENCY DEPARTMENT Provider Note   CSN: [id number] Arrival date & time: 03/14/22  1851     History  Chief Complaint  Patient presents with   Chest Pain    [name] is a 71 y.o. female.  The history is provided by the patient.  Chest Pain She has history of hypothyroidism, asthma and came in by ambulance because of an episode of severe vertigo.  At about 5 PM, she noted severe spinning sensation with nausea, vomiting and she broke out into a cold sweat.  She then developed a heavy feeling in her chest which is still present to a slight degree.  She has been treated for vertigo in the past and did try taking a dose of meclizine which was not initially effective.  She denies any dyspnea.  She came in by ambulance where she was given nitroglycerin, but she does not remember if it improved her chest heaviness.  She was also given ondansetron which has improved her nausea.  She has never had heavy feeling in her chest before, and she has not had diaphoresis as part of vertigo episodes in the past.  Vertigo has resolved, but she still has some mild nausea.  Of note, she states that she had a severe episode of tinnitus last night.  She is also complaining of a headache with photophobia.  She is a non-smoker and denies history of hypertension, diabetes, hyperlipidemia.  There is no family history of premature coronary atherosclerosis, but her father did die of a heart attack at age 16.   Home Medications Prior to Admission medications   Medication Sig Start Date End Date Taking? Authorizing Provider  ALPRAZolam Prudy Feeler) 1 MG tablet Take 1-2 tablets thirty minutes prior to MRI.  May take one additional tablet before entering scanner, if needed.  MUST HAVE DRIVER. 11/19/15   Levert Feinstein, MD  butalbital-acetaminophen-caffeine [facility]) [phone] MG tablet Take 1 tablet by mouth every 12 (twelve) hours as needed for headache. 12/31/21 12/31/22  Levert Feinstein, MD   ciprofloxacin (CIPRO) 500 MG tablet Take 1 tablet (500 mg total) by mouth 2 (two) times daily. 12/26/21   Zadie Rhine, MD  diphenhydrAMINE (BENADRYL) 25 MG tablet Take 25 mg by mouth every 6 (six) hours as needed.    [provider]  halobetasol (ULTRAVATE) 0.05 % cream Apply topically 2 (two) times daily.    [provider]  Levothyroxine Sodium 137 MCG CAPS  02/11/17   [provider]  NAPROXEN PO Take by mouth.    [provider]  topiramate (TOPAMAX) 50 MG tablet Take 1 tablet (50 mg total) by mouth at bedtime. 01/25/22   Glean Salvo, NP  UNABLE TO FIND Med Name: Pseudoephedrine HCL    [provider]      Allergies    Flagyl [metronidazole], Hydrocodone-acetaminophen, Tramadol, Alendronate, Amoxicillin, Azithromycin, Buspar [buspirone], Buspirone hcl, Celebrex [celecoxib], Cymbalta [duloxetine hcl], Duloxetine, Estradiol, Gabapentin, Hydroxychloroquine, Meloxicam, Tizanidine, and Topiramate    Review of Systems   Review of Systems  Cardiovascular:  Positive for chest pain.  All other systems reviewed and are negative.   Physical Exam Updated Vital Signs BP 128/90 (BP Location: Left Arm)   Pulse 80   Temp (!) 97.5 F (36.4 C) (Oral)   Resp 18   SpO2 98%  Physical Exam Vitals and nursing note reviewed.   71 year old female, resting comfortably and in no acute distress. Vital signs are significant for elevated blood pressure. Oxygen  saturation is 100%, which is normal. Head is normocephalic and atraumatic. PERRLA, EOMI. Oropharynx is clear.  There is no nystagmus. Neck is nontender and supple without adenopathy or JVD. Back is nontender and there is no CVA tenderness. Lungs are clear without rales, wheezes, or rhonchi. Chest is nontender. Heart has regular rate and rhythm without murmur. Abdomen is soft, flat, nontender. Extremities have no cyanosis or edema, full range of motion is present. Skin is warm and dry without  rash. Neurologic: Mental status is normal, cranial nerves are intact, moves all extremities equally.  ED Results / Procedures / Treatments   Labs (all labs ordered are listed, but only abnormal results are displayed) Labs Reviewed  COMPREHENSIVE METABOLIC PANEL - Abnormal; Notable for the following components:      Result Value   CO2 21 (*)    Glucose, Bld 127 (*)    Calcium 8.8 (*)    All other components within normal limits  URINALYSIS, ROUTINE W REFLEX MICROSCOPIC - Abnormal; Notable for the following components:   Ketones, ur 20 (*)    All other components within normal limits  TROPONIN I (HIGH SENSITIVITY) - Abnormal; Notable for the following components:   Troponin I (High Sensitivity) 43 (*)    All other components within normal limits  TROPONIN I (HIGH SENSITIVITY) - Abnormal; Notable for the following components:   Troponin I (High Sensitivity) 91 (*)    All other components within normal limits  CBC WITH DIFFERENTIAL/PLATELET  LIPASE, BLOOD    EKG EKG Interpretation  Date/Time:  Sunday March 14 2022 19:47:04 EDT Ventricular Rate:  78 PR Interval:  190 QRS Duration: 68 QT Interval:  414 QTC Calculation: 471 R Axis:   76 Text Interpretation: Sinus rhythm with occasional Premature ventricular complexes Otherwise normal ECG When compared with ECG of 01-Jan-2022 09:57, Premature ventricular complexes are now present Confirmed by Delora Fuel ([id number]) on 03/15/2022 12:43:22 AM  Radiology No results found.  Procedures Procedures  Cardiac monitor shows normal sinus rhythm with occasional PVCs, per my interpretation.  Medications Ordered in ED Medications  aspirin chewable tablet 324 mg (has no administration in time range)  lactated ringers bolus 1,000 mL (has no administration in time range)  prochlorperazine (COMPAZINE) injection 10 mg (has no administration in time range)  diphenhydrAMINE (BENADRYL) injection 25 mg (has no administration in time range)  heparin  bolus via infusion 4,000 Units (has no administration in time range)  heparin ADULT infusion 100 units/mL (25000 units/244mL) (has no administration in time range)  nitroGLYCERIN (NITROSTAT) SL tablet 0.4 mg (has no administration in time range)    ED Course/ Medical Decision Making/ A&P                           Medical Decision Making  Vertigo which seems most likely to be peripheral vertigo, mainly resolved although still some residual nausea.  Chest discomfort with diaphoresis concerning for possible ACS.  I independently reviewed and interpreted her ECG which shows occasional PVCs and otherwise normal.  No abnormal ST or T wave changes seen.  I have reviewed and interpreted all of her laboratory tests, and my interpretation is elevated and rising troponin concerning for acute coronary syndrome.  CBC is normal and metabolic panel significant for mild elevation of glucose which is likely stress related.  Mild ketonuria is present consistent with episodes of vomiting.  Headache which sounds like it may be a migraine variant and could also be  related to her vertigo.  I have ordered a headache cocktail of lactated Ringer's solution, prochlorperazine, diphenhydramine.  For her ACS, I have ordered aspirin and intravenous heparin as well as additional nitroglycerin.  I have ordered a chest x-ray, results pending.  I have reviewed her old records, and I do not see any cardiovascular evaluation in the past.  However, I have reviewed a CT of her abdomen and pelvis on 12/26/2021 and I believe I see some coronary artery calcifications which were not mentioned in the report.  I have discussed the case with Dr. Hyacinth Meeker of cardiology service who agrees to admit the patient.  CRITICAL CARE Performed by: Dione Booze Total critical care time: 45 minutes Critical care time was exclusive of separately billable procedures and treating other patients. Critical care was necessary to treat or prevent imminent or  life-threatening deterioration. Critical care was time spent personally by me on the following activities: development of treatment plan with patient and/or surrogate as well as nursing, discussions with consultants, evaluation of patient's response to treatment, examination of patient, obtaining history from patient or surrogate, ordering and performing treatments and interventions, ordering and review of laboratory studies, ordering and review of radiographic studies, pulse oximetry and re-evaluation of patient's condition.  Final Clinical Impression(s) / ED Diagnoses Final diagnoses:  ACS (acute coronary syndrome) (HCC)  Peripheral vertigo, unspecified laterality  Bad headache  Elevated blood pressure reading without diagnosis of hypertension  Elevated random blood glucose level    Rx / DC Orders ED Discharge Orders     None         Dione Booze, MD 03/15/22 0128

## 2022-03-15 NOTE — ED Notes (Signed)
Pt transported to cath lab.  

## 2022-03-16 ENCOUNTER — Encounter (HOSPITAL_COMMUNITY): Payer: Self-pay | Admitting: Cardiovascular Disease

## 2022-03-16 LAB — LIPOPROTEIN A (LPA): Lipoprotein (a): 176.2 nmol/L — ABNORMAL HIGH (ref <75.0)

## 2022-03-19 DIAGNOSIS — R7982 Elevated C-reactive protein (CRP): Secondary | ICD-10-CM | POA: Diagnosis not present

## 2022-03-19 DIAGNOSIS — G43909 Migraine, unspecified, not intractable, without status migrainosus: Secondary | ICD-10-CM | POA: Diagnosis not present

## 2022-03-19 DIAGNOSIS — E785 Hyperlipidemia, unspecified: Secondary | ICD-10-CM | POA: Diagnosis not present

## 2022-03-19 DIAGNOSIS — H8110 Benign paroxysmal vertigo, unspecified ear: Secondary | ICD-10-CM | POA: Diagnosis not present

## 2022-03-19 DIAGNOSIS — R700 Elevated erythrocyte sedimentation rate: Secondary | ICD-10-CM | POA: Diagnosis not present

## 2022-03-19 DIAGNOSIS — E039 Hypothyroidism, unspecified: Secondary | ICD-10-CM | POA: Diagnosis not present

## 2022-03-23 ENCOUNTER — Encounter: Payer: Self-pay | Admitting: Neurology

## 2022-03-23 ENCOUNTER — Ambulatory Visit: Payer: Medicare Other | Admitting: Neurology

## 2022-03-23 VITALS — BP 111/75 | HR 68 | Ht 68.0 in | Wt 156.0 lb

## 2022-03-23 DIAGNOSIS — R42 Dizziness and giddiness: Secondary | ICD-10-CM | POA: Diagnosis not present

## 2022-03-23 DIAGNOSIS — R519 Headache, unspecified: Secondary | ICD-10-CM | POA: Diagnosis not present

## 2022-03-23 MED ORDER — EMGALITY 120 MG/ML ~~LOC~~ SOAJ
120.0000 mg | SUBCUTANEOUS | 11 refills | Status: DC
Start: 2022-03-23 — End: 2022-07-20

## 2022-03-23 MED ORDER — UBRELVY 100 MG PO TABS
100.0000 mg | ORAL_TABLET | ORAL | 11 refills | Status: DC | PRN
Start: 2022-03-23 — End: 2023-04-12

## 2022-03-23 MED ORDER — EMGALITY 120 MG/ML ~~LOC~~ SOAJ
240.0000 mg | SUBCUTANEOUS | 0 refills | Status: DC
Start: 2022-03-23 — End: 2022-04-14

## 2022-03-23 NOTE — Patient Instructions (Signed)
Start taking Emgality for headache prevention, you will start with loading dose of 2 injections, then 1 injection monthly, at onset of headache, try Ubrelvy for acute headache   Meds ordered this encounter  Medications   Galcanezumab-gnlm (EMGALITY) 120 MG/ML SOAJ    Sig: Inject 240 mg into the skin every 30 (thirty) days.    Dispense:  2 mL    Refill:  0    Please fill the loading dose 1st, then fill maintenance dose 2nd   Galcanezumab-gnlm (EMGALITY) 120 MG/ML SOAJ    Sig: Inject 120 mg into the skin every 30 (thirty) days.    Dispense:  1.12 mL    Refill:  11    Fill this 2nd   Ubrogepant (UBRELVY) 100 MG TABS    Sig: Take 100 mg by mouth as needed (take 1 at onset of headache, max is 200 mg in 24 hours).    Dispense:  10 tablet    Refill:  11

## 2022-03-23 NOTE — Progress Notes (Addendum)
Patient: [name] Date of Birth: 1951/01/11  Reason for Visit: Follow up History from: Patient Primary Neurologist: Dr. Krista Blue   ASSESSMENT AND PLAN 71 y.o. year old female   1.  Acute onset of dizziness with moderate to severe prolonged headaches, light sensitivity, nausea -Felt consistent with migraine variant -Start Emgality for migraine preventative -Try Roselyn Meier as needed at onset of headache syndrome -Order sleep consult, history of snoring, severe headaches wake her up at 3 AM; reviewed with Dr. Rexene Alberts -Previously tried and failed: Topamax, did not want to continue Fioricet due to concern that it was a barbiturate, unwilling to try Effexor due to side effect from antidepressants, Nurtec was too expensive, nerve block -Reportedly labs from PCP 03/19/22 ESR, CRP were normal, 01/12/22 labs from PCP showed normal CRP, ESR slightly elevated at 23 -MRI brain showed small vessel disease most consistent with migraine headaches -Has follow-up in late August, will keep this  HISTORY  [name], is a 71 year old female seen in request by her primary care physician Dr. Harlan Stains, for evaluation of dizziness, headaches, initial evaluation was on December 23, 2021   I reviewed and summarized the referring note. PMHX. Hypothyroidism Depression,  Lumbar decompression surgery in 2013.   She had her first onset of dizziness on October 27, 2021, she stepped out of the car turning towards the left, then had sudden onset dizziness, felt her whole brain was in rotation, her neighbor was able to help drive her back home, she was able to walk into her house, symptoms persist and few hours later, she was evaluated at emergency room  Personally reviewed CT head without contrast, no acute intracranial abnormality noted   But her dizziness sensation, later light sensitivity headache persistent for few days, she was able to see by her ophthalmologist, there was no significant abnormality  noted, her symptoms gradually improved after few days   She went to vestibular rehab in March 2023, overall has much improved  About a week ago, she began to experience similar sensation again, holoacranial headaches, unsteady sensation, extreme light sensitivity, neck muscle tension, also retro-orbital area pain,  She tried naproxen with limited help, also have nausea, she denies difficulty talking, using her arms and legs   She denies a history of typical migraine in the past,  Update Jan 25, 2022 SS: Laboratory evaluation at office visit in December 23, 2021 showed significantly elevated CRP to 9, ESR 48, ALT 50, UA showed large leukocyte many bacteria, positive for E. Coli. Repeat labs 12/28/21 CRP 100, ESR 102.  MRI of the brain showed no acute abnormality, there was small vessel disease most consistent with history of migraine headaches.  In ER 01/01/22 for low BP from PCP, was 121/78 in ER, given IV fluids, IV Compazine, Benadryl, Toradol.  01/12/22, ESR slightly elevated 23, CRP normal. Normal CMP creatinine 0.61.  Feels daily headache, are mild, gets woken up by horrible migraine at 3-4 AM, most nights, like band around back of head, tried different pillow, tried sleeping on couch, persistent since 1st office visit, drinks water, is miserable, Fioricet helped in past. Snores at night, mouth is dry upon wakening.   Couldn't get Nurtec, was too expensive. Took Topamax 100 mg twice daily only 1 day, felt like she was on drugs. Feels each day gets better, initially couldn't walk had to use walking poles. Yesterday walked 2 miles without sticks. Went to church yesterday, the bright light triggered horrible migraine. Wearing dark sunglasses today. No  vomiting in 2.5 weeks with headache.   Wants to be back to normal, balance isn't steady. Driving yesterday, saw black and white lines in the street, triggered spinning in her head.   Update March 23, 2022 SS: Admitted 03/14/22 for severe left upper  chest pain, SOB, emesis, diaphoresis, and elevated troponin.  Started on heparin and aspirin.  Underwent cardiac cath, 20% proximal right CCA stenosis, continue on aspirin, she refused statin.   Claims high pitched sound in her ears constant (has seen ENT), has headache most of the time feels like clamp, pain to right jaw for over 1 year, had molar removed, told vertical root fracture. Fluorescent light triggers, if she closes her eyes, can see bright light then recedes, can be staircase. Wears sunglasses. Lives alone, keeps her apartment dark, screens are triggers. All of a sudden brain starts "swirling", room spins counter clock wise. On 03/14/22 this happened, began violent vomiting, took her meclizine. Then developed CP (see above, for positive troponin). This terrible spell happened on heavy rain weather change, power was off, then was looking at laptop when symptoms started. PCP saw 03/19/22, patient reports ESR, CRP, TSH were now normal.   After last office visit with me 01/25/22 could not tolerate even Topamax 50 mg at bedtime, explained flashing lights in the brain, could not sleep.  Is not willing to try Effexor.  REVIEW OF SYSTEMS: Out of a complete 14 system review of symptoms, the patient complains only of the following symptoms, and all other reviewed systems are negative.  See HPI  ALLERGIES: Allergies  Allergen Reactions   Flagyl [Metronidazole] Anaphylaxis   Hydrocodone-Acetaminophen     Other reaction(s): Confusion, Confusion (intolerance)   Tramadol Nausea Only and Nausea And Vomiting   Alendronate     Other reaction(s): Other (See Comments)   Amoxicillin     rash   Azithromycin     Other reaction(s): Other (See Comments)   Buspar [Buspirone]     Heart palpations   Buspirone Hcl     Other reaction(s): Irregular Heart Rate   Celebrex [Celecoxib]     Chest pain   Cymbalta [Duloxetine Hcl]     depression   Duloxetine     Other reaction(s): Other (See Comments)   Estradiol      Other reaction(s): Other (See Comments)   Gabapentin    Hydroxychloroquine     Other reaction(s): Other (See Comments)   Meloxicam Nausea Only   Tizanidine Other (See Comments)    spinning   Topiramate Other (See Comments)    "Spinning" when taken with tizanidine    HOME MEDICATIONS: Outpatient Medications Prior to Visit  Medication Sig Dispense Refill   aspirin EC 81 MG tablet Take 1 tablet (81 mg total) by mouth daily. Swallow whole. 30 tablet 2   butalbital-acetaminophen-caffeine (FIORICET) 50-325-40 MG tablet Take 1 tablet by mouth every 12 (twelve) hours as needed for headache. 12 tablet 0   diphenhydrAMINE (BENADRYL) 25 MG tablet Take 25 mg by mouth every 6 (six) hours as needed.     halobetasol (ULTRAVATE) 0.05 % cream Apply topically 2 (two) times daily.     Levothyroxine Sodium 137 MCG CAPS   1   NAPROXEN PO Take by mouth.     ondansetron (ZOFRAN) 8 MG tablet 1 tablets     ALPRAZolam (XANAX) 1 MG tablet Take 1-2 tablets thirty minutes prior to MRI.  May take one additional tablet before entering scanner, if needed.  MUST HAVE DRIVER. 3 tablet 0  atorvastatin (LIPITOR) 20 MG tablet Take 1 tablet (20 mg total) by mouth daily. 90 tablet 0   topiramate (TOPAMAX) 50 MG tablet Take 1 tablet (50 mg total) by mouth at bedtime. 30 tablet 5   UNABLE TO FIND Med Name: Pseudoephedrine HCL     No facility-administered medications prior to visit.    PAST MEDICAL HISTORY: Past Medical History:  Diagnosis Date   Anxiety    Depression    Hypothyroidism    Thyroid disease     PAST SURGICAL HISTORY: Past Surgical History:  Procedure Laterality Date   BACK SURGERY     BUNIONECTOMY Right    CESAREAN SECTION     LEFT HEART CATH AND CORONARY ANGIOGRAPHY N/A 03/15/2022   Procedure: LEFT HEART CATH AND CORONARY ANGIOGRAPHY;  Surgeon: Burnell Blanks, MD;  Location: [facility];  Service: Cardiovascular;  Laterality: N/A;    FAMILY HISTORY: Family History  Problem  Relation Age of Onset   Heart attack Father    CAD Father    Stroke Brother     SOCIAL HISTORY: Social History   Socioeconomic History   Marital status: Divorced    Spouse name: Not on file   Number of children: Not on file   Years of education: Not on file   Highest education level: Not on file  Occupational History   Not on file  Tobacco Use   Smoking status: Never   Smokeless tobacco: Never  Substance and Sexual Activity   Alcohol use: Yes    Comment: socially   Drug use: No   Sexual activity: Not on file  Other Topics Concern   Not on file  Social History Narrative   Not on file   Social Determinants of Health   Financial Resource Strain: Not on file  Food Insecurity: Not on file  Transportation Needs: Not on file  Physical Activity: Not on file  Stress: Not on file  Social Connections: Not on file  Intimate Partner Violence: Not on file   PHYSICAL EXAM  Vitals:   03/23/22 1403  BP: 111/75  Pulse: 68  Weight: 156 lb (70.8 kg)  Height: '5\' 8"'  (1.727 m)    Body mass index is 23.72 kg/m.  Generalized: Well developed, in no acute distress, wearing dark sunglasses, moves with caution Neurological examination  Mentation: Alert oriented to time, place, history taking. Follows all commands speech and language fluent Cranial nerve II-XII: Pupils were equal round reactive to light. Extraocular movements were full, visual field were full on confrontational test. Facial sensation and strength were normal. Head turning and shoulder shrug were normal and symmetric. Motor: The motor testing reveals 5 over 5 strength of all 4 extremities. Good symmetric motor tone is noted throughout.  Sensory: Sensory testing is intact to soft touch on all 4 extremities. No evidence of extinction is noted.  Coordination: Cerebellar testing reveals good finger-nose-finger and heel-to-shin bilaterally.  Gait and station: Gait is normal, but cautious. Tandem gait is unsteady Reflexes:  Deep tendon reflexes are symmetric and normal bilaterally.   DIAGNOSTIC DATA (LABS, IMAGING, TESTING) - I reviewed patient records, labs, notes, testing and imaging myself where available.  Lab Results  Component Value Date   WBC 6.8 03/15/2022   HGB 12.8 03/15/2022   HCT 37.2 03/15/2022   MCV 91.0 03/15/2022   PLT 261 03/15/2022      Component Value Date/Time   NA 142 03/15/2022 0507   NA 138 12/28/2021 1458   K 3.7 03/15/2022  0507   CL 110 03/15/2022 0507   CO2 24 03/15/2022 0507   GLUCOSE 102 (H) 03/15/2022 0507   BUN 8 03/15/2022 0507   BUN 12 12/28/2021 1458   CREATININE 0.69 03/15/2022 0507   CALCIUM 8.8 (L) 03/15/2022 0507   PROT 6.5 03/14/2022 1935   PROT 6.2 12/28/2021 1458   ALBUMIN 3.6 03/14/2022 1935   ALBUMIN 3.3 (L) 12/28/2021 1458   AST 23 03/14/2022 1935   ALT 18 03/14/2022 1935   ALKPHOS 76 03/14/2022 1935   BILITOT 0.5 03/14/2022 1935   BILITOT 0.4 12/28/2021 1458   GFRNONAA >60 03/15/2022 0507   GFRAA >90 08/15/2013 0400   Lab Results  Component Value Date   CHOL 193 03/15/2022   HDL 66 03/15/2022   LDLCALC 119 (H) 03/15/2022   TRIG 39 03/15/2022   CHOLHDL 2.9 03/15/2022   No results found for: "HGBA1C" No results found for: "VITAMINB12" Lab Results  Component Value Date   TSH 1.656 03/15/2022    Butler Denmark, AGNP-C, DNP 03/23/2022, 2:26 PM [facility] [address], [place], [place] [address] [phone]  Addendum: Chart reviewed, persistent symptoms, nighttime headaches, pending sleep consult September 8, the other options are nortriptyline as migraine prevention, might  help her sleep as well,

## 2022-03-24 ENCOUNTER — Telehealth: Payer: Self-pay

## 2022-03-24 ENCOUNTER — Telehealth: Payer: Self-pay | Admitting: Neurology

## 2022-03-24 NOTE — Telephone Encounter (Signed)
Sylvi from [facility] is requesting a call back from the nurse

## 2022-03-24 NOTE — Telephone Encounter (Signed)
I returned the call to [facility]. The question was concerning the quantity. The rx was for #10 per month. Her plan allows #16 per month. There will not be an issue. The medication has been approved (separate note on the PA).

## 2022-03-24 NOTE — Telephone Encounter (Signed)
Steward Drone @ [facility] has called re: approval from 03-24-22 to 03-25-23 if there are questions  call back [phone] option 5

## 2022-03-24 NOTE — Telephone Encounter (Signed)
PA sent by [facility]  Key: [id number]) Rx #: [id number] Emgality 120MG /ML  PENDING

## 2022-03-24 NOTE — Telephone Encounter (Signed)
Approve   Message from Plan Effective from 03/24/2022 through 03/25/2023.

## 2022-03-24 NOTE — Telephone Encounter (Signed)
Bernita Raisin 100MG  tablets PA sent to [facility] Key: [id number] - Rx #: [facility] Need help? Call [id number] at [phone] Status pending     [phone] [place] Medicare Part D Electronic Request Form (CB)

## 2022-03-25 ENCOUNTER — Ambulatory Visit: Payer: Medicare Other

## 2022-04-07 ENCOUNTER — Encounter: Payer: Self-pay | Admitting: Neurology

## 2022-04-07 ENCOUNTER — Ambulatory Visit (INDEPENDENT_AMBULATORY_CARE_PROVIDER_SITE_OTHER): Payer: Medicare Other | Admitting: Neurology

## 2022-04-07 VITALS — BP 110/73 | HR 76 | Ht 68.0 in | Wt 156.5 lb

## 2022-04-07 DIAGNOSIS — G43109 Migraine with aura, not intractable, without status migrainosus: Secondary | ICD-10-CM

## 2022-04-07 DIAGNOSIS — R519 Headache, unspecified: Secondary | ICD-10-CM

## 2022-04-07 DIAGNOSIS — G4701 Insomnia due to medical condition: Secondary | ICD-10-CM | POA: Diagnosis not present

## 2022-04-07 DIAGNOSIS — R0683 Snoring: Secondary | ICD-10-CM | POA: Diagnosis not present

## 2022-04-07 DIAGNOSIS — I25119 Atherosclerotic heart disease of native coronary artery with unspecified angina pectoris: Secondary | ICD-10-CM

## 2022-04-07 DIAGNOSIS — G43E09 Chronic migraine with aura, not intractable, without status migrainosus: Secondary | ICD-10-CM

## 2022-04-07 DIAGNOSIS — G4763 Sleep related bruxism: Secondary | ICD-10-CM

## 2022-04-07 DIAGNOSIS — R351 Nocturia: Secondary | ICD-10-CM

## 2022-04-07 NOTE — Patient Instructions (Addendum)
Please speak to Dr. Cliffton Asters about Elavil, 10 mg at night.      Quality Sleep Information, Adult Quality sleep is important for your mental and physical health. It also improves your quality of life. Quality sleep means you: Are asleep for most of the time you are in bed. Fall asleep within 30 minutes. Wake up no more than once a night.  Are awake for no longer than 20 minutes if you do wake up during the night. Most adults need 7-8 hours of quality sleep each night. How can poor sleep affect me? If you do not get enough quality sleep, you may have: Mood swings. Daytime sleepiness. Confusion. Decreased reaction time. Sleep disorders, such as insomnia and sleep apnea. Difficulty with: Solving problems. Coping with stress. Paying attention. These issues may affect your performance and productivity at work, school, and at home. Lack of sleep may also put you at higher risk for accidents, suicide, and risky behaviors. If you do not get quality sleep you may also be at higher risk for several health problems, including: Infections. Type 2 diabetes. Heart disease. High blood pressure. Obesity. Worsening of long-term conditions, like arthritis, kidney disease, depression, Parkinson's disease, and epilepsy. What actions can I take to get more quality sleep?     Stick to a sleep schedule. Go to sleep and wake up at about the same time each day. Do not try to sleep less on weekdays and make up for lost sleep on weekends. This does not work. Try to get about 30 minutes of exercise on most days. Do not exercise 2-3 hours before going to bed. Limit naps during the day to 30 minutes or less. Do not use any products that contain nicotine or tobacco, such as cigarettes or e-cigarettes. If you need help quitting, ask your health care provider. Do not drink caffeinated beverages for at least 8 hours before going to bed. Coffee, tea, and some sodas contain caffeine. Do not drink alcohol close to  bedtime. Do not eat large meals close to bedtime. Do not take naps in the late afternoon. Try to get at least 30 minutes of sunlight every day. Morning sunlight is best. Make time to relax before bed. Reading, listening to music, or taking a hot bath promotes quality sleep. Make your bedroom a place that promotes quality sleep. Keep your bedroom dark, quiet, and at a comfortable room temperature. Make sure your bed is comfortable. Take out sleep distractions like TV, a computer, smartphone, and bright lights. If you are lying awake in bed for longer than 20 minutes, get up and do a relaxing activity until you feel sleepy. Work with your health care provider to treat medical conditions that may affect sleeping, such as: Nasal obstruction. Snoring. Sleep apnea and other sleep disorders. Talk to your health care provider if you think any of your prescription medicines may cause you to have difficulty falling or staying asleep. If you have sleep problems, talk with a sleep consultant. If you think you have a sleep disorder, talk with your health care provider about getting evaluated by a specialist. Where to find more information National Sleep Foundation website: https://sleepfoundation.org National Heart, Lung, and Blood Institute (NHLBI): https://hall.info/.pdf Centers for Disease Control and Prevention (CDC): DetailSports.is Contact a health care provider if you: Have trouble getting to sleep or staying asleep. Often wake up very early in the morning and cannot get back to sleep. Have daytime sleepiness. Have daytime sleep attacks of suddenly falling asleep and sudden muscle  weakness (narcolepsy). Have a tingling sensation in your legs with a strong urge to move your legs (restless legs syndrome). Stop breathing briefly during sleep (sleep apnea). Think you have a sleep disorder or are taking a medicine that is affecting your quality of  sleep. Summary Most adults need 7-8 hours of quality sleep each night. Getting enough quality sleep is an important part of health and well-being. Make your bedroom a place that promotes quality sleep and avoid things that may cause you to have poor sleep, such as alcohol, caffeine, smoking, and large meals. Talk to your health care provider if you have trouble falling asleep or staying asleep. This information is not intended to replace advice given to you by your health care provider. Make sure you discuss any questions you have with your health care provider. Document Revised: 09/28/2021 Document Reviewed: 06/22/2021 Elsevier Patient Education  2023 Elsevier Inc. Screening for Sleep Apnea  Sleep apnea is a condition in which breathing pauses or becomes shallow during sleep. Sleep apnea screening is a test to determine if you are at risk for sleep apnea. The test includes a series of questions. It will only takes a few minutes. Your health care provider may ask you to have this test in preparation for surgery or as part of a physical exam. What are the symptoms of sleep apnea? Common symptoms of sleep apnea include: Snoring. Waking up often at night. Daytime sleepiness. Pauses in breathing. Choking or gasping during sleep. Irritability. Forgetfulness. Trouble thinking clearly. Depression. Personality changes. Most people with sleep apnea do not know that they have it. What are the advantages of sleep apnea screening? Getting screened for sleep apnea can help: Ensure your safety. It is important for your health care providers to know whether or not you have sleep apnea, especially if you are having surgery or have other long-term (chronic) health conditions. Improve your health and allow you to get a better night's rest. Restful sleep can help you: Have more energy. Lose weight. Improve high blood pressure. Improve diabetes management. Prevent stroke. Prevent car accidents. What  happens during the screening? Screening usually includes being asked a list of questions about your sleep quality. Some questions you may be asked include: Do you snore? Is your sleep restless? Do you have daytime sleepiness? Has a partner or spouse told you that you stop breathing during sleep? Have you had trouble concentrating or memory loss? What is your age? What is your neck circumference? To measure your neck, keep your back straight and gently wrap the tape measure around your neck. Put the tape measure at the middle of your neck, between your chin and collarbone. What is your sex assigned at birth? Do you have or are you being treated for high blood pressure? If your screening test is positive, you are at risk for the condition. Further testing may be needed to confirm a diagnosis of sleep apnea. Where to find more information You can find screening tools online or at your health care clinic. For more information about sleep apnea screening and healthy sleep, visit these websites: Centers for Disease Control and Prevention: FootballExhibition.com.br American Sleep Apnea Association: www.sleepapnea.org Contact a health care provider if: You think that you may have sleep apnea. Summary Sleep apnea screening can help determine if you are at risk for sleep apnea. It is important for your health care providers to know whether or not you have sleep apnea, especially if you are having surgery or have other chronic health conditions.  You may be asked to take a screening test for sleep apnea in preparation for surgery or as part of a physical exam. This information is not intended to replace advice given to you by your health care provider. Make sure you discuss any questions you have with your health care provider. Document Revised: 08/01/2020 Document Reviewed: 08/01/2020 Elsevier Patient Education  2023 [employer].

## 2022-04-07 NOTE — Progress Notes (Signed)
SLEEP MEDICINE CLINIC    Provider:  Melvyn Novas, MD  Primary Care Physician:  Laurann Montana, MD [phone] [place] [address]     Referring Provider: Glean Salvo, Np [address] [place],  [place] [address]          Chief Complaint according to patient   Patient presents with:     New Patient (Initial Visit)     Primary Neurologist is Dr Terrace Arabia, MD PhD, referral by Margie Ege. , NP      HISTORY OF PRESENT ILLNESS:  04-07-2022 [name] is a 71 year- old Caucasian female patient seen upon consultation requested by NP Slack,on 04/07/2022  for a sleep evaluation.  Chief concern according to patient :  " I wake up around 3.30 AM with a massive migraine"     [name]  has a past medical history of Anxiety, Depression, Vertigo, chronic Migraines, Hypothyroidism, Nocturia, and Thyroid disease.   Sleep relevant medical history: Nocturia 5-7 times !!!! Restless sleeper. Tonsillectomy: no ,  cervical spine DDD,    Family medical /sleep history:  no other family member on CPAP with OSA, insomnia, sleep walkers.    Social history:  She grew up in 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 Family status is divorced , with 3 children, 6 grandchildren.  Tobacco use: none. ETOH use quit in 18 months ago, Caffeine intake in form of Coffee( 4-5 cups a day- last at 3 Pm. ) Soda( /) Tea ( /) or energy drinks. Regular exercise in form of walking. Hobbies : cooking, baking, gardening.     Sleep habits are as follows: The patient's dinner time is between 5 PM. The patient goes to bed at 10.30 PM and continues to sleep for 4-5 hours, wakes from severe Headaches at 3.30 AM.   The preferred sleep position is laterally, with the support of 1 pillow.  Dreams are reportedly frequent/vivid.   7 AM is the usual rise time. The patient wakes  up spontaneously.. She reports not feeling refreshed or restored in AM, with symptoms such as dry mouth,  tension - vice on the head "morning headaches", and residual fatigue.  Naps are taken frequently, lasting from 20 to 30 minutes and wakes from snoring.    Review of Systems: Out of a complete 14 system review, the patient complains of only the following symptoms, and all other reviewed systems are negative.:  Fatigue, sleepiness , snoring, fragmented sleep, Insomnia  wakes up from headaches and snoring.    How likely are you to doze in the following situations: 0 = not likely, 1 = slight chance, 2 = moderate chance, 3 = high chance   Sitting and Reading? Watching Television? Sitting inactive in a public place (theater or meeting)? As a passenger in a car for an hour without a break? Lying down in the afternoon when circumstances permit? Sitting and talking to someone? Sitting quietly after lunch without alcohol? In a car, while stopped for a few minutes in traffic?   Total = 5/ 24 points   FSS endorsed at 22/ 63 points.  Geriatric Depression Score  : 6/ 15  points.   Social History   Socioeconomic History   Marital status: Divorced    Spouse name: Not on file  Number of children: 3   Years of education: Not on file   Highest education level: Bachelor's degree (e.g., BA, AB, BS)  Occupational History   Not on file  Tobacco Use   Smoking status: Never   Smokeless tobacco: Never  Vaping Use   Vaping Use: Never used  Substance and Sexual Activity   Alcohol use: Not Currently   Drug use: No   Sexual activity: Not on file  Other Topics Concern   Not on file  Social History Narrative   Lives alone    R handed   Caffeine: 3-4 c of Coffee a day   Social Determinants of Health   Financial Resource Strain: Not on file  Food Insecurity: Not on file  Transportation Needs: Not on file  Physical Activity: Not on file  Stress: Not on file  Social Connections: Not on file     Family History  Problem Relation Age of Onset   Breast cancer Mother    Hashimoto's thyroiditis Mother    Arthritis Mother    Heart attack Father    CAD Father    Stroke Brother     Past Medical History:  Diagnosis Date   Anxiety    Depression    Hypothyroidism    Thyroid disease     Past Surgical History:  Procedure Laterality Date   BACK SURGERY     BUNIONECTOMY Right    CESAREAN SECTION     LEFT HEART CATH AND CORONARY ANGIOGRAPHY N/A 03/15/2022   Procedure: LEFT HEART CATH AND CORONARY ANGIOGRAPHY;  Surgeon: Kathleene Hazel, MD;  Location: [facility];  Service: Cardiovascular;  Laterality: N/A;     Current Outpatient Medications on File Prior to Visit  Medication Sig Dispense Refill   butalbital-acetaminophen-caffeine (FIORICET) 50-325-40 MG tablet Take 1 tablet by mouth every 12 (twelve) hours as needed for headache. 12 tablet 0   diphenhydrAMINE (BENADRYL) 25 MG tablet Take 25 mg by mouth every 6 (six) hours as needed.     Galcanezumab-gnlm (EMGALITY) 120 MG/ML SOAJ Inject 240 mg into the skin every 30 (thirty) days. 2 mL 0   Galcanezumab-gnlm (EMGALITY) 120 MG/ML SOAJ Inject 120 mg into the skin every 30 (thirty) days. 1.12 mL 11   halobetasol (ULTRAVATE) 0.05 % cream Apply topically 2 (two) times daily.     Levothyroxine Sodium 137 MCG CAPS   1   NAPROXEN PO Take by mouth.     ondansetron (ZOFRAN) 8 MG tablet 1 tablets     Ubrogepant (UBRELVY) 100 MG TABS Take 100 mg by mouth as needed (take 1 at onset of headache, max is 200 mg in 24 hours). 10 tablet 11   No current facility-administered medications on file prior to visit.    Allergies  Allergen Reactions   Flagyl [Metronidazole] Anaphylaxis   Hydrocodone-Acetaminophen     Other reaction(s): Confusion, Confusion (intolerance)   Tramadol Nausea Only and Nausea And Vomiting   Alendronate     Other reaction(s): Other (See Comments)   Amoxicillin     rash   Azithromycin     Other  reaction(s): Other (See Comments)   Buspar [Buspirone]     Heart palpations   Buspirone Hcl     Other reaction(s): Irregular Heart Rate   Celebrex [Celecoxib]     Chest pain   Cymbalta [Duloxetine Hcl]     depression   Duloxetine     Other reaction(s): Other (See Comments)   Estradiol     Other  reaction(s): Other (See Comments)   Gabapentin    Hydroxychloroquine     Other reaction(s): Other (See Comments)   Meloxicam Nausea Only   Tizanidine Other (See Comments)    spinning   Topiramate Other (See Comments)    "Spinning" when taken with tizanidine    Physical exam:  Today's Vitals   04/07/22 0955  BP: 110/73  Pulse: 76  Weight: 156 lb 8 oz (71 kg)  Height: 5\' 8"  (1.727 m)   Body mass index is 23.8 kg/m.   Wt Readings from Last 3 Encounters:  04/07/22 156 lb 8 oz (71 kg)  03/23/22 156 lb (70.8 kg)  03/15/22 160 lb (72.6 kg)     Ht Readings from Last 3 Encounters:  04/07/22 5\' 8"  (1.727 m)  03/23/22 5\' 8"  (1.727 m)  03/15/22 5\' 8"  (1.727 m)      General: The patient is awake, alert and appears not in acute distress.  The patient is well groomed. Head: Normocephalic, atraumatic. Neck is supple. Mallampati 2,  neck circumference:14 inches . Nasal airflow  patent.  Retrognathia is  seen.  Dental status: small oral opening ,TMJ and crowded teeth. Bruxism.  Cardiovascular:  Regular rate and cardiac rhythm by pulse,  without distended neck veins. Respiratory: Lungs are clear to auscultation.  Skin:  Without evidence of ankle edema, or rash. Trunk: The patient's posture is erect.   Neurologic exam : The patient is awake and alert, oriented to place and time.   Memory subjective described as intact.  Attention span & concentration ability appears normal.  Speech is fluent,  without  dysarthria, dysphonia or aphasia.  Mood and affect are appropriate.   Cranial nerves: no loss of smell or taste reported  Pupils are equal and briskly reactive to light. Funduscopic  exam deferred, photophobia. Wears sunglasses.   Extraocular movements in vertical and horizontal planes were intact and without nystagmus. No Diplopia. Visual fields by finger perimetry are intact. Hearing was intact to soft voice and finger rubbing.   Facial sensation intact to fine touch.  Facial motor strength is symmetric and tongue and uvula move midline.  Neck ROM : rotation, tilt and flexion extension were normal for age and shoulder shrug was symmetrical.    Motor exam:  Symmetric bulk, tone and ROM.   Normal tone without cog wheeling, symmetric grip strength .   Sensory:  Fine touch, pinprick and vibration were tested  and  normal.  Proprioception tested in the upper extremities was normal.   Coordination: Rapid alternating movements in the fingers/hands were of normal speed.  The Finger-to-nose maneuver was intact without evidence of ataxia, dysmetria or tremor.   Gait and station: Patient could rise unassisted from a seated position, walked without assistive device.  Stance is of normal width/ base and the patient turned with 3 steps.  Toe and heel walk were deferred.  Deep tendon reflexes: in the upper and lower extremities are symmetric and intact.  Babinski response was deferred.       After spending a total time of  45  minutes face to face and additional time for physical and neurologic examination, review of laboratory studies,  personal review of imaging studies, reports and results of other testing and review of referral information / records as far as provided in visit, I have established the following assessments:  1) photophobia, all day headaches, unable to sleep off.  Wakes up from severe headaches, not piercing. Wakes up with headaches - tension like.  2) GERD and nausea , also at night 3) snoring - and nocturia- , very frequent nocturia.  4) depression 5) smoker until recently     My Plan is to proceed with:  1) I read in nurse practitioner Slack's notes  that the patient had already tried and failed several migraine prevention medications she is fairly new to [employer] and we cannot discharge if Emgality works however [employer] can often be combined with Botox for greater effect and we should keep that in mind.  Her MRI of the brain shows small vessel disease and disc for this migraine headaches are a risk factor as well as diabetes or hypertension which I do not find to be present.  She had dizziness spells, photophobia headaches and nausea with an initial evaluation December 23, 2021 by Dr. Maple Hudson.  She had starting in February she has had 5 emergency room visits the most last one on 9 July for severe Vertigo, vomiting and upper left chest pain.   She had shortness of breath at the time diaphoresis elevated troponin was started on heparin and aspirin underwent a cardiac cath found to have an only 20% proximal right CCA stenosis she is not starting on a statin.   Constant tinnitus, headaches that feel like device tightening up around her scalp like a clamp, pain to the right jaw with a history of TMJ and bruxism, small oral opening crowded teeth.   Screen light is a trigger for headaches so she is not exposing herself to that.  She should certainly not be on a triptan given her cardiac history.  I agree with the Emgality even at higher doses I would love for her to give Bernita Raisin trial and now she has 2 pills available but it is important for the differential diagnosis to see how she will respond to Bernita Raisin   I also will order a sleep test either in lab ( recent PVCs , admitted 03-14-2022 through ED with angina, Cardiac arrhythmia question, CAD, recent cardiac event 03-14-2022.)  Plan B .or a home sleep test if her insurance only permits Plan B.   I would like to thank Laurann Montana, MD and Glean Salvo, Np [address] [place],  [place] [address] for allowing me to meet with and to take care of this pleasant patient.   In short, [name] is presenting  with intractable chronic headaches. I plan to follow up either personally or through our NP within 2-3 months after sleep test.   CC: I will share my notes with Dr Terrace Arabia and Margie Ege, NP.  Electronically signed by: Melvyn Novas, MD 04/07/2022 10:13 AM  [facility] and [facility] Board certified by The [employer] of Sleep Medicine and Diplomate of the [employer] of Sleep Medicine. Board certified In Neurology through the ABPN, Fellow of the [employer] of Neurology. Medical Director of [facility].

## 2022-04-08 ENCOUNTER — Encounter: Payer: Self-pay | Admitting: Neurology

## 2022-04-08 DIAGNOSIS — M7061 Trochanteric bursitis, right hip: Secondary | ICD-10-CM | POA: Diagnosis not present

## 2022-04-14 ENCOUNTER — Other Ambulatory Visit: Payer: Self-pay | Admitting: Neurology

## 2022-04-14 NOTE — Telephone Encounter (Signed)
Rx refilled.

## 2022-04-15 ENCOUNTER — Encounter: Payer: Self-pay | Admitting: Neurology

## 2022-04-15 DIAGNOSIS — R002 Palpitations: Secondary | ICD-10-CM | POA: Diagnosis not present

## 2022-04-15 DIAGNOSIS — L2089 Other atopic dermatitis: Secondary | ICD-10-CM | POA: Diagnosis not present

## 2022-04-28 ENCOUNTER — Ambulatory Visit: Payer: Medicare Other | Admitting: Neurology

## 2022-04-28 VITALS — BP 132/82 | HR 69 | Ht 68.0 in | Wt 156.0 lb

## 2022-04-28 DIAGNOSIS — G43109 Migraine with aura, not intractable, without status migrainosus: Secondary | ICD-10-CM | POA: Diagnosis not present

## 2022-04-28 DIAGNOSIS — R519 Headache, unspecified: Secondary | ICD-10-CM

## 2022-04-28 DIAGNOSIS — R42 Dizziness and giddiness: Secondary | ICD-10-CM | POA: Diagnosis not present

## 2022-04-28 NOTE — Patient Instructions (Signed)
Please continue the Emgality for headache prevention  Use Ubrelvy for acute headache treatment Follow up on your sleep study  Return back in 4 months with Dr. Terrace Arabia

## 2022-04-28 NOTE — Progress Notes (Signed)
Patient: [name] Date of Birth: Jan 05, 1951  Reason for Visit: Follow up History from: Patient Primary Neurologist: Dr. Krista Blue   ASSESSMENT AND PLAN 71 y.o. year old female   1.  Acute onset of dizziness with moderate to severe prolonged headaches, light sensitivity, nausea -Felt consistent with migraine variant -2 injections of Emgality thus far, has noted benefit, about 25% better, last week had 3 days with 0 headache, discussed use up to 3 to 4 months to see full benefit -Use Ubrelvy at onset of symptoms -Pending sleep study -Previously tried and failed: Topamax, did not want to continue Fioricet due to concern that it was a barbiturate, unwilling to try Effexor due to side effect from antidepressants, Nurtec was too expensive, nerve block -Reportedly labs from PCP 03/19/22 ESR, CRP were normal, 01/12/22 labs from PCP showed normal CRP, ESR slightly elevated at 23 -MRI brain showed small vessel disease most consistent with migraine headaches -I would like for her to see Dr. Krista Blue in about 4 months for follow-up ensure continued improvement  HISTORY  [name], is a 71 year old female seen in request by her primary care physician Dr. Harlan Stains, for evaluation of dizziness, headaches, initial evaluation was on December 23, 2021   I reviewed and summarized the referring note. PMHX. Hypothyroidism Depression,  Lumbar decompression surgery in 2013.   She had her first onset of dizziness on October 27, 2021, she stepped out of the car turning towards the left, then had sudden onset dizziness, felt her whole brain was in rotation, her neighbor was able to help drive her back home, she was able to walk into her house, symptoms persist and few hours later, she was evaluated at emergency room  Personally reviewed CT head without contrast, no acute intracranial abnormality noted   But her dizziness sensation, later light sensitivity headache persistent for few days, she was able  to see by her ophthalmologist, there was no significant abnormality noted, her symptoms gradually improved after few days   She went to vestibular rehab in March 2023, overall has much improved  About a week ago, she began to experience similar sensation again, holoacranial headaches, unsteady sensation, extreme light sensitivity, neck muscle tension, also retro-orbital area pain,  She tried naproxen with limited help, also have nausea, she denies difficulty talking, using her arms and legs   She denies a history of typical migraine in the past,  Update Jan 25, 2022 SS: Laboratory evaluation at office visit in December 23, 2021 showed significantly elevated CRP to 9, ESR 48, ALT 50, UA showed large leukocyte many bacteria, positive for E. Coli. Repeat labs 12/28/21 CRP 100, ESR 102.  MRI of the brain showed no acute abnormality, there was small vessel disease most consistent with history of migraine headaches.  In ER 01/01/22 for low BP from PCP, was 121/78 in ER, given IV fluids, IV Compazine, Benadryl, Toradol.  01/12/22, ESR slightly elevated 23, CRP normal. Normal CMP creatinine 0.61.  Feels daily headache, are mild, gets woken up by horrible migraine at 3-4 AM, most nights, like band around back of head, tried different pillow, tried sleeping on couch, persistent since 1st office visit, drinks water, is miserable, Fioricet helped in past. Snores at night, mouth is dry upon wakening.   Couldn't get Nurtec, was too expensive. Took Topamax 100 mg twice daily only 1 day, felt like she was on drugs. Feels each day gets better, initially couldn't walk had to use walking poles. Yesterday walked  2 miles without sticks. Went to church yesterday, the bright light triggered horrible migraine. Wearing dark sunglasses today. No vomiting in 2.5 weeks with headache.   Wants to be back to normal, balance isn't steady. Driving yesterday, saw black and white lines in the street, triggered spinning in her head.    Update March 23, 2022 SS: Admitted 03/14/22 for severe left upper chest pain, SOB, emesis, diaphoresis, and elevated troponin.  Started on heparin and aspirin.  Underwent cardiac cath, 20% proximal right CCA stenosis, continue on aspirin, she refused statin.   Claims high pitched sound in her ears constant (has seen ENT), has headache most of the time feels like clamp, pain to right jaw for over 1 year, had molar removed, told vertical root fracture. Fluorescent light triggers, if she closes her eyes, can see bright light then recedes, can be staircase. Wears sunglasses. Lives alone, keeps her apartment dark, screens are triggers. All of a sudden brain starts "swirling", room spins counter clock wise. On 03/14/22 this happened, began violent vomiting, took her meclizine. Then developed CP (see above, for positive troponin). This terrible spell happened on heavy rain weather change, power was off, then was looking at laptop when symptoms started. PCP saw 03/19/22, patient reports ESR, CRP, TSH were now normal.   After last office visit with me 01/25/22 could not tolerate even Topamax 50 mg at bedtime, explained flashing lights in the brain, could not sleep.  Is not willing to try Effexor.  Update April 28, 2022 SS: Saw Dr. Brett Fairy for sleep consult 04/07/22 sleep study was ordered. Started Emgality in July loading dose, did 2nd last week. Last week had 3 days of ZERO headache. Sunday woke up in middle of night with headache, wanted to go to church, felt okay until lights changed, 4 hours of disoriented, didn't take the [place]. Yesterday woke up feeling same, headache worsening, felt areas in head "opening up", definitely reduced it. Is sleeping better, getting up few times to urinate. Offered amitriptyline by Dr. Brett Fairy. Seems to be moving about more freely.   REVIEW OF SYSTEMS: Out of a complete 14 system review of symptoms, the patient complains only of the following symptoms, and all other reviewed systems  are negative.  See HPI  ALLERGIES: Allergies  Allergen Reactions   Flagyl [Metronidazole] Anaphylaxis   Hydrocodone-Acetaminophen     Other reaction(s): Confusion, Confusion (intolerance)   Tramadol Nausea Only and Nausea And Vomiting   Alendronate     Other reaction(s): Other (See Comments)   Amoxicillin     rash   Azithromycin     Other reaction(s): Other (See Comments)   Buspar [Buspirone]     Heart palpations   Buspirone Hcl     Other reaction(s): Irregular Heart Rate   Celebrex [Celecoxib]     Chest pain   Cymbalta [Duloxetine Hcl]     depression   Duloxetine     Other reaction(s): Other (See Comments)   Estradiol     Other reaction(s): Other (See Comments)   Gabapentin    Hydroxychloroquine     Other reaction(s): Other (See Comments)   Meloxicam Nausea Only   Tizanidine Other (See Comments)    spinning   Topiramate Other (See Comments)    "Spinning" when taken with tizanidine    HOME MEDICATIONS: Outpatient Medications Prior to Visit  Medication Sig Dispense Refill   butalbital-acetaminophen-caffeine (FIORICET) 50-325-40 MG tablet Take 1 tablet by mouth every 12 (twelve) hours as needed for headache. 12 tablet 0  diphenhydrAMINE (BENADRYL) 25 MG tablet Take 25 mg by mouth every 6 (six) hours as needed.     EMGALITY 120 MG/ML SOAJ INJECT 240 MG IN TO THE SKIN EVERY 30 DAYS 2 mL 0   Galcanezumab-gnlm (EMGALITY) 120 MG/ML SOAJ Inject 120 mg into the skin every 30 (thirty) days. 1.12 mL 11   halobetasol (ULTRAVATE) 0.05 % cream Apply topically 2 (two) times daily.     Levothyroxine Sodium 137 MCG CAPS   1   NAPROXEN PO Take by mouth.     ondansetron (ZOFRAN) 8 MG tablet 1 tablets     Ubrogepant (UBRELVY) 100 MG TABS Take 100 mg by mouth as needed (take 1 at onset of headache, max is 200 mg in 24 hours). 10 tablet 11   No facility-administered medications prior to visit.    PAST MEDICAL HISTORY: Past Medical History:  Diagnosis Date   Anxiety    Depression     Hypothyroidism    Thyroid disease     PAST SURGICAL HISTORY: Past Surgical History:  Procedure Laterality Date   BACK SURGERY     BUNIONECTOMY Right    CESAREAN SECTION     LEFT HEART CATH AND CORONARY ANGIOGRAPHY N/A 03/15/2022   Procedure: LEFT HEART CATH AND CORONARY ANGIOGRAPHY;  Surgeon: Burnell Blanks, MD;  Location: [facility];  Service: Cardiovascular;  Laterality: N/A;    FAMILY HISTORY: Family History  Problem Relation Age of Onset   Breast cancer Mother    Hashimoto's thyroiditis Mother    Arthritis Mother    Heart attack Father    CAD Father    Stroke Brother     SOCIAL HISTORY: Social History   Socioeconomic History   Marital status: Divorced    Spouse name: Not on file   Number of children: 3   Years of education: Not on file   Highest education level: Bachelor's degree (e.g., BA, AB, BS)  Occupational History   Not on file  Tobacco Use   Smoking status: Never   Smokeless tobacco: Never  Vaping Use   Vaping Use: Never used  Substance and Sexual Activity   Alcohol use: Not Currently   Drug use: No   Sexual activity: Not on file  Other Topics Concern   Not on file  Social History Narrative   Lives alone    R handed   Caffeine: 3-4 c of Coffee a day   Social Determinants of Health   Financial Resource Strain: Not on file  Food Insecurity: Not on file  Transportation Needs: Not on file  Physical Activity: Not on file  Stress: Not on file  Social Connections: Not on file  Intimate Partner Violence: Not on file   PHYSICAL EXAM  Vitals:   04/28/22 1505  BP: 132/82  Pulse: 69  Weight: 156 lb (70.8 kg)  Height: '5\' 8"'  (1.727 m)   Body mass index is 23.72 kg/m.  Generalized: Well developed, in no acute distress, wearing dark sunglasses,  seems to be moving about more freely Neurological examination  Mentation: Alert oriented to time, place, history taking. Follows all commands speech and language fluent Cranial nerve  II-XII: Pupils were equal round reactive to light. Extraocular movements were full, visual field were full on confrontational test. Facial sensation and strength were normal. Head turning and shoulder shrug were normal and symmetric. Motor: The motor testing reveals 5 over 5 strength of all 4 extremities. Good symmetric motor tone is noted throughout.  Sensory: Sensory testing  is intact to soft touch on all 4 extremities. No evidence of extinction is noted.  Coordination: Cerebellar testing reveals good finger-nose-finger and heel-to-shin bilaterally.  Gait and station: Gait is normal, but cautious. Tandem gait is unsteady. Reflexes: Deep tendon reflexes are symmetric and normal bilaterally.   DIAGNOSTIC DATA (LABS, IMAGING, TESTING) - I reviewed patient records, labs, notes, testing and imaging myself where available.  Lab Results  Component Value Date   WBC 6.8 03/15/2022   HGB 12.8 03/15/2022   HCT 37.2 03/15/2022   MCV 91.0 03/15/2022   PLT 261 03/15/2022      Component Value Date/Time   NA 142 03/15/2022 0507   NA 138 12/28/2021 1458   K 3.7 03/15/2022 0507   CL 110 03/15/2022 0507   CO2 24 03/15/2022 0507   GLUCOSE 102 (H) 03/15/2022 0507   BUN 8 03/15/2022 0507   BUN 12 12/28/2021 1458   CREATININE 0.69 03/15/2022 0507   CALCIUM 8.8 (L) 03/15/2022 0507   PROT 6.5 03/14/2022 1935   PROT 6.2 12/28/2021 1458   ALBUMIN 3.6 03/14/2022 1935   ALBUMIN 3.3 (L) 12/28/2021 1458   AST 23 03/14/2022 1935   ALT 18 03/14/2022 1935   ALKPHOS 76 03/14/2022 1935   BILITOT 0.5 03/14/2022 1935   BILITOT 0.4 12/28/2021 1458   GFRNONAA >60 03/15/2022 0507   GFRAA >90 08/15/2013 0400   Lab Results  Component Value Date   CHOL 193 03/15/2022   HDL 66 03/15/2022   LDLCALC 119 (H) 03/15/2022   TRIG 39 03/15/2022   CHOLHDL 2.9 03/15/2022   No results found for: "HGBA1C" No results found for: "VITAMINB12" Lab Results  Component Value Date   TSH 1.656 03/15/2022    Butler Denmark,  AGNP-C, DNP 04/28/2022, 3:36 PM [facility] [address], [place], [place] [address] [phone]

## 2022-04-29 ENCOUNTER — Other Ambulatory Visit: Payer: Self-pay | Admitting: Neurology

## 2022-04-29 NOTE — Telephone Encounter (Signed)
spoke to the patient she states she wants to contact her insurnace to see how much the study will cost   [employer] medicare no auth req ref # Vince P on 04/29/22

## 2022-04-29 NOTE — Addendum Note (Signed)
Addended by: Judi Cong on: 04/29/2022 11:14 AM   Modules accepted: Orders

## 2022-04-29 NOTE — Telephone Encounter (Signed)
NPSG- [employer] medicare no auth req ref # Vince P on 04/29/22.  Patient is scheduled at [facility] for 05/14/22 at 8 pm.  Mailed packet to the patient  She also stated that Dr. Vickey Huger mention starting a anti depression but she doesn't want to go on it full time but she didn't know if there was something she could take short term to help her with this study, because she is anxious about it.

## 2022-04-29 NOTE — Telephone Encounter (Addendum)
I have sent a medication request to Dr Vickey Huger to see if she would be ok with this.    I am ok, sending script to her neighborhood pharmacy now . Melvyn Novas, MD

## 2022-04-30 ENCOUNTER — Encounter: Payer: Self-pay | Admitting: Physician Assistant

## 2022-04-30 NOTE — Progress Notes (Unsigned)
Cardiology Office Note:    Date:  05/03/2022   ID:  MOE BRIER, DOB May 29, 1951, MRN [id number]  PCP:  Laurann Montana, MD  [facility] Cardiologist:  Lesleigh Noe, MD  [facility] Electrophysiologist:  None   Chief Complaint: Hospital follow up   History of Present Illness:    [name] is a 71 y.o. female with a hx of seen for hospital follow up.   Hx of hypothyroidism, anxiety, depression who presented with vertigo followed by severe left upper chest discomfort with associated shortness of breath, emesis and diaphoresis. HsTn 43>>91, underwent cardiac catheterization with 20% proximal RCA stenosis. Recommendations for medical management. Echo with preserved LVEF. Continue aspirin and atorvastatin 20 mg daily.  Here today for follow up.  Following discharge she did not have further chest pain episode.  She is dealing with severe migraine.  Has pending sleep study.  She has self discontinued aspirin and statin and does not wish to restart.  She was seen by PCP on August 10 and noted to have "arrhythmia".  No EKG done. No office visit note available for review.   Past Medical History:  Diagnosis Date   Anxiety    Depression    Hypothyroidism    Mild CAD    cath 03/2022: 20% proximal RCA stenosis   Thyroid disease     Past Surgical History:  Procedure Laterality Date   BACK SURGERY     BUNIONECTOMY Right    CESAREAN SECTION     LEFT HEART CATH AND CORONARY ANGIOGRAPHY N/A 03/15/2022   Procedure: LEFT HEART CATH AND CORONARY ANGIOGRAPHY;  Surgeon: Kathleene Hazel, MD;  Location: [facility]VAS[facility];  Service: Cardiovascular;  Laterality: N/A;    Current Medications: Current Meds  Medication Sig   diphenhydrAMINE (BENADRYL) 25 MG tablet Take 25 mg by mouth every 6 (six) hours as needed.   Galcanezumab-gnlm (EMGALITY) 120 MG/ML SOAJ Inject 120 mg into the skin every 30 (thirty) days.   halobetasol (ULTRAVATE) 0.05 % cream Apply topically 2 (two) times  daily.   Levothyroxine Sodium 137 MCG CAPS    ondansetron (ZOFRAN) 8 MG tablet 1 tablets   Ubrogepant (UBRELVY) 100 MG TABS Take 100 mg by mouth as needed (take 1 at onset of headache, max is 200 mg in 24 hours).     Allergies:   Flagyl [metronidazole], Hydrocodone-acetaminophen, Tramadol, Alendronate, Amoxicillin, Azithromycin, Buspar [buspirone], Buspirone hcl, Celebrex [celecoxib], Cymbalta [duloxetine hcl], Duloxetine, Estradiol, Gabapentin, Hydroxychloroquine, Meloxicam, Tizanidine, and Topiramate   Social History   Socioeconomic History   Marital status: Divorced    Spouse name: Not on file   Number of children: 3   Years of education: Not on file   Highest education level: Bachelor's degree (e.g., BA, AB, BS)  Occupational History   Not on file  Tobacco Use   Smoking status: Never   Smokeless tobacco: Never  Vaping Use   Vaping Use: Never used  Substance and Sexual Activity   Alcohol use: Not Currently   Drug use: No   Sexual activity: Not on file  Other Topics Concern   Not on file  Social History Narrative   Lives alone    R handed   Caffeine: 3-4 c of Coffee a day   Social Determinants of Health   Financial Resource Strain: Not on file  Food Insecurity: Not on file  Transportation Needs: Not on file  Physical Activity: Not on file  Stress: Not on file  Social Connections:  Not on file     Family History: The patient's family history includes Arthritis in her mother; Breast cancer in her mother; CAD in her father; Hashimoto's thyroiditis in her mother; Heart attack in her father; Stroke in her brother.    ROS:   Please see the history of present illness.    All other systems reviewed and are negative.   EKGs/Labs/Other Studies Reviewed:    The following studies were reviewed today: Cath: 03/15/22     Prox RCA lesion is 20% stenosed.   Mild non-obstructive CAD Normal LVEDP   Recommendations: Medical management of mild CAD.    Echo: 03/15/22    IMPRESSIONS     1. Left ventricular ejection fraction, by estimation, is 60 to 65%. The  left ventricle has normal function. The left ventricle has no regional  wall motion abnormalities. There is mild left ventricular hypertrophy.  Left ventricular diastolic parameters  were normal.   2. Right ventricular systolic function is normal. The right ventricular  size is normal.   3. Left atrial size was mildly dilated.   4. The mitral valve is normal in structure. Trivial mitral valve  regurgitation.   5. The aortic valve was not well visualized. Aortic valve regurgitation  is trivial. No aortic stenosis is present.   6. The inferior vena cava is dilated in size with <50% respiratory  variability, suggesting right atrial pressure of 15 mmHg.    EKG:  EKG is not ordered today.  Recent Labs: 03/14/2022: ALT 18 03/15/2022: B Natriuretic Peptide 81.0; BUN 8; Creatinine, Ser 0.69; Hemoglobin 12.8; Platelets 261; Potassium 3.7; Sodium 142; TSH 1.656  Recent Lipid Panel    Component Value Date/Time   CHOL 193 03/15/2022 0507   TRIG 39 03/15/2022 0507   HDL 66 03/15/2022 0507   CHOLHDL 2.9 03/15/2022 0507   VLDL 8 03/15/2022 0507   LDLCALC 119 (H) 03/15/2022 0507   Physical Exam:    VS:  BP 110/70   Pulse 65   Ht 5\' 8"  (1.727 m)   Wt 154 lb 3.2 oz (69.9 kg)   SpO2 98%   BMI 23.45 kg/m     Wt Readings from Last 3 Encounters:  05/03/22 154 lb 3.2 oz (69.9 kg)  04/28/22 156 lb (70.8 kg)  04/07/22 156 lb 8 oz (71 kg)     GEN:  Well nourished, well developed in no acute distress HEENT: Normal NECK: No JVD; No carotid bruits LYMPHATICS: No lymphadenopathy CARDIAC: RRR, no murmurs, rubs, gallops RESPIRATORY:  Clear to auscultation without rales, wheezing or rhonchi  ABDOMEN: Soft, non-tender, non-distended MUSCULOSKELETAL:  No edema; No deformity  SKIN: Warm and dry NEUROLOGIC:  Alert and oriented x 3 PSYCHIATRIC:  Normal affect   ASSESSMENT AND PLAN:    Mild nonobstructive  CAD No recurrent chest pain.  She does not wish to restart aspirin or statin.  2.  Arrhythmia Patient was told to have "arrhythmia" at PCP office on August 10.  No EKG or office not available for review.  There was no EKG done at that time.  She is in sinus rhythm on exam today.  After long discussion patient decided to get 2 weeks of ZIO monitor. TSH was normal last month.  3.  Migraine -Followed by neurology  Medication Adjustments/Labs and Tests Ordered: Current medicines are reviewed at length with the patient today.  Concerns regarding medicines are outlined above.  Orders Placed This Encounter  Procedures   LONG TERM MONITOR (3-14 DAYS)  No orders of the defined types were placed in this encounter.   Patient Instructions  Medication Instructions:  Your physician recommends that you continue on your current medications as directed. Please refer to the Current Medication list given to you today.  *If you need a refill on your cardiac medications before your next appointment, please call your pharmacy*  Lab Work: If you have labs (blood work) drawn today and your tests are completely normal, you will receive your results only by: MyChart Message (if you have MyChart) OR A paper copy in the mail If you have any lab test that is abnormal or we need to change your treatment, we will call you to review the results.  Testing/Procedures: Your physician has recommended that you wear a 2 week monitor. Cardiac monitors are medical devices that record the heart's electrical activity. Doctors most often [place] these monitors to diagnose arrhythmias. Arrhythmias are problems with the speed or rhythm of the heartbeat. The monitor is a small, portable device. You can wear one while you do your normal daily activities. This is usually used to diagnose what is causing palpitations/syncope (passing out).  Follow-Up: At [facility], you and your health needs are our priority.  As part of  our continuing mission to provide you with exceptional heart care, we have created designated Provider Care Teams.  These Care Teams include your primary Cardiologist (physician) and Advanced Practice Providers (APPs -  Physician Assistants and Nurse Practitioners) who all work together to provide you with the care you need, when you need it.  We recommend signing up for the patient portal called "MyChart".  Sign up information is provided on this After Visit Summary.  MyChart is used to connect with patients for Virtual Visits (Telemedicine).  Patients are able to view lab/test results, encounter notes, upcoming appointments, etc.  Non-urgent messages can be sent to your provider as well.   To learn more about what you can do with MyChart, go to [email or web address].    Your next appointment:   1 month(s)  The format for your next appointment:   In Person  Provider:   Chelsea Aus, PA-C     Other Instructions ZIO XT- Long Term Monitor Instructions  Your physician has requested you wear a ZIO patch monitor for 14 days.  This is a single patch monitor. [employer] supplies one patch monitor per enrollment. Additional stickers are not available. Please do not apply patch if you will be having a Nuclear Stress Test,  Echocardiogram, Cardiac CT, MRI, or Chest Xray during the period you would be wearing the  monitor. The patch cannot be worn during these tests. You cannot remove and re-apply the  ZIO XT patch monitor.  Your ZIO patch monitor will be mailed 3 day USPS to your address on file. It may take 3-5 days  to receive your monitor after you have been enrolled.  Once you have received your monitor, please review the enclosed instructions. Your monitor  has already been registered assigning a specific monitor serial # to you.  Billing and Patient Assistance Program Information  We have supplied [employer] with any of your insurance information on file for billing purposes. [employer] offers a  sliding scale Patient Assistance Program for patients that do not have  insurance, or whose insurance does not completely cover the cost of the ZIO monitor.  You must apply for the Patient Assistance Program to qualify for this discounted rate.  To apply, please call [employer] at [phone], select  option 4, select option 2, ask to apply for  Patient Assistance Program. Meredeth Ide will ask your household income, and how many people  are in your household. They will quote your out-of-pocket cost based on that information.  [employer] will also be able to set up a 25-month, interest-free payment plan if needed.  Applying the monitor   Shave hair from upper left chest.  Hold abrader disc by orange tab. Rub abrader in 40 strokes over the upper left chest as  indicated in your monitor instructions.  Clean area with 4 enclosed alcohol pads. Let dry.  Apply patch as indicated in monitor instructions. Patch will be placed under collarbone on left  side of chest with arrow pointing upward.  Rub patch adhesive wings for 2 minutes. Remove white label marked "1". Remove the white  label marked "2". Rub patch adhesive wings for 2 additional minutes.  While looking in a mirror, press and release button in center of patch. A small green light will  flash 3-4 times. This will be your only indicator that the monitor has been turned on.  Do not shower for the first 24 hours. You may shower after the first 24 hours.  Press the button if you feel a symptom. You will hear a small click. Record Date, Time and  Symptom in the Patient Logbook.  When you are ready to remove the patch, follow instructions on the last 2 pages of Patient  Logbook. Stick patch monitor onto the last page of Patient Logbook.  Place Patient Logbook in the blue and white box. Use locking tab on box and tape box closed  securely. The blue and white box has prepaid postage on it. Please place it in the mailbox as  soon as possible. Your physician  should have your test results approximately 7 days after the  monitor has been mailed back to [facility].  Call [facility] Customer Care at [phone] if you have questions regarding  your ZIO XT patch monitor. Call them immediately if you see an orange light blinking on your  monitor.  If your monitor falls off in less than 4 days, contact our Monitor department at [phone].  If your monitor becomes loose or falls off after 4 days call [employer] at [phone] for  suggestions on securing your monitor   Important Information About Sugar         Lorelei Pont, PA  05/03/2022 1:51 PM    [facility]

## 2022-05-03 ENCOUNTER — Telehealth: Payer: Self-pay | Admitting: Neurology

## 2022-05-03 ENCOUNTER — Ambulatory Visit: Payer: Medicare Other | Attending: Physician Assistant | Admitting: Physician Assistant

## 2022-05-03 ENCOUNTER — Ambulatory Visit: Payer: Medicare Other | Attending: Physician Assistant

## 2022-05-03 ENCOUNTER — Encounter: Payer: Self-pay | Admitting: Physician Assistant

## 2022-05-03 VITALS — BP 110/70 | HR 65 | Ht 68.0 in | Wt 154.2 lb

## 2022-05-03 DIAGNOSIS — R42 Dizziness and giddiness: Secondary | ICD-10-CM | POA: Diagnosis not present

## 2022-05-03 DIAGNOSIS — I2510 Atherosclerotic heart disease of native coronary artery without angina pectoris: Secondary | ICD-10-CM | POA: Diagnosis not present

## 2022-05-03 DIAGNOSIS — I499 Cardiac arrhythmia, unspecified: Secondary | ICD-10-CM

## 2022-05-03 MED ORDER — ALPRAZOLAM 0.5 MG PO TABS: 0.5000 mg | ORAL_TABLET | Freq: Once | ORAL | 0 refills | Status: DC | PRN

## 2022-05-03 NOTE — Addendum Note (Signed)
Addended by: Melvyn Novas on: 05/03/2022 04:18 PM   Modules accepted: Orders

## 2022-05-03 NOTE — Telephone Encounter (Signed)
[facility]ter) we have two prescription for ALPRAZolam (XANAX) 0.5 MG tablet. Received prescription today 12:15p for 5 tablet, and receive another at 4:15p for 2 tablets. Would like a call from the nurse to verify which prescription to fill

## 2022-05-03 NOTE — Addendum Note (Signed)
Addended by: Melvyn Novas on: 05/03/2022 12:14 PM   Modules accepted: Orders

## 2022-05-03 NOTE — Progress Notes (Unsigned)
Enrolled for [employer] to mail a ZIO XT long term holter monitor to the patients address on file.   Dr. Smith to read. 

## 2022-05-03 NOTE — Patient Instructions (Addendum)
Medication Instructions:  Your physician recommends that you continue on your current medications as directed. Please refer to the Current Medication list given to you today.  *If you need a refill on your cardiac medications before your next appointment, please call your pharmacy*  Lab Work: If you have labs (blood work) drawn today and your tests are completely normal, you will receive your results only by: MyChart Message (if you have MyChart) OR A paper copy in the mail If you have any lab test that is abnormal or we need to change your treatment, we will call you to review the results.  Testing/Procedures: Your physician has recommended that you wear a 2 week monitor. Cardiac monitors are medical devices that record the heart's electrical activity. Doctors most often [place] these monitors to diagnose arrhythmias. Arrhythmias are problems with the speed or rhythm of the heartbeat. The monitor is a small, portable device. You can wear one while you do your normal daily activities. This is usually used to diagnose what is causing palpitations/syncope (passing out).  Follow-Up: At [facility], you and your health needs are our priority.  As part of our continuing mission to provide you with exceptional heart care, we have created designated Provider Care Teams.  These Care Teams include your primary Cardiologist (physician) and Advanced Practice Providers (APPs -  Physician Assistants and Nurse Practitioners) who all work together to provide you with the care you need, when you need it.  We recommend signing up for the patient portal called "MyChart".  Sign up information is provided on this After Visit Summary.  MyChart is used to connect with patients for Virtual Visits (Telemedicine).  Patients are able to view lab/test results, encounter notes, upcoming appointments, etc.  Non-urgent messages can be sent to your provider as well.   To learn more about what you can do with MyChart, go to  [email or web address].    Your next appointment:   1 month(s)  The format for your next appointment:   In Person  Provider:   Chelsea Aus, PA-C     Other Instructions ZIO XT- Long Term Monitor Instructions  Your physician has requested you wear a ZIO patch monitor for 14 days.  This is a single patch monitor. [employer] supplies one patch monitor per enrollment. Additional stickers are not available. Please do not apply patch if you will be having a Nuclear Stress Test,  Echocardiogram, Cardiac CT, MRI, or Chest Xray during the period you would be wearing the  monitor. The patch cannot be worn during these tests. You cannot remove and re-apply the  ZIO XT patch monitor.  Your ZIO patch monitor will be mailed 3 day USPS to your address on file. It may take 3-5 days  to receive your monitor after you have been enrolled.  Once you have received your monitor, please review the enclosed instructions. Your monitor  has already been registered assigning a specific monitor serial # to you.  Billing and Patient Assistance Program Information  We have supplied [employer] with any of your insurance information on file for billing purposes. [employer] offers a sliding scale Patient Assistance Program for patients that do not have  insurance, or whose insurance does not completely cover the cost of the ZIO monitor.  You must apply for the Patient Assistance Program to qualify for this discounted rate.  To apply, please call [employer] at [phone], select option 4, select option 2, ask to apply for  Patient Assistance Program. Meredeth Ide will ask your  household income, and how many people  are in your household. They will quote your out-of-pocket cost based on that information.  [employer] will also be able to set up a 37-month, interest-free payment plan if needed.  Applying the monitor   Shave hair from upper left chest.  Hold abrader disc by orange tab. Rub abrader in 40 strokes over the upper left  chest as  indicated in your monitor instructions.  Clean area with 4 enclosed alcohol pads. Let dry.  Apply patch as indicated in monitor instructions. Patch will be placed under collarbone on left  side of chest with arrow pointing upward.  Rub patch adhesive wings for 2 minutes. Remove white label marked "1". Remove the white  label marked "2". Rub patch adhesive wings for 2 additional minutes.  While looking in a mirror, press and release button in center of patch. A small green light will  flash 3-4 times. This will be your only indicator that the monitor has been turned on.  Do not shower for the first 24 hours. You may shower after the first 24 hours.  Press the button if you feel a symptom. You will hear a small click. Record Date, Time and  Symptom in the Patient Logbook.  When you are ready to remove the patch, follow instructions on the last 2 pages of Patient  Logbook. Stick patch monitor onto the last page of Patient Logbook.  Place Patient Logbook in the blue and white box. Use locking tab on box and tape box closed  securely. The blue and white box has prepaid postage on it. Please place it in the mailbox as  soon as possible. Your physician should have your test results approximately 7 days after the  monitor has been mailed back to [facility].  Call [facility] Customer Care at [phone] if you have questions regarding  your ZIO XT patch monitor. Call them immediately if you see an orange light blinking on your  monitor.  If your monitor falls off in less than 4 days, contact our Monitor department at [phone].  If your monitor becomes loose or falls off after 4 days call [employer] at [phone] for  suggestions on securing your monitor   Important Information About Sugar

## 2022-05-03 NOTE — Addendum Note (Signed)
Addended by: Judi Cong on: 05/03/2022 02:09 PM   Modules accepted: Orders

## 2022-05-04 NOTE — Telephone Encounter (Signed)
Called the pharmacist back to advise the 2 tablets is all that is needed. She is using this for her upcoming sleep study to help her with sleep. They will cancel the one for 5

## 2022-05-05 ENCOUNTER — Encounter: Payer: Self-pay | Admitting: Neurology

## 2022-05-05 DIAGNOSIS — R42 Dizziness and giddiness: Secondary | ICD-10-CM

## 2022-05-05 DIAGNOSIS — I499 Cardiac arrhythmia, unspecified: Secondary | ICD-10-CM

## 2022-05-14 ENCOUNTER — Ambulatory Visit (INDEPENDENT_AMBULATORY_CARE_PROVIDER_SITE_OTHER): Payer: Medicare Other | Admitting: Neurology

## 2022-05-14 DIAGNOSIS — G4701 Insomnia due to medical condition: Secondary | ICD-10-CM

## 2022-05-14 DIAGNOSIS — G43109 Migraine with aura, not intractable, without status migrainosus: Secondary | ICD-10-CM | POA: Diagnosis not present

## 2022-05-14 DIAGNOSIS — R519 Headache, unspecified: Secondary | ICD-10-CM

## 2022-05-14 DIAGNOSIS — I25119 Atherosclerotic heart disease of native coronary artery with unspecified angina pectoris: Secondary | ICD-10-CM

## 2022-05-14 DIAGNOSIS — R0683 Snoring: Secondary | ICD-10-CM | POA: Diagnosis not present

## 2022-05-14 DIAGNOSIS — G4763 Sleep related bruxism: Secondary | ICD-10-CM

## 2022-05-14 DIAGNOSIS — R351 Nocturia: Secondary | ICD-10-CM

## 2022-05-21 DIAGNOSIS — N764 Abscess of vulva: Secondary | ICD-10-CM | POA: Diagnosis not present

## 2022-05-24 DIAGNOSIS — R42 Dizziness and giddiness: Secondary | ICD-10-CM | POA: Diagnosis not present

## 2022-05-24 DIAGNOSIS — I499 Cardiac arrhythmia, unspecified: Secondary | ICD-10-CM | POA: Diagnosis not present

## 2022-05-24 NOTE — Progress Notes (Signed)
IMPRESSION:  1) Sleep disordered breathing was not present. Obstructive Sleep Apnea didn't reach a clinically significant degree ,there was no hypoxia. 2) REM sleep was present at 25% of total sleep time, a normal proportion.  3)Intermittent sinus bradycardia was seen, not to a critical level.  4)Total sleep time was reducedat 306.55minutes. Sleep efficiency was decreasedat 67.5%.Sleep fragmentation was noted- The majority of sleep arousals was related to spontaneous arousals andunrelated to EKG, EEG, EMG physiological changes. These arousals clustered between 2 AM and 3.50 AM   RECOMMENDATIONS: There was no significant sleep disordered breathing, no cardiac dysrhythmia, and only dew isolated non- periodic Limb Movements seen. I can not find a physiological cause for the arousals leading up to the 2.30-3.30 sleep interruptions, aside from 3 times ofnocturia. i would suggest a tricyclic medication to help with tension type headaches and insomnia as well as reduction in nocturia. Consider imipramine.    Zedekiah Hinderman, MD

## 2022-05-24 NOTE — Procedures (Signed)
[facility] POLYSOMNOGRAPHY  INTERPRETATION REPORT   STUDY DATE:  05/14/2022     PATIENT NAME:  [name]         DATE OF BIRTH:  [birth year]  PATIENT ID:  [id number]    TYPE OF STUDY:  PSG  READING PHYSICIAN: Larey Seat, MD REFERRED BY: Butler Denmark, NP. The primary Neurologist is Dr Krista Blue, MD PhD SCORING TECHNICIAN: Gaylyn Cheers, RPSGT   HISTORY:   [name] is a 71 year-old Female patient  with migraines waking her at night (3.30 Am ) , Nocturia to 7 times, frequent daytime naps, snoring, vivid dreams. Past medical history  :  ADDITIONAL INFORMATION:  The Epworth Sleepiness Scale endorsed at at  5 /24 points (scores above or equal to 10 are suggestive of hypersomnolence). FSS endorsed at 22  /63 points. GDS endorsed at 6/ 15 points,   Height: 68 in Weight: 156 lb (BMI 23) Neck Size: 14 in  MEDICATIONS: Fioricet, Benadryl, Emgality, Ultravate, Levothyroxine sodium, Naproxen, Ubrelvy TECHNICAL DESCRIPTION: A registered sleep technologist ( RPSGT)  was in attendance for the duration of the recording.  Data collection, scoring, video monitoring, and reporting were performed in compliance with the AASM Manual for the Scoring of Sleep and Associated Events; (Hypopnea is scored based on the criteria listed in Section VIII D. 1b in the AASM Manual V2.6 using a 4% oxygen desaturation rule or Hypopnea is scored based on the criteria listed in Section VIII D. 1a in the AASM Manual V2.6 using 3% oxygen desaturation and /or arousal rule).   SLEEP CONTINUITY AND SLEEP ARCHITECTURE:  Lights-out was at 21:53: and lights-on at  05:26:, with  7.6 hours ( 440 minutes)  of recording time . Total sleep time ( TST) was 306.0 minutes with a decreased sleep efficiency at 67.5%.   Sleep latency was increased at 79.0 minutes.  REM sleep latency was increased at 109.5 minutes. Of the total sleep time, the percentage of stage N1 sleep was 5.4%, stage N2 sleep was 69%, stage  N3 sleep was 0.0%, and REM sleep was 25.3%.  There were 3 Stage R periods observed on this study night, 13 awakenings (i.e. transitions to Stage W from any sleep stage), and 44 total stage transitions. Wake after sleep onset (WASO) time accounted for 68 m.  BODY POSITION:  TST was divided  between the respective sleep position was as follows: supine sleep 20 minutes (7%), non-supine 286 minutes (93%); right 82 minutes (27%), left 203 minutes (67%), and prone 00 minutes (0%).  Total supine REM sleep time was 00 minutes (0% of total REM sleep).  RESPIRATORY MONITORING:   Based on CMS criteria (using a 4% oxygen desaturation rule for scoring hypopneas), there were 3 apneas (3 obstructive; 0 central; 0 mixed), and 0 hypopneas.  Apnea index was 0.6/h. Hypopnea index was 0.0/h. The total AHI (apnea-hypopnea index) was 0.6/h . AHI was 3.0/h supine, 2/h non-supine; 1.5/h in REM, 0.3/h NREM . There were 0 respiratory effort-related arousals (RERAs).   OXIMETRY: Oxyhemoglobin Saturation Nadir during sleep was 86%  from a mean of 95%.  Of the Total sleep time (TST)   hypoxemia (<89%) was present for  0.1 minutes, or 0.0% of total sleep time.  LIMB MOVEMENTS: There were 0 periodic limb movements of sleep (0.0/hr) AROUSAL: There were 28 arousals in total, for an arousal index of 5 /hour.  Of these, 2 were identified as respiratory-related arousals (0 /h), 0 were PLM-related arousals (0 /  h), and 33 were non-specific arousals (6 /h). EEG:  PSG EEG was of normal amplitude and frequency, with symmetric manifestation of sleep stages. EKG: The electrocardiogram documented  NSR and few PVCs.  The average heart rate during sleep was 63 bpm.  The heart rate during sleep varied between 48 and 86 bpm. AUDIO and VIDEO: no sleep related complex movement or vocalization.   IMPRESSION: 1) Sleep disordered breathing was not present. Obstructive Sleep Apnea didn't reach a clinically significant degree ,there was no hypoxia. 2) REM  sleep was present at 25% of total sleep time. 3) Intermittent sinus bradycardia, not to a critical level.    4) Total sleep time was reduced at 306.0 minutes.  Sleep efficiency was decreased at 67.5%.  Sleep fragmentation was noted- The majority of sleep arousals was related to spontaneous arousals and unrelated to EKG, EEG, EMG  physiological changes. These clustered between 2 AM and 3.50 AM    RECOMMENDATIONS: There was no significant sleep disordered breathing, no cardiac dysrhythmia, and only dew  isolated  non- periodic Limb Movements seen. I can not find a physiological cause for the arousals leading up to the 2.30-3.30 sleep interruptions, aside from 3 times of nocturia. i would suggest a tricyclic medication to help with tension type headaches and  insomnia as well as reduction in nocturia. Consider imipramine.     Melvyn Novas,  MD

## 2022-05-26 ENCOUNTER — Telehealth: Payer: Self-pay | Admitting: *Deleted

## 2022-05-26 NOTE — Telephone Encounter (Signed)
Called pt. Relayed results per Dr. Edwena Felty note. Pt verbalized understanding. She would like to hold off on starting any new meds right now. She will continue to monitor sx and call back if she has any new/worsening sx moving forward.

## 2022-05-26 NOTE — Telephone Encounter (Signed)
-----   Message from Larey Seat, MD sent at 05/24/2022  6:19 PM EDT ----- IMPRESSION:  1) Sleep disordered breathing was not present. Obstructive Sleep Apnea didn't reach a clinically significant degree ,there was no hypoxia. 2) REM sleep was present at 25% of total sleep time, a normal proportion.  3)Intermittent sinus bradycardia was seen, not to a critical level.  4)Total sleep time was reducedat 306.89minutes. Sleep efficiency was decreasedat 67.5%.Sleep fragmentation was noted- The majority of sleep arousals was related to spontaneous arousals andunrelated to EKG, EEG, EMG physiological changes. These arousals clustered between 2 AM and 3.50 AM   RECOMMENDATIONS: There was no significant sleep disordered breathing, no cardiac dysrhythmia, and only dew isolated non- periodic Limb Movements seen. I can not find a physiological cause for the arousals leading up to the 2.30-3.30 sleep interruptions, aside from 3 times ofnocturia. i would suggest a tricyclic medication to help with tension type headaches and insomnia as well as reduction in nocturia. Consider imipramine.    CarmenDohmeier, MD

## 2022-06-04 ENCOUNTER — Telehealth: Payer: Self-pay | Admitting: Interventional Cardiology

## 2022-06-04 NOTE — Telephone Encounter (Signed)
Notified patient that provider said it was OK to be seen PRN, to follow-up with PCP.  Appt for 10/5 cancelled. Informed patient to call us if anything changes.  Patient verbalized understanding and expressed appreciation. She states she is going to hold off on starting metoprolol for right now.

## 2022-06-04 NOTE — Telephone Encounter (Signed)
Patient called stating she feels she does not need to keep her appointment on 10/5 and would like a call back if she still needs to be seen.

## 2022-06-04 NOTE — Telephone Encounter (Signed)
Returned call to patient.  She reports she still has some lightheadedness and "bubbling" sensation in the chest, but is not too concerned about this. She states she will keep her appointment with Robbie Lis, PA-C on 06/10/22 if it is advised.  Discussed results of heart monitor.  [place], [place]  06/04/2022 12:25 PM EDT     Please sinus rhythm.  Few short runs of supraventricular tachycardia.  Less than 1% PAC burden and 1.3% burden of PVC.  If she is feeling palpitation, add metoprolol 12.5mg  BID.     Patient states she will look up metoprolol and let us know if that is something she would like to start taking.  Will forward note to [facility], PA-C to review and advise on follow-up appointment.

## 2022-06-07 ENCOUNTER — Other Ambulatory Visit: Payer: Self-pay | Admitting: Neurology

## 2022-06-09 ENCOUNTER — Telehealth: Payer: Self-pay | Admitting: Interventional Cardiology

## 2022-06-09 NOTE — Telephone Encounter (Signed)
Contacted the patient and gave lab results. She voiced understanding.

## 2022-06-09 NOTE — Telephone Encounter (Signed)
Pt is returning call in regards to results. Requesting return call.  

## 2022-06-10 ENCOUNTER — Ambulatory Visit: Payer: Medicare Other | Admitting: Physician Assistant

## 2022-06-15 DIAGNOSIS — Z01419 Encounter for gynecological examination (general) (routine) without abnormal findings: Secondary | ICD-10-CM | POA: Diagnosis not present

## 2022-06-15 DIAGNOSIS — Z1231 Encounter for screening mammogram for malignant neoplasm of breast: Secondary | ICD-10-CM | POA: Diagnosis not present

## 2022-06-15 DIAGNOSIS — Z6824 Body mass index (BMI) 24.0-24.9, adult: Secondary | ICD-10-CM | POA: Diagnosis not present

## 2022-06-17 ENCOUNTER — Other Ambulatory Visit: Payer: Self-pay | Admitting: Obstetrics & Gynecology

## 2022-06-17 DIAGNOSIS — R928 Other abnormal and inconclusive findings on diagnostic imaging of breast: Secondary | ICD-10-CM

## 2022-06-25 ENCOUNTER — Ambulatory Visit
Admission: RE | Admit: 2022-06-25 | Discharge: 2022-06-25 | Disposition: A | Discharge status: Home / Self Care | Payer: Medicare Other | Source: Ambulatory Visit | Attending: Obstetrics & Gynecology | Admitting: Obstetrics & Gynecology

## 2022-06-25 DIAGNOSIS — R928 Other abnormal and inconclusive findings on diagnostic imaging of breast: Secondary | ICD-10-CM

## 2022-06-25 DIAGNOSIS — R92331 Mammographic heterogeneous density, right breast: Secondary | ICD-10-CM | POA: Diagnosis not present

## 2022-06-25 DIAGNOSIS — N6489 Other specified disorders of breast: Secondary | ICD-10-CM | POA: Diagnosis not present

## 2022-07-07 ENCOUNTER — Ambulatory Visit: Payer: Medicare Other | Admitting: Family Medicine

## 2022-07-07 VITALS — BP 132/79 | Ht 67.0 in | Wt 157.0 lb

## 2022-07-07 DIAGNOSIS — S29012A Strain of muscle and tendon of back wall of thorax, initial encounter: Secondary | ICD-10-CM | POA: Diagnosis not present

## 2022-07-07 MED ORDER — BACLOFEN 10 MG PO TABS: 10.0000 mg | ORAL_TABLET | Freq: Three times a day (TID) | ORAL | 0 refills | Status: DC | PRN

## 2022-07-07 NOTE — Patient Instructions (Addendum)
You have a thoracic strain. Start physical therapy. Do home exercises and stretches on days you don't go to therapy. Baclofen up to three times a day as needed for spasms (you can split these in half if you want). Can consider an IM injection of toradol if you're struggling. Oral anti-inflammatory is a consideration too but you've not tolerated a couple of these. Follow up with me in 4 weeks if you're improving, call me sooner if you're struggling.

## 2022-07-08 ENCOUNTER — Encounter: Payer: Self-pay | Admitting: Family Medicine

## 2022-07-08 NOTE — Progress Notes (Signed)
PCP: Laurann Montana, MD  Subjective:   HPI: Patient is a 71 y.o. female here for back pain.  Patient reports she's had about 6-7 days of left-sided thoracic back pain. She was more active prior to this starting - carrying things at a [employer]. No acute injury prior to this though. She has been doing stretches and exercises at home without much improvement. Tried robaxin only but no benefit with this. Reluctant to try a lot of medicines with her allergies and severe migraines. No radiation of pain. No numbness/tingling.  Past Medical History:  Diagnosis Date   Anxiety    Depression    Hypothyroidism    Mild CAD    cath 03/2022: 20% proximal RCA stenosis   Thyroid disease     Current Outpatient Medications on File Prior to Visit  Medication Sig Dispense Refill   ALPRAZolam (XANAX) 0.5 MG tablet Take 1 tablet (0.5 mg total) by mouth once as needed for up to 1 dose for anxiety (Take 1 tablet at bedtime in sleep lab, may use additional tablet if needed.). 2 tablet 0   diphenhydrAMINE (BENADRYL) 25 MG tablet Take 25 mg by mouth every 6 (six) hours as needed.     Galcanezumab-gnlm (EMGALITY) 120 MG/ML SOAJ Inject 120 mg into the skin every 30 (thirty) days. 1.12 mL 11   halobetasol (ULTRAVATE) 0.05 % cream Apply topically 2 (two) times daily.     Levothyroxine Sodium 137 MCG CAPS   1   ondansetron (ZOFRAN) 8 MG tablet 1 tablets     Ubrogepant (UBRELVY) 100 MG TABS Take 100 mg by mouth as needed (take 1 at onset of headache, max is 200 mg in 24 hours). 10 tablet 11   No current facility-administered medications on file prior to visit.    Past Surgical History:  Procedure Laterality Date   BACK SURGERY     BUNIONECTOMY Right    CESAREAN SECTION     LEFT HEART CATH AND CORONARY ANGIOGRAPHY N/A 03/15/2022   Procedure: LEFT HEART CATH AND CORONARY ANGIOGRAPHY;  Surgeon: Kathleene Hazel, MD;  Location: [facility];  Service: Cardiovascular;  Laterality: N/A;     Allergies  Allergen Reactions   Flagyl [Metronidazole] Anaphylaxis   Hydrocodone-Acetaminophen     Other reaction(s): Confusion, Confusion (intolerance)   Tramadol Nausea Only and Nausea And Vomiting   Alendronate     Other reaction(s): Other (See Comments)   Amoxicillin     rash   Azithromycin     Other reaction(s): Other (See Comments)   Buspar [Buspirone]     Heart palpations   Buspirone Hcl     Other reaction(s): Irregular Heart Rate   Celebrex [Celecoxib]     Chest pain   Cymbalta [Duloxetine Hcl]     depression   Duloxetine     Other reaction(s): Other (See Comments)   Estradiol     Other reaction(s): Other (See Comments)   Gabapentin    Hydroxychloroquine     Other reaction(s): Other (See Comments)   Meloxicam Nausea Only   Tizanidine Other (See Comments)    spinning   Topiramate Other (See Comments)    "Spinning" when taken with tizanidine    BP 132/79   Ht 5\' 7"  (1.702 m)   Wt 157 lb (71.2 kg)   BMI 24.59 kg/m       No data to display              No data to display  Objective:  Physical Exam:  Gen: NAD, comfortable in exam room  Back: No gross deformity, scoliosis. TTP left paraspinal thoracic region but mildly.  No midline or bony TTP. FROM with trunk rotation, flexion, extension. Strength LEs 5/5 all muscle groups.   Negative SLRs. Sensation intact to light touch bilaterally.   Assessment & Plan:  1. Thoracic strain - more on left side.  No red flags and no bony tenderness.  Start physical therapy, home exercises.  Will try baclofen as needed.  Consider IM toradol if struggling.  F/u in 4 weeks.

## 2022-07-16 ENCOUNTER — Other Ambulatory Visit: Payer: Self-pay

## 2022-07-16 ENCOUNTER — Ambulatory Visit: Payer: Medicare Other | Attending: Family Medicine | Admitting: Rehabilitative and Restorative Service Providers"

## 2022-07-16 ENCOUNTER — Ambulatory Visit (INDEPENDENT_AMBULATORY_CARE_PROVIDER_SITE_OTHER): Payer: Medicare Other | Admitting: Sports Medicine

## 2022-07-16 ENCOUNTER — Encounter: Payer: Self-pay | Admitting: Neurology

## 2022-07-16 ENCOUNTER — Encounter: Payer: Self-pay | Admitting: Rehabilitative and Restorative Service Providers"

## 2022-07-16 DIAGNOSIS — S29012A Strain of muscle and tendon of back wall of thorax, initial encounter: Secondary | ICD-10-CM

## 2022-07-16 DIAGNOSIS — X58XXXA Exposure to other specified factors, initial encounter: Secondary | ICD-10-CM | POA: Diagnosis not present

## 2022-07-16 DIAGNOSIS — R252 Cramp and spasm: Secondary | ICD-10-CM

## 2022-07-16 DIAGNOSIS — M546 Pain in thoracic spine: Secondary | ICD-10-CM

## 2022-07-16 DIAGNOSIS — M6281 Muscle weakness (generalized): Secondary | ICD-10-CM

## 2022-07-16 MED ORDER — KETOROLAC TROMETHAMINE 60 MG/2ML IM SOLN
60.0000 mg | Freq: Once | INTRAMUSCULAR | Status: AC
  Administered 2022-07-16: 60 mg via INTRAMUSCULAR

## 2022-07-16 NOTE — Progress Notes (Unsigned)
Per Dr Pearletha Forge, ok to give pt toradol 60mg  IM. Pt tolerated procedure well.

## 2022-07-16 NOTE — Therapy (Signed)
OUTPATIENT PHYSICAL THERAPY THORACOLUMBAR EVALUATION   Patient Name: [name] MRN: [id number] DOB:[birth year], 71 y.o., female Today's Date: 07/16/2022   PT End of Session - 07/16/22 1039     Visit Number 1    Date for PT Re-Evaluation 09/10/22    Authorization Type BC/BS Medicare    Progress Note Due on Visit 10    PT Start Time 1030    PT Stop Time 1100    PT Time Calculation (min) 30 min    Activity Tolerance Patient tolerated treatment well    Behavior During Therapy [facility] for tasks assessed/performed             Past Medical History:  Diagnosis Date   Anxiety    Depression    Hypothyroidism    Mild CAD    cath 03/2022: 20% proximal RCA stenosis   Thyroid disease    Past Surgical History:  Procedure Laterality Date   BACK SURGERY     BUNIONECTOMY Right    CESAREAN SECTION     LEFT HEART CATH AND CORONARY ANGIOGRAPHY N/A 03/15/2022   Procedure: LEFT HEART CATH AND CORONARY ANGIOGRAPHY;  Surgeon: Burnell Blanks, MD;  Location: [place] [facility];  Service: Cardiovascular;  Laterality: N/A;   Patient Active Problem List   Diagnosis Date Noted   Chronic migraine with aura 04/07/2022   Sleep related headaches 04/07/2022   Sleep related bruxism 04/07/2022   Insomnia due to medical condition 04/07/2022   Snoring 04/07/2022   Nocturia 04/07/2022   NSTEMI (non-ST elevated myocardial infarction) ([place]) 03/15/2022   Chest pain 03/15/2022   Elevated troponin    Vertigo 12/23/2021   New onset headache 12/23/2021   Left foot pain 09/20/2017   Plantar fasciitis of left foot 04/20/2017   Elevated blood pressure 08/15/2013   Back pain 08/14/2013   Hypothyroidism 01/31/2008   DEPRESSION 01/31/2008   ASTHMA 01/31/2008   ARTHRITIS 01/31/2008   GASTRITIS 12/21/2007   DIVERTICULOSIS, COLON 10/25/2003    PCP: Samuel Bouche, MD  REFERRING PROVIDER: Dene Gentry, MD  REFERRING DIAG: S29.012A (ICD-10-CM) - Strain of thoracic back region  Rationale for  Evaluation and Treatment: Rehabilitation  THERAPY DIAG:  Pain in thoracic spine - Plan: PT plan of care cert/re-cert  Cramp and spasm - Plan: PT plan of care cert/re-cert  Muscle weakness (generalized) - Plan: PT plan of care cert/re-cert  ONSET DATE: 0000000  SUBJECTIVE:  SUBJECTIVE STATEMENT: Pt reports that she has had lumbar disectomy many years ago and has had intermittent thoracic pain that worsened when she went to a [employer] on 06/26/22 and had to carry a cooler for a long distance.   PERTINENT HISTORY:  Anxiety, Depression, Hypothyroidism, mild CAD, Hx of back surgery, migraine, vertigo  PAIN:  Are you having pain? Yes: NPRS scale: 8-9/10 Pain location: thoracic pain Pain description: poking Aggravating factors: unknown Relieving factors: unknown  PRECAUTIONS: None  WEIGHT BEARING RESTRICTIONS: No  FALLS:  Has patient fallen in last 6 months? No  LIVING ENVIRONMENT: Lives with: lives alone Lives in: House/apartment Stairs: Yes: Internal: 14 steps; on left going up Has following equipment at home: None  OCCUPATION: retired  PLOF: Independent and Leisure: outdoors, gardening, former Psychologist, occupational  PATIENT GOALS: Play with grandchildren with less pain.  NEXT MD VISIT: 08/04/2022 with Dr Andree Coss  OBJECTIVE:   DIAGNOSTIC FINDINGS:  Pt reports that she has had some radiographs with [facility]  PATIENT SURVEYS:  FOTO 34% (projected 52% by visit 12)  SCREENING FOR RED FLAGS: Bowel or bladder incontinence: No Spinal tumors: No Cauda equina syndrome: No Compression fracture: No Abdominal aneurysm: No  COGNITION: Overall cognitive status: Within functional limits for tasks assessed     SENSATION: WFL  MUSCLE LENGTH: Hamstrings: mild tightness noted  POSTURE:  rounded shoulders and forward head  PALPATION: Tender to palpation along left intercostals, left thoracic multifidi spasm noted  LUMBAR ROM:   Limited with pain  LOWER EXTREMITY ROM:     WFL  LOWER EXTREMITY MMT:    Bilateral hip strength of grossly 4/5, bilateral shoulder abduction 4+/5  LUMBAR SPECIAL TESTS:  Slump test: Negative  FUNCTIONAL TESTS:  5 times sit to stand: 12.2 sec without UE use (denies pain)  GAIT: Distance walked: >100 ft Assistive device utilized: None Level of assistance: Complete Independence Comments: Antalgic gait, has started using walking poles more  TODAY'S TREATMENT:                                                                                                                              DATE: 07/16/2022  Reviewed role of therapy and possibility of using dry needling   PATIENT EDUCATION:  Education details: Education on dry needling Person educated: Patient Education method: Explanation Education comprehension: verbalized understanding  HOME EXERCISE PROGRAM: Pt to bring in the HEP that she has been doing at home next visit  ASSESSMENT:  CLINICAL IMPRESSION: Patient is a 71 y.o. female who was seen today for physical therapy evaluation and treatment for strain of thoracic back region. Pts PLOF was independent and able to play her grandchildren.  Pt has a grandchild due later this month and she is wanting to be able to play with her grandchildren again without increased pain.  Pt presents with some thoracic muscle spasm noted, increased thoracic pain, and some hip weakness noted.  Pt reports a history of migraines with dizziness  and light sensitivity.  States that she has done vestibular PT in the past.  Pt would benefit from skilled PT to address her functional impairments.  OBJECTIVE IMPAIRMENTS: dizziness, increased muscle spasms, impaired flexibility, postural dysfunction, and pain.   ACTIVITY LIMITATIONS: carrying, lifting, bending,  and standing  PARTICIPATION LIMITATIONS: community activity and yard work  PERSONAL FACTORS: 3+ comorbidities: migraines, dizziness, Hx of back surgery  are also affecting patient's functional outcome.   REHAB POTENTIAL: Good  CLINICAL DECISION MAKING: Stable/uncomplicated  EVALUATION COMPLEXITY: Low   GOALS: Goals reviewed with patient? Yes  SHORT TERM GOALS: Target date: 08/13/2022  Pt will be independent with initial HEP. Baseline: Goal status: INITIAL  2.  Pt will report being able to sit on the floor with her grandchildren without increased pain. Baseline:  Goal status: INITIAL   LONG TERM GOALS: Target date: 09/10/2022  Pt will be independent with advanced HEP. Baseline:  Goal status: INITIAL  2.  Pt will increase FOTO to at least 52% to demonstrate improvement in functional activities. Baseline:  Goal status: INITIAL  3.  Pt will increase bilateral hip strength to at least 4+ to 5-/5 to allow her to perform floor transfers with increased ease. Baseline:  Goal status: INITIAL  4.  Pt will report ability to return to walking program without increased pain. Baseline:  Goal status: INITIAL   PLAN:  PT FREQUENCY: 2x/week  PT DURATION: 8 weeks  PLANNED INTERVENTIONS: Therapeutic exercises, Therapeutic activity, Neuromuscular re-education, Balance training, Gait training, Patient/Family education, Self Care, Joint mobilization, Joint manipulation, Stair training, Vestibular training, Canalith repositioning, Aquatic Therapy, Dry Needling, Electrical stimulation, Spinal manipulation, Spinal mobilization, Cryotherapy, Moist heat, Taping, Traction, Ultrasound, Ionotophoresis 4mg /ml Dexamethasone, Manual therapy, and Re-evaluation.  PLAN FOR NEXT SESSION: Provide pt with HEP, strengthening, manual therapy/dry needling as indicated   , PT 07/16/2022, 11:39 AM   [facility] [address], [address] [place], [place]  [place] Phone # [phone] Fax [phone]

## 2022-07-20 ENCOUNTER — Encounter: Payer: Self-pay | Admitting: Rehabilitative and Restorative Service Providers"

## 2022-07-20 ENCOUNTER — Ambulatory Visit: Payer: Medicare Other | Admitting: Rehabilitative and Restorative Service Providers"

## 2022-07-20 DIAGNOSIS — M6281 Muscle weakness (generalized): Secondary | ICD-10-CM | POA: Diagnosis not present

## 2022-07-20 DIAGNOSIS — M546 Pain in thoracic spine: Secondary | ICD-10-CM

## 2022-07-20 DIAGNOSIS — R252 Cramp and spasm: Secondary | ICD-10-CM

## 2022-07-20 DIAGNOSIS — S29012A Strain of muscle and tendon of back wall of thorax, initial encounter: Secondary | ICD-10-CM | POA: Diagnosis not present

## 2022-07-20 MED ORDER — EMGALITY 120 MG/ML ~~LOC~~ SOAJ: 120.0000 mg | SUBCUTANEOUS | 11 refills | Status: DC

## 2022-07-20 MED ORDER — EMGALITY 120 MG/ML ~~LOC~~ SOAJ: SUBCUTANEOUS | 1 refills | Status: DC

## 2022-07-20 NOTE — Addendum Note (Signed)
Addended by: Ann Maki on: 07/20/2022 11:31 AM   Modules accepted: Orders

## 2022-07-20 NOTE — Patient Instructions (Signed)

## 2022-07-20 NOTE — Therapy (Signed)
OUTPATIENT PHYSICAL THERAPY TREATMENT NOTE   Patient Name: [name] MRN: [id number] DOB:11-Jun-1951, 71 y.o., female Today's Date: 07/20/2022   PT End of Session - 07/20/22 1436     Visit Number 2    Date for PT Re-Evaluation 09/10/22    Authorization Type BC/BS Medicare    Progress Note Due on Visit 10    PT Start Time 1430    PT Stop Time 1510    PT Time Calculation (min) 40 min    Activity Tolerance Patient tolerated treatment well    Behavior [facility] for tasks assessed/performed             Past Medical History:  Diagnosis Date   Anxiety    Depression    Hypothyroidism    Mild CAD    cath 03/2022: 20% proximal RCA stenosis   Thyroid disease    Past Surgical History:  Procedure Laterality Date   BACK SURGERY     BUNIONECTOMY Right    CESAREAN SECTION     LEFT HEART CATH AND CORONARY ANGIOGRAPHY N/A 03/15/2022   Procedure: LEFT HEART CATH AND CORONARY ANGIOGRAPHY;  Surgeon: Kathleene HazelMcAlhany, Christopher D, MD;  Location: [facility];  Service: Cardiovascular;  Laterality: N/A;   Patient Active Problem List   Diagnosis Date Noted   Chronic migraine with aura 04/07/2022   Sleep related headaches 04/07/2022   Sleep related bruxism 04/07/2022   Insomnia due to medical condition 04/07/2022   Snoring 04/07/2022   Nocturia 04/07/2022   NSTEMI (non-ST elevated myocardial infarction) (HCC) 03/15/2022   Chest pain 03/15/2022   Elevated troponin    Vertigo 12/23/2021   New onset headache 12/23/2021   Left foot pain 09/20/2017   Plantar fasciitis of left foot 04/20/2017   Elevated blood pressure 08/15/2013   Back pain 08/14/2013   Hypothyroidism 01/31/2008   DEPRESSION 01/31/2008   ASTHMA 01/31/2008   ARTHRITIS 01/31/2008   GASTRITIS 12/21/2007   DIVERTICULOSIS, COLON 10/25/2003    PCP: Delfina RedwoodWhite, Cyntha, MD  REFERRING PROVIDER: Lenda KelpHudnall, Shane R, MD  REFERRING DIAG: S29.012A (ICD-10-CM) - Strain of thoracic back region  Rationale for  Evaluation and Treatment: Rehabilitation  THERAPY DIAG:  Pain in thoracic spine  Cramp and spasm  Muscle weakness (generalized)  ONSET DATE: 06/26/2022  SUBJECTIVE:                                                                                                                                                                                           SUBJECTIVE STATEMENT: Pt states that she had an injection on Friday after her PT evaluation.  States that she is  feeling some better today.  PERTINENT HISTORY:  Anxiety, Depression, Hypothyroidism, mild CAD, Hx of back surgery, migraine, vertigo  PAIN:  Are you having pain? Yes: NPRS scale: 7-8/10 Pain location: thoracic pain Pain description: poking Aggravating factors: unknown Relieving factors: unknown  PRECAUTIONS: None  WEIGHT BEARING RESTRICTIONS: No  FALLS:  Has patient fallen in last 6 months? No  LIVING ENVIRONMENT: Lives with: lives alone Lives in: House/apartment Stairs: Yes: Internal: 14 steps; on left going up Has following equipment at home: None  OCCUPATION: retired  PLOF: Independent and Leisure: outdoors, gardening, former Psychologist, occupational  PATIENT GOALS: Play with grandchildren with less pain.  NEXT MD VISIT: 08/04/2022 with Dr Andree Coss  OBJECTIVE:   DIAGNOSTIC FINDINGS:  Pt reports that she has had some radiographs with [facility]  PATIENT SURVEYS:  FOTO 34% (projected 52% by visit 12)  SCREENING FOR RED FLAGS: Bowel or bladder incontinence: No Spinal tumors: No Cauda equina syndrome: No Compression fracture: No Abdominal aneurysm: No  COGNITION: Overall cognitive status: Within functional limits for tasks assessed     SENSATION: WFL  MUSCLE LENGTH: Hamstrings: mild tightness noted  POSTURE: rounded shoulders and forward head  PALPATION: Tender to palpation along left intercostals, left thoracic multifidi spasm noted  LUMBAR ROM:   Limited with pain  LOWER EXTREMITY ROM:      WFL  LOWER EXTREMITY MMT:    Bilateral hip strength of grossly 4/5, bilateral shoulder abduction 4+/5  LUMBAR SPECIAL TESTS:  Slump test: Negative  FUNCTIONAL TESTS:  5 times sit to stand: 12.2 sec without UE use (denies pain)  GAIT: Distance walked: >100 ft Assistive device utilized: None Level of assistance: Complete Independence Comments: Antalgic gait, has started using walking poles more  TODAY'S TREATMENT:                                                                                                                              DATE: 07/20/2022 Nustep level 3 x6 min with PT present to discuss status Open book x10 bilat Prone hip extension x10 bilat Review of previous HEP and incorporation of new exercises and alterations to exercises to decrease strain on back Education to avoid having heat pack on back for more than 20 minutes secondary to noted reddened area on left side of thoracic spine secondary to previous heat injury. Trigger Point Dry-Needling  Treatment instructions: Expect mild to moderate muscle soreness. S/S of pneumothorax if dry needled over a lung field, and to seek immediate medical attention should they occur. Patient verbalized understanding of these instructions and education. Patient Consent Given: Yes Education handout provided: Yes Muscles treated: left sided thoracic multifidi Electrical stimulation performed: No Parameters: N/A Treatment response/outcome: Utilized skilled palpation to locate trigger points.  Able to illicit twitch response and palpable muscle elongation. Manual Therapy:  soft tissue mobilization to thoracic and lumbar spine follow dry needling.    DATE: 07/16/2022  Reviewed role of therapy and possibility of using dry needling  PATIENT EDUCATION:  Education details: Issued HEP Person educated: Patient Education method: Explanation, Facilities manager, and Handouts Education comprehension: verbalized understanding and returned  demonstration  HOME EXERCISE PROGRAM: Access Code: [email or web address] URL: [email or web address] Date: 07/20/2022 Prepared by: Clydie Braun Dreyson Mishkin  Exercises - Seated Cervical Rotation AROM  - 1 x daily - 7 x weekly - 2 sets - 10 reps - Seated Isometric Cervical Sidebending  - 1 x daily - 7 x weekly - 1 sets - 10 reps - Seated Assisted Cervical Rotation with Towel  - 1 x daily - 7 x weekly - 2 sets - 10 reps - Cervical Extension AROM with Strap  - 1 x daily - 7 x weekly - 2 sets - 10 reps - Hip Flexor Stretch with Chair  - 1 x daily - 7 x weekly - 2 sets - 10 reps - Gastroc Stretch on Wall  - 1 x daily - 7 x weekly - 1 sets - 10 reps - Wall Angels  - 1 x daily - 7 x weekly - 2 sets - 10 reps - Standing Bicep Curls Supinated with Dumbbells  - 1 x daily - 7 x weekly - 2 sets - 10 reps - Shoulder Abduction with Dumbbells - Thumbs Up  - 1 x daily - 7 x weekly - 2 sets - 10 reps - Shoulder Overhead Press in Abduction with Dumbbells  - 1 x daily - 7 x weekly - 2 sets - 10 reps - Supine Posterior Pelvic Tilt  - 1 x daily - 7 x weekly - 2 sets - 10 reps - Supine March with Posterior Pelvic Tilt  - 1 x daily - 7 x weekly - 2 sets - 10 reps - Supine Hip Hike  - 1 x daily - 7 x weekly - 2 sets - 10 reps - Supine Chin Tuck  - 1 x daily - 7 x weekly - 2 sets - 10 reps - Supine Piriformis Stretch  - 1 x daily - 7 x weekly - 2 sets - 5 reps - 20 sec hold - Prone Hip Extension  - 1 x daily - 7 x weekly - 1 sets - 10 reps - Cat Cow  - 1 x daily - 7 x weekly - 2 sets - 10 reps - Sidelying Thoracic Rotation with Open Book  - 1 x daily - 7 x weekly - 1 sets - 10 reps  ASSESSMENT:  CLINICAL IMPRESSION: Ms Stangler presents to skilled PT reporting that she is feeling some better since having her injection by MD.  Pt brought in her HEP that she has been performing previously and this PT able to add to her HEP for this clinic and able to speak to pt about changes that may need to be made for decreased strain on  thoracic spine.  Pt verbalizes understanding.  Pt educated about the dangers of excessive heat, especially as reddened area visible during session where pt already has tissue injury from excessive heat.  Pt verbalizes her understanding.  Pt agreeable to dry needling and provided with education handout.  Attempted sidelying hip abduction, but pt was unable to perform without increased right sided hip pain, so did not add to HEP.  Pt continues to require skilled PT to progress towards goal related activities.  OBJECTIVE IMPAIRMENTS: dizziness, increased muscle spasms, impaired flexibility, postural dysfunction, and pain.   ACTIVITY LIMITATIONS: carrying, lifting, bending, and standing  PARTICIPATION LIMITATIONS: community activity and yard work  PERSONAL FACTORS: 3+ comorbidities: migraines, dizziness,  Hx of back surgery  are also affecting patient's functional outcome.   REHAB POTENTIAL: Good  CLINICAL DECISION MAKING: Stable/uncomplicated  EVALUATION COMPLEXITY: Low   GOALS: Goals reviewed with patient? Yes  SHORT TERM GOALS: Target date: 08/13/2022  Pt will be independent with initial HEP. Baseline: Goal status: IN PROGRESS  2.  Pt will report being able to sit on the floor with her grandchildren without increased pain. Baseline:  Goal status: INITIAL   LONG TERM GOALS: Target date: 09/10/2022  Pt will be independent with advanced HEP. Baseline:  Goal status: INITIAL  2.  Pt will increase FOTO to at least 52% to demonstrate improvement in functional activities. Baseline:  Goal status: INITIAL  3.  Pt will increase bilateral hip strength to at least 4+ to 5-/5 to allow her to perform floor transfers with increased ease. Baseline:  Goal status: INITIAL  4.  Pt will report ability to return to walking program without increased pain. Baseline:  Goal status: INITIAL   PLAN:  PT FREQUENCY: 2x/week  PT DURATION: 8 weeks  PLANNED INTERVENTIONS: Therapeutic exercises,  Therapeutic activity, Neuromuscular re-education, Balance training, Gait training, Patient/Family education, Self Care, Joint mobilization, Joint manipulation, Stair training, Vestibular training, Canalith repositioning, Aquatic Therapy, Dry Needling, Electrical stimulation, Spinal manipulation, Spinal mobilization, Cryotherapy, Moist heat, Taping, Traction, Ultrasound, Ionotophoresis 4mg /ml Dexamethasone, Manual therapy, and Re-evaluation.  PLAN FOR NEXT SESSION: Provide pt with HEP, strengthening, manual therapy/dry needling as indicated   , PT 07/20/2022, 3:34 PM   [facility] [address], [address] [place], [place] Phone # [phone] Fax [phone]

## 2022-07-22 ENCOUNTER — Ambulatory Visit: Payer: Medicare Other

## 2022-07-22 DIAGNOSIS — M546 Pain in thoracic spine: Secondary | ICD-10-CM | POA: Diagnosis not present

## 2022-07-22 DIAGNOSIS — S29012A Strain of muscle and tendon of back wall of thorax, initial encounter: Secondary | ICD-10-CM | POA: Diagnosis not present

## 2022-07-22 DIAGNOSIS — R252 Cramp and spasm: Secondary | ICD-10-CM | POA: Diagnosis not present

## 2022-07-22 DIAGNOSIS — M6281 Muscle weakness (generalized): Secondary | ICD-10-CM | POA: Diagnosis not present

## 2022-07-22 NOTE — Therapy (Signed)
OUTPATIENT PHYSICAL THERAPY TREATMENT NOTE   Patient Name: [name] MRN: [id number] DOB:1951-02-07, 71 y.o., female Today's Date: 07/22/2022   [name] End of Session - 07/22/22 1222     Visit Number 3    Date for [name] Re-Evaluation 09/10/22    Authorization Type BC/BS Medicare    Progress Note Due on Visit 10    [name] Start Time 1215    [name] Stop Time 1300    [name] Time Calculation (min) 45 min    Activity Tolerance Patient tolerated treatment well    Behavior During Therapy [facility] for tasks assessed/performed             Past Medical History:  Diagnosis Date   Anxiety    Depression    Hypothyroidism    Mild CAD    cath 03/2022: 20% proximal RCA stenosis   Thyroid disease    Past Surgical History:  Procedure Laterality Date   BACK SURGERY     BUNIONECTOMY Right    CESAREAN SECTION     LEFT HEART CATH AND CORONARY ANGIOGRAPHY N/A 03/15/2022   Procedure: LEFT HEART CATH AND CORONARY ANGIOGRAPHY;  Surgeon: Kathleene Hazel, MD;  Location: [facility];  Service: Cardiovascular;  Laterality: N/A;   Patient Active Problem List   Diagnosis Date Noted   Chronic migraine with aura 04/07/2022   Sleep related headaches 04/07/2022   Sleep related bruxism 04/07/2022   Insomnia due to medical condition 04/07/2022   Snoring 04/07/2022   Nocturia 04/07/2022   NSTEMI (non-ST elevated myocardial infarction) (HCC) 03/15/2022   Chest pain 03/15/2022   Elevated troponin    Vertigo 12/23/2021   New onset headache 12/23/2021   Left foot pain 09/20/2017   Plantar fasciitis of left foot 04/20/2017   Elevated blood pressure 08/15/2013   Back pain 08/14/2013   Hypothyroidism 01/31/2008   DEPRESSION 01/31/2008   ASTHMA 01/31/2008   ARTHRITIS 01/31/2008   GASTRITIS 12/21/2007   DIVERTICULOSIS, COLON 10/25/2003    PCP: Delfina Redwood, MD  REFERRING PROVIDER: Lenda Kelp, MD  REFERRING DIAG: S29.012A (ICD-10-CM) - Strain of thoracic back region  Rationale for  Evaluation and Treatment: Rehabilitation  THERAPY DIAG:  Pain in thoracic spine  Cramp and spasm  Muscle weakness (generalized)  ONSET DATE: 06/26/2022  SUBJECTIVE:                                                                                                                                                                                           SUBJECTIVE STATEMENT: [name] states she did ok with the dry needling.  Felt some benefit.    PERTINENT HISTORY:  Anxiety, Depression, Hypothyroidism, mild CAD, Hx of back surgery, migraine, vertigo  PAIN:  Are you having pain? Yes: NPRS scale: 7-8/10 Pain location: thoracic pain Pain description: poking Aggravating factors: unknown Relieving factors: unknown  PRECAUTIONS: None  WEIGHT BEARING RESTRICTIONS: No  FALLS:  Has patient fallen in last 6 months? No  LIVING ENVIRONMENT: Lives with: lives alone Lives in: House/apartment Stairs: Yes: Internal: 14 steps; on left going up Has following equipment at home: None  OCCUPATION: retired  PLOF: Independent and Leisure: outdoors, gardening, former Psychologist, occupational  PATIENT GOALS: Play with grandchildren with less pain.  NEXT MD VISIT: 08/04/2022 with Dr Andree Coss  OBJECTIVE:   DIAGNOSTIC FINDINGS:  [name] reports that she has had some radiographs with [facility]  PATIENT SURVEYS:  FOTO 34% (projected 52% by visit 12)  SCREENING FOR RED FLAGS: Bowel or bladder incontinence: No Spinal tumors: No Cauda equina syndrome: No Compression fracture: No Abdominal aneurysm: No  COGNITION: Overall cognitive status: Within functional limits for tasks assessed     SENSATION: WFL  MUSCLE LENGTH: Hamstrings: mild tightness noted  POSTURE: rounded shoulders and forward head  PALPATION: Tender to palpation along left intercostals, left thoracic multifidi spasm noted  LUMBAR ROM:   Limited with pain  LOWER EXTREMITY ROM:     WFL  LOWER EXTREMITY MMT:    Bilateral hip strength of  grossly 4/5, bilateral shoulder abduction 4+/5  LUMBAR SPECIAL TESTS:  Slump test: Negative  FUNCTIONAL TESTS:  5 times sit to stand: 12.2 sec without UE use (denies pain)  GAIT: Distance walked: >100 ft Assistive device utilized: None Level of assistance: Complete Independence Comments: Antalgic gait, has started using walking poles more  TODAY'S TREATMENT:                                                                                                                              DATE: 07/22/2022 Nustep level 3 x 6 min with [name] present to discuss status Review of previous HEP and incorporation of new exercises and alterations to exercises to decrease strain on back Added right shoulder stability exercises and provided handouts for HEP: prone shoulder ext, row, horizontal abduction, SL ER and serratus punch with 0# x 10 each  DATE: 07/20/2022 Nustep level 3 x6 min with [name] present to discuss status Open book x10 bilat Prone hip extension x10 bilat Review of previous HEP and incorporation of new exercises and alterations to exercises to decrease strain on back Education to avoid having heat pack on back for more than 20 minutes secondary to noted reddened area on left side of thoracic spine secondary to previous heat injury. Trigger Point Dry-Needling  Treatment instructions: Expect mild to moderate muscle soreness. S/S of pneumothorax if dry needled over a lung field, and to seek immediate medical attention should they occur. Patient verbalized understanding of these instructions and education. Patient Consent Given: Yes Education handout provided: Yes Muscles treated: left sided thoracic multifidi Electrical stimulation performed: No Parameters:  N/A Treatment response/outcome: Utilized skilled palpation to locate trigger points.  Able to illicit twitch response and palpable muscle elongation. Manual Therapy:  soft tissue mobilization to thoracic and lumbar spine follow dry needling.     DATE: 07/16/2022  Reviewed role of therapy and possibility of using dry needling   PATIENT EDUCATION:  Education details: Issued HEP Person educated: Patient Education method: Explanation, Demonstration, and Handouts Education comprehension: verbalized understanding and returned demonstration  HOME EXERCISE PROGRAM: Access Code: [email or web address] URL: [email or web address] Date: 07/22/2022 Prepared by: Mikey Kirschner  Exercises - Seated Cervical Rotation AROM  - 1 x daily - 7 x weekly - 2 sets - 10 reps - Seated Isometric Cervical Sidebending  - 1 x daily - 7 x weekly - 1 sets - 10 reps - Seated Assisted Cervical Rotation with Towel  - 1 x daily - 7 x weekly - 2 sets - 10 reps - Cervical Extension AROM with Strap  - 1 x daily - 7 x weekly - 2 sets - 10 reps - Hip Flexor Stretch with Chair  - 1 x daily - 7 x weekly - 2 sets - 10 reps - Gastroc Stretch on Wall  - 1 x daily - 7 x weekly - 1 sets - 10 reps - Wall Angels  - 1 x daily - 7 x weekly - 2 sets - 10 reps - Standing Bicep Curls Supinated with Dumbbells  - 1 x daily - 7 x weekly - 2 sets - 10 reps - Shoulder Abduction with Dumbbells - Thumbs Up  - 1 x daily - 7 x weekly - 2 sets - 10 reps - Shoulder Overhead Press in Abduction with Dumbbells  - 1 x daily - 7 x weekly - 2 sets - 10 reps - Supine Posterior Pelvic Tilt  - 1 x daily - 7 x weekly - 2 sets - 10 reps - Supine March with Posterior Pelvic Tilt  - 1 x daily - 7 x weekly - 2 sets - 10 reps - Supine Hip Hike  - 1 x daily - 7 x weekly - 2 sets - 10 reps - Supine Chin Tuck  - 1 x daily - 7 x weekly - 2 sets - 10 reps - Supine Piriformis Stretch  - 1 x daily - 7 x weekly - 2 sets - 5 reps - 20 sec hold - Prone Hip Extension  - 1 x daily - 7 x weekly - 1 sets - 10 reps - Cat Cow  - 1 x daily - 7 x weekly - 2 sets - 10 reps - Sidelying Thoracic Rotation with Open Book  - 1 x daily - 7 x weekly - 1 sets - 10 reps - Prone Shoulder Extension - Single Arm  - 2 x daily -  7 x weekly - 2 sets - 10 reps - Prone Shoulder Row  - 2 x daily - 7 x weekly - 2 sets - 10 reps - Prone Single Arm Shoulder Horizontal Abduction with Scapular Retraction and Palm Down  - 2 x daily - 7 x weekly - 2 sets - 10 reps - Sidelying Shoulder External Rotation  - 2 x daily - 7 x weekly - 2 sets - 10 reps - Single Arm Serratus Punches in Supine with Dumbbell  - 2 x daily - 7 x weekly - 2 sets - 10 repsAccess Code: [email or web address] URL: [email or web address] Date: 07/20/2022 Prepared by: Clydie Braun Menke    ASSESSMENT:  CLINICAL IMPRESSION: [name] had some  positive response to dry needling.  She had some confusion on several exercises and needed review today which took up a large part of our session.  Patient mentioned a right shoulder injury from a fall years ago that she never really addressed.  We added some right shoulder stabilization exercises to address this.   [name] continues to require skilled [name] to progress towards goal related activities.  OBJECTIVE IMPAIRMENTS: dizziness, increased muscle spasms, impaired flexibility, postural dysfunction, and pain.   ACTIVITY LIMITATIONS: carrying, lifting, bending, and standing  PARTICIPATION LIMITATIONS: community activity and yard work  PERSONAL FACTORS: 3+ comorbidities: migraines, dizziness, Hx of back surgery  are also affecting patient's functional outcome.   REHAB POTENTIAL: Good  CLINICAL DECISION MAKING: Stable/uncomplicated  EVALUATION COMPLEXITY: Low   GOALS: Goals reviewed with patient? Yes  SHORT TERM GOALS: Target date: 08/13/2022  [name] will be independent with initial HEP. Baseline: Goal status: IN PROGRESS  2.  [name] will report being able to sit on the floor with her grandchildren without increased pain. Baseline:  Goal status: INITIAL   LONG TERM GOALS: Target date: 09/10/2022  [name] will be independent with advanced HEP. Baseline:  Goal status: INITIAL  2.  [name] will increase FOTO to at least 52% to  demonstrate improvement in functional activities. Baseline:  Goal status: INITIAL  3.  [name] will increase bilateral hip strength to at least 4+ to 5-/5 to allow her to perform floor transfers with increased ease. Baseline:  Goal status: INITIAL  4.  [name] will report ability to return to walking program without increased pain. Baseline:  Goal status: INITIAL   PLAN:  [name] FREQUENCY: 2x/week  [name] DURATION: 8 weeks  PLANNED INTERVENTIONS: Therapeutic exercises, Therapeutic activity, Neuromuscular re-education, Balance training, Gait training, Patient/Family education, Self Care, Joint mobilization, Joint manipulation, Stair training, Vestibular training, Canalith repositioning, Aquatic Therapy, Dry Needling, Electrical stimulation, Spinal manipulation, Spinal mobilization, Cryotherapy, Moist heat, Taping, Traction, Ultrasound, Ionotophoresis 4mg /ml Dexamethasone, Manual therapy, and Re-evaluation.  PLAN FOR NEXT SESSION: Provide [name] with HEP, strengthening, manual therapy/dry needling as indicated   Marsena Taff B. Bray Vickerman, [name] 07/22/22 2:05 PM   [facility] [address], [address] [place], [place] [address] Phone # [phone] Fax [phone]

## 2022-07-27 ENCOUNTER — Ambulatory Visit: Payer: Medicare Other

## 2022-08-04 ENCOUNTER — Encounter: Payer: Self-pay | Admitting: Family Medicine

## 2022-08-04 ENCOUNTER — Ambulatory Visit: Payer: Medicare Other | Admitting: Family Medicine

## 2022-08-04 VITALS — BP 112/78 | Ht 67.5 in | Wt 165.0 lb

## 2022-08-04 DIAGNOSIS — S29012D Strain of muscle and tendon of back wall of thorax, subsequent encounter: Secondary | ICD-10-CM

## 2022-08-04 NOTE — Progress Notes (Addendum)
  SUBJECTIVE:   Chief Complaint: back pain  History of Presenting Illness:   [name] is a 71 year old female with a past medical history of L plantar fasciitis, discectomy L4-5, vertigo, and hypothyroidism who presents with back pain. She was evaluated at [facility] on 11-10 and diagnosed with an upper back strain. At that time, she was prescribed PT, baclofen, and instructed to return in four weeks for reevaluation.  She states that her back pain started a couple years ago. It is located at the level of her bra strap just left of midline. Describes it as an excruciating, sharp, stabbing sensation that worsens with activities such as prolonged driving and baby holding. Since her last visit, she has completed two full sessions of physical therapy including one dry needling session that seemed to help. Additionally, she has been performing home exercises, at least an hour per day. She has taken eight or nine baclofen tablets, which have not provided any relief. Other unhelpful interventions that she has tried include acetaminophen, ibuprofen, and heat. She also received a ketorolac injection that was very helpful but only lasted 2-3 days. Last week, she drove to [place] to visit her new grandchild and experienced significant pain. Today, she feels much better, though still reports some mild discomfort.   Pertinent Medical History:   Past Medical History:  Diagnosis Date   Anxiety    Depression    Hypothyroidism    Mild CAD    cath 03/2022: 20% proximal RCA stenosis   Thyroid disease       OBJECTIVE:   BP 112/78   Ht 5' 7.5" (1.715 m)   Wt 165 lb (74.8 kg)   BMI 25.46 kg/m   Physical Examination:  Back  Mild scoliosis, no erythema or swelling, no vertebral or paraspinal tenderness to palpation, mild paraspinal tightness at thoracic level, spine RoM full with flexion and lateral bending, extension slightly reduced, arm strength 5/5, pectoralis provocative testing  negative   ASSESSMENT/PLAN:   Patient's presentation is most consistent with back muscle strain, which has improved from prior examination on 11-10. Physical therapy and home exercise sessions seem to have been beneficial. Exacerbation of pain last week almost certainly due to abrupt increase in activity level.  > Continue physical therapy sessions and daily home exercises > Recommend regular dry needling during physical therapy sessions > Consider intramuscular ketorolac injection for any acute exacerbations    There are no diagnoses linked to this encounter.  No follow-ups on file.  Lajuana Ripple, MD  08/04/2022, 9:46 AM

## 2022-08-05 ENCOUNTER — Encounter: Payer: Self-pay | Admitting: Rehabilitative and Restorative Service Providers"

## 2022-08-05 ENCOUNTER — Ambulatory Visit: Payer: Medicare Other | Admitting: Rehabilitative and Restorative Service Providers"

## 2022-08-05 DIAGNOSIS — M546 Pain in thoracic spine: Secondary | ICD-10-CM | POA: Diagnosis not present

## 2022-08-05 DIAGNOSIS — M6281 Muscle weakness (generalized): Secondary | ICD-10-CM | POA: Diagnosis not present

## 2022-08-05 DIAGNOSIS — R252 Cramp and spasm: Secondary | ICD-10-CM | POA: Diagnosis not present

## 2022-08-05 DIAGNOSIS — S29012A Strain of muscle and tendon of back wall of thorax, initial encounter: Secondary | ICD-10-CM | POA: Diagnosis not present

## 2022-08-05 NOTE — Therapy (Signed)
OUTPATIENT PHYSICAL THERAPY TREATMENT NOTE   Patient Name: [name] MRN: [id number] DOB:12/05/1950, 71 y.o., female Today's Date: 08/05/2022   PT End of Session - 08/05/22 0849     Visit Number 4    Date for PT Re-Evaluation 09/10/22    Authorization Type BC/BS Medicare    Progress Note Due on Visit 10    PT Start Time 0845    PT Stop Time 0925    PT Time Calculation (min) 40 min    Activity Tolerance Patient tolerated treatment well    Behavior During Therapy [facility] for tasks assessed/performed             Past Medical History:  Diagnosis Date   Anxiety    Depression    Hypothyroidism    Mild CAD    cath 03/2022: 20% proximal RCA stenosis   Thyroid disease    Past Surgical History:  Procedure Laterality Date   BACK SURGERY     BUNIONECTOMY Right    CESAREAN SECTION     LEFT HEART CATH AND CORONARY ANGIOGRAPHY N/A 03/15/2022   Procedure: LEFT HEART CATH AND CORONARY ANGIOGRAPHY;  Surgeon: Burnell Blanks, MD;  Location: [place] [facility];  Service: Cardiovascular;  Laterality: N/A;   Patient Active Problem List   Diagnosis Date Noted   Chronic migraine with aura 04/07/2022   Sleep related headaches 04/07/2022   Sleep related bruxism 04/07/2022   Insomnia due to medical condition 04/07/2022   Snoring 04/07/2022   Nocturia 04/07/2022   NSTEMI (non-ST elevated myocardial infarction) ([place]) 03/15/2022   Chest pain 03/15/2022   Elevated troponin    Vertigo 12/23/2021   New onset headache 12/23/2021   Left foot pain 09/20/2017   Plantar fasciitis of left foot 04/20/2017   Elevated blood pressure 08/15/2013   Back pain 08/14/2013   Hypothyroidism 01/31/2008   DEPRESSION 01/31/2008   ASTHMA 01/31/2008   ARTHRITIS 01/31/2008   GASTRITIS 12/21/2007   DIVERTICULOSIS, COLON 10/25/2003    PCP: Samuel Bouche, MD  REFERRING PROVIDER: Dene Gentry, MD  REFERRING DIAG: S29.012A (ICD-10-CM) - Strain of thoracic back region  Rationale for  Evaluation and Treatment: Rehabilitation  THERAPY DIAG:  Pain in thoracic spine  Cramp and spasm  Muscle weakness (generalized)  ONSET DATE: 06/26/2022  SUBJECTIVE:                                                                                                                                                                                           SUBJECTIVE STATEMENT: Pt reports that she went to see Dr Felipe Drone for follow up and he recommended that she continue  PT and dry needling.   PERTINENT HISTORY:  Anxiety, Depression, Hypothyroidism, mild CAD, Hx of back surgery, migraine, vertigo  PAIN:  Are you having pain? Yes: NPRS scale: 3/10 Pain location: thoracic pain Pain description: poking Aggravating factors: unknown Relieving factors: unknown  PRECAUTIONS: None  WEIGHT BEARING RESTRICTIONS: No  FALLS:  Has patient fallen in last 6 months? No  LIVING ENVIRONMENT: Lives with: lives alone Lives in: House/apartment Stairs: Yes: Internal: 14 steps; on left going up Has following equipment at home: None  OCCUPATION: retired  PLOF: Independent and Leisure: outdoors, gardening, former Leisure centre manager  PATIENT GOALS: Play with grandchildren with less pain.  NEXT MD VISIT: 08/04/2022 with Dr Felipe Drone  OBJECTIVE:   DIAGNOSTIC FINDINGS:  Pt reports that she has had some radiographs with [facility]  PATIENT SURVEYS:  Eval:  FOTO 34% (projected 52% by visit 12)  SCREENING FOR RED FLAGS: Bowel or bladder incontinence: No Spinal tumors: No Cauda equina syndrome: No Compression fracture: No Abdominal aneurysm: No  COGNITION: Overall cognitive status: Within functional limits for tasks assessed     SENSATION: WFL  MUSCLE LENGTH: Hamstrings: mild tightness noted  POSTURE: rounded shoulders and forward head  PALPATION: Tender to palpation along left intercostals, left thoracic multifidi spasm noted  LUMBAR ROM:   Limited with pain  LOWER EXTREMITY ROM:      WFL  LOWER EXTREMITY MMT:    Bilateral hip strength of grossly 4/5, bilateral shoulder abduction 4+/5  LUMBAR SPECIAL TESTS:  Slump test: Negative  FUNCTIONAL TESTS:  Eval:   5 times sit to stand: 12.2 sec without UE use (denies pain)  GAIT: Distance walked: >100 ft Assistive device utilized: None Level of assistance: Complete Independence Comments: Antalgic gait, has started using walking poles more  TODAY'S TREATMENT:                                                                                                                               DATE: 08/05/2022 Nustep level x6 min with PT present to discuss status Seated shoulder ER and horizontal abduction with yellow tband 2x10 each Seated shoulder D2 with yellow tband x10 bilat (pt with cuing for technique and posture) Seated 3 way green pball rollout 5x10 sec each Trigger Point Dry-Needling  Treatment instructions: Expect mild to moderate muscle soreness. S/S of pneumothorax if dry needled over a lung field, and to seek immediate medical attention should they occur. Patient verbalized understanding of these instructions and education. Patient Consent Given: Yes Education handout provided: Yes Muscles treated: bilat thoracic multifidi and left rhomboid Electrical stimulation performed: No Parameters: N/A Treatment response/outcome: Utilized skilled palpation to locate trigger points.  Able to illicit twitch response and palpable muscle elongation. Manual Therapy:  soft tissue mobilization to thoracic and lumbar spine follow dry needling.   DATE: 07/22/2022 Nustep level 3 x 6 min with PT present to discuss status Review of previous HEP and incorporation of new exercises and alterations to exercises to  decrease strain on back Added right shoulder stability exercises and provided handouts for HEP: prone shoulder ext, row, horizontal abduction, SL ER and serratus punch with 0# x 10 each  DATE: 07/20/2022 Nustep level 3 x6  min with PT present to discuss status Open book x10 bilat Prone hip extension x10 bilat Review of previous HEP and incorporation of new exercises and alterations to exercises to decrease strain on back Education to avoid having heat pack on back for more than 20 minutes secondary to noted reddened area on left side of thoracic spine secondary to previous heat injury. Trigger Point Dry-Needling  Treatment instructions: Expect mild to moderate muscle soreness. S/S of pneumothorax if dry needled over a lung field, and to seek immediate medical attention should they occur. Patient verbalized understanding of these instructions and education. Patient Consent Given: Yes Education handout provided: Yes Muscles treated: left sided thoracic multifidi Electrical stimulation performed: No Parameters: N/A Treatment response/outcome: Utilized skilled palpation to locate trigger points.  Able to illicit twitch response and palpable muscle elongation. Manual Therapy:  soft tissue mobilization to thoracic and lumbar spine follow dry needling.     PATIENT EDUCATION:  Education details: Issued HEP Person educated: Patient Education method: Explanation, Media planner, and Handouts Education comprehension: verbalized understanding and returned demonstration  HOME EXERCISE PROGRAM: Access Code: [email or web address] URL: [email or web address] Date: 07/22/2022 Prepared by: Candyce Churn  Exercises - Seated Cervical Rotation AROM  - 1 x daily - 7 x weekly - 2 sets - 10 reps - Seated Isometric Cervical Sidebending  - 1 x daily - 7 x weekly - 1 sets - 10 reps - Seated Assisted Cervical Rotation with Towel  - 1 x daily - 7 x weekly - 2 sets - 10 reps - Cervical Extension AROM with Strap  - 1 x daily - 7 x weekly - 2 sets - 10 reps - Hip Flexor Stretch with Chair  - 1 x daily - 7 x weekly - 2 sets - 10 reps - Gastroc Stretch on Wall  - 1 x daily - 7 x weekly - 1 sets - 10 reps - Wall Angels  - 1 x daily  - 7 x weekly - 2 sets - 10 reps - Standing Bicep Curls Supinated with Dumbbells  - 1 x daily - 7 x weekly - 2 sets - 10 reps - Shoulder Abduction with Dumbbells - Thumbs Up  - 1 x daily - 7 x weekly - 2 sets - 10 reps - Shoulder Overhead Press in Abduction with Dumbbells  - 1 x daily - 7 x weekly - 2 sets - 10 reps - Supine Posterior Pelvic Tilt  - 1 x daily - 7 x weekly - 2 sets - 10 reps - Supine March with Posterior Pelvic Tilt  - 1 x daily - 7 x weekly - 2 sets - 10 reps - Supine Hip Hike  - 1 x daily - 7 x weekly - 2 sets - 10 reps - Supine Chin Tuck  - 1 x daily - 7 x weekly - 2 sets - 10 reps - Supine Piriformis Stretch  - 1 x daily - 7 x weekly - 2 sets - 5 reps - 20 sec hold - Prone Hip Extension  - 1 x daily - 7 x weekly - 1 sets - 10 reps - Cat Cow  - 1 x daily - 7 x weekly - 2 sets - 10 reps - Sidelying Thoracic Rotation with Open Book  - 1 x daily -  7 x weekly - 1 sets - 10 reps - Prone Shoulder Extension - Single Arm  - 2 x daily - 7 x weekly - 2 sets - 10 reps - Prone Shoulder Row  - 2 x daily - 7 x weekly - 2 sets - 10 reps - Prone Single Arm Shoulder Horizontal Abduction with Scapular Retraction and Palm Down  - 2 x daily - 7 x weekly - 2 sets - 10 reps - Sidelying Shoulder External Rotation  - 2 x daily - 7 x weekly - 2 sets - 10 reps - Single Arm Serratus Punches in Supine with Dumbbell  - 2 x daily - 7 x weekly - 2 sets - 10 reps   ASSESSMENT:  CLINICAL IMPRESSION: Ms Nessler presents to skilled PT after having a week break secondary to going to help her family with the arrival of a new grandson.  Pt states that Dr Felipe Drone advised that she continue PT and recommended that she continue with dry needling.  Pt with good response to dry needling, reporting that she felt looser following.  Pt with some skin breakdown noted on back from previous leaving her heating pad on her skin too long.  Pt states that she has been doing better with limiting the time using the heating pad.  Pt  continues to progress towards goal related activities.  OBJECTIVE IMPAIRMENTS: dizziness, increased muscle spasms, impaired flexibility, postural dysfunction, and pain.   ACTIVITY LIMITATIONS: carrying, lifting, bending, and standing  PARTICIPATION LIMITATIONS: community activity and yard work  PERSONAL FACTORS: 3+ comorbidities: migraines, dizziness, Hx of back surgery  are also affecting patient's functional outcome.   REHAB POTENTIAL: Good  CLINICAL DECISION MAKING: Stable/uncomplicated  EVALUATION COMPLEXITY: Low   GOALS: Goals reviewed with patient? Yes  SHORT TERM GOALS: Target date: 08/13/2022  Pt will be independent with initial HEP. Baseline: Goal status: MET  2.  Pt will report being able to sit on the floor with her grandchildren without increased pain. Baseline:  Goal status: IN PROGRESS   LONG TERM GOALS: Target date: 09/10/2022  Pt will be independent with advanced HEP. Baseline:  Goal status: INITIAL  2.  Pt will increase FOTO to at least 52% to demonstrate improvement in functional activities. Baseline:  Goal status: INITIAL  3.  Pt will increase bilateral hip strength to at least 4+ to 5-/5 to allow her to perform floor transfers with increased ease. Baseline:  Goal status: INITIAL  4.  Pt will report ability to return to walking program without increased pain. Baseline:  Goal status: INITIAL   PLAN:  PT FREQUENCY: 2x/week  PT DURATION: 8 weeks  PLANNED INTERVENTIONS: Therapeutic exercises, Therapeutic activity, Neuromuscular re-education, Balance training, Gait training, Patient/Family education, Self Care, Joint mobilization, Joint manipulation, Stair training, Vestibular training, Canalith repositioning, Aquatic Therapy, Dry Needling, Electrical stimulation, Spinal manipulation, Spinal mobilization, Cryotherapy, Moist heat, Taping, Traction, Ultrasound, Ionotophoresis 24m/ml Dexamethasone, Manual therapy, and Re-evaluation.  PLAN FOR NEXT  SESSION: Add shoulder ER and horizontal abduction (and other strengthening) to HEP if pt able to complete with less cuing during session, strengthening, manual therapy/dry needling as indicated   SJuel Burrow PT 08/05/22 9:57 AM   [address] [place] [id number] # [phone] [phone]

## 2022-08-06 DIAGNOSIS — H1032 Unspecified acute conjunctivitis, left eye: Secondary | ICD-10-CM | POA: Diagnosis not present

## 2022-08-06 DIAGNOSIS — R051 Acute cough: Secondary | ICD-10-CM | POA: Diagnosis not present

## 2022-08-12 ENCOUNTER — Ambulatory Visit: Payer: Medicare Other | Attending: Family Medicine | Admitting: Rehabilitative and Restorative Service Providers"

## 2022-08-12 ENCOUNTER — Encounter: Payer: Self-pay | Admitting: Rehabilitative and Restorative Service Providers"

## 2022-08-12 ENCOUNTER — Telehealth: Payer: Self-pay | Admitting: Neurology

## 2022-08-12 DIAGNOSIS — M546 Pain in thoracic spine: Secondary | ICD-10-CM

## 2022-08-12 DIAGNOSIS — M6281 Muscle weakness (generalized): Secondary | ICD-10-CM

## 2022-08-12 DIAGNOSIS — R252 Cramp and spasm: Secondary | ICD-10-CM

## 2022-08-12 MED ORDER — EMGALITY 120 MG/ML ~~LOC~~ SOAJ: 120.0000 mg | SUBCUTANEOUS | 11 refills | Status: DC

## 2022-08-12 NOTE — Therapy (Addendum)
OUTPATIENT PHYSICAL THERAPY TREATMENT NOTE AND LATE ENTRY DISCHARGE SUMMARY   Patient Name: [name] MRN: [id number] DOB:[birth year], 71 y.o., female Today's Date: 08/12/2022   PT End of Session - 08/12/22 1018     Visit Number 5    Date for PT Re-Evaluation 09/10/22    Authorization Type BC/BS Medicare    Progress Note Due on Visit 10    PT Start Time 1015    PT Stop Time 1055    PT Time Calculation (min) 40 min    Activity Tolerance Patient tolerated treatment well    Behavior During Therapy [facility] for tasks assessed/performed             Past Medical History:  Diagnosis Date   Anxiety    Depression    Hypothyroidism    Mild CAD    cath 03/2022: 20% proximal RCA stenosis   Thyroid disease    Past Surgical History:  Procedure Laterality Date   BACK SURGERY     BUNIONECTOMY Right    CESAREAN SECTION     LEFT HEART CATH AND CORONARY ANGIOGRAPHY N/A 03/15/2022   Procedure: LEFT HEART CATH AND CORONARY ANGIOGRAPHY;  Surgeon: Burnell Blanks, MD;  Location: [place] [facility];  Service: Cardiovascular;  Laterality: N/A;   Patient Active Problem List   Diagnosis Date Noted   Chronic migraine with aura 04/07/2022   Sleep related headaches 04/07/2022   Sleep related bruxism 04/07/2022   Insomnia due to medical condition 04/07/2022   Snoring 04/07/2022   Nocturia 04/07/2022   NSTEMI (non-ST elevated myocardial infarction) ([place]) 03/15/2022   Chest pain 03/15/2022   Elevated troponin    Vertigo 12/23/2021   New onset headache 12/23/2021   Left foot pain 09/20/2017   Plantar fasciitis of left foot 04/20/2017   Elevated blood pressure 08/15/2013   Back pain 08/14/2013   Hypothyroidism 01/31/2008   DEPRESSION 01/31/2008   ASTHMA 01/31/2008   ARTHRITIS 01/31/2008   GASTRITIS 12/21/2007   DIVERTICULOSIS, COLON 10/25/2003    PCP: Samuel Bouche, MD  REFERRING PROVIDER: Dene Gentry, MD  REFERRING DIAG: S29.012A (ICD-10-CM) - Strain of thoracic back  region  Rationale for Evaluation and Treatment: Rehabilitation  THERAPY DIAG:  Pain in thoracic spine  Cramp and spasm  Muscle weakness (generalized)  ONSET DATE: 06/26/2022  SUBJECTIVE:                                                                                                                                                                                           SUBJECTIVE STATEMENT: Pt reports "I was doing really until yesterday when I started having pain  again"  PERTINENT HISTORY:  Anxiety, Depression, Hypothyroidism, mild CAD, Hx of back surgery, migraine, vertigo  PAIN:  Are you having pain? Yes: NPRS scale: 2/10 Pain location: thoracic pain Pain description: poking Aggravating factors: unknown Relieving factors: unknown  PRECAUTIONS: None  WEIGHT BEARING RESTRICTIONS: No  FALLS:  Has patient fallen in last 6 months? No  LIVING ENVIRONMENT: Lives with: lives alone Lives in: House/apartment Stairs: Yes: Internal: 14 steps; on left going up Has following equipment at home: None  OCCUPATION: retired  PLOF: Independent and Leisure: outdoors, gardening, former Leisure centre manager  PATIENT GOALS: Play with grandchildren with less pain.  NEXT MD VISIT: 08/04/2022 with Dr Felipe Drone  OBJECTIVE:   DIAGNOSTIC FINDINGS:  Pt reports that she has had some radiographs with [facility]  PATIENT SURVEYS:  Eval:  FOTO 34% (projected 52% by visit 12)  SCREENING FOR RED FLAGS: Bowel or bladder incontinence: No Spinal tumors: No Cauda equina syndrome: No Compression fracture: No Abdominal aneurysm: No  COGNITION: Overall cognitive status: Within functional limits for tasks assessed     SENSATION: WFL  MUSCLE LENGTH: Hamstrings: mild tightness noted  POSTURE: rounded shoulders and forward head  PALPATION: Tender to palpation along left intercostals, left thoracic multifidi spasm noted  LUMBAR ROM:   Limited with pain  LOWER EXTREMITY ROM:     WFL  LOWER  EXTREMITY MMT:    Bilateral hip strength of grossly 4/5, bilateral shoulder abduction 4+/5  LUMBAR SPECIAL TESTS:  Slump test: Negative  FUNCTIONAL TESTS:  Eval:   5 times sit to stand: 12.2 sec without UE use (denies pain)  GAIT: Distance walked: >100 ft Assistive device utilized: None Level of assistance: Complete Independence Comments: Antalgic gait, has started using walking poles more  TODAY'S TREATMENT:                                                                                                                               DATE: 08/12/2022 Nustep level 5 x6 min with PT present to discuss status Seated shoulder ER and horizontal abduction with red tband 2x10 each Seated shoulder D2 with yellow tband x10 bilat (pt with cuing for technique and posture) Seated 3 way green pball rollout 5x10 sec each Supine lower trunk rotation x10 bilat Supine posterior pelvic tilts 2x10  Trigger Point Dry-Needling  Treatment instructions: Expect mild to moderate muscle soreness. S/S of pneumothorax if dry needled over a lung field, and to seek immediate medical attention should they occur. Patient verbalized understanding of these instructions and education. Patient Consent Given: Yes Education handout provided: Yes Muscles treated: bilat thoracic multifidi and bilat suboccipital Electrical stimulation performed: No Parameters: N/A Treatment response/outcome: Utilized skilled palpation to locate trigger points.  Able to illicit twitch response and palpable muscle elongation.   DATE: 08/05/2022 Nustep level x6 min with PT present to discuss status Seated shoulder ER and horizontal abduction with yellow tband 2x10 each Seated shoulder D2 with yellow tband x10 bilat (pt with  cuing for technique and posture) Seated 3 way green pball rollout 5x10 sec each Trigger Point Dry-Needling  Treatment instructions: Expect mild to moderate muscle soreness. S/S of pneumothorax if dry needled over a  lung field, and to seek immediate medical attention should they occur. Patient verbalized understanding of these instructions and education. Patient Consent Given: Yes Education handout provided: Yes Muscles treated: bilat thoracic multifidi and left rhomboid Electrical stimulation performed: No Parameters: N/A Treatment response/outcome: Utilized skilled palpation to locate trigger points.  Able to illicit twitch response and palpable muscle elongation. Manual Therapy:  soft tissue mobilization to thoracic and lumbar spine follow dry needling.   DATE: 07/22/2022 Nustep level 3 x 6 min with PT present to discuss status Review of previous HEP and incorporation of new exercises and alterations to exercises to decrease strain on back Added right shoulder stability exercises and provided handouts for HEP: prone shoulder ext, row, horizontal abduction, SL ER and serratus punch with 0# x 10 each     PATIENT EDUCATION:  Education details: Issued HEP Person educated: Patient Education method: Explanation, Demonstration, and Handouts Education comprehension: verbalized understanding and returned demonstration  HOME EXERCISE PROGRAM: Access Code: [email or web address] URL: [email or web address] Date: 07/22/2022 Prepared by: Candyce Churn  Exercises - Seated Cervical Rotation AROM  - 1 x daily - 7 x weekly - 2 sets - 10 reps - Seated Isometric Cervical Sidebending  - 1 x daily - 7 x weekly - 1 sets - 10 reps - Seated Assisted Cervical Rotation with Towel  - 1 x daily - 7 x weekly - 2 sets - 10 reps - Cervical Extension AROM with Strap  - 1 x daily - 7 x weekly - 2 sets - 10 reps - Hip Flexor Stretch with Chair  - 1 x daily - 7 x weekly - 2 sets - 10 reps - Gastroc Stretch on Wall  - 1 x daily - 7 x weekly - 1 sets - 10 reps - Wall Angels  - 1 x daily - 7 x weekly - 2 sets - 10 reps - Standing Bicep Curls Supinated with Dumbbells  - 1 x daily - 7 x weekly - 2 sets - 10 reps - Shoulder  Abduction with Dumbbells - Thumbs Up  - 1 x daily - 7 x weekly - 2 sets - 10 reps - Shoulder Overhead Press in Abduction with Dumbbells  - 1 x daily - 7 x weekly - 2 sets - 10 reps - Supine Posterior Pelvic Tilt  - 1 x daily - 7 x weekly - 2 sets - 10 reps - Supine March with Posterior Pelvic Tilt  - 1 x daily - 7 x weekly - 2 sets - 10 reps - Supine Hip Hike  - 1 x daily - 7 x weekly - 2 sets - 10 reps - Supine Chin Tuck  - 1 x daily - 7 x weekly - 2 sets - 10 reps - Supine Piriformis Stretch  - 1 x daily - 7 x weekly - 2 sets - 5 reps - 20 sec hold - Prone Hip Extension  - 1 x daily - 7 x weekly - 1 sets - 10 reps - Cat Cow  - 1 x daily - 7 x weekly - 2 sets - 10 reps - Sidelying Thoracic Rotation with Open Book  - 1 x daily - 7 x weekly - 1 sets - 10 reps - Prone Shoulder Extension - Single Arm  - 2 x daily - 7  x weekly - 2 sets - 10 reps - Prone Shoulder Row  - 2 x daily - 7 x weekly - 2 sets - 10 reps - Prone Single Arm Shoulder Horizontal Abduction with Scapular Retraction and Palm Down  - 2 x daily - 7 x weekly - 2 sets - 10 reps - Sidelying Shoulder External Rotation  - 2 x daily - 7 x weekly - 2 sets - 10 reps - Single Arm Serratus Punches in Supine with Dumbbell  - 2 x daily - 7 x weekly - 2 sets - 10 reps   ASSESSMENT:  CLINICAL IMPRESSION: Ms Christoff presents to skilled PT reporting some migraine symptoms this morning, but overall stating improvements in her thoracic pain.  Patient with some swirling vision migraine symptoms during visit and she was agreeable to dry needling to include suboccipitals.  Pt did report feeling more steady at end of session.  Pt continues to progress with strengthening and improved mobility.  Patient continues to report decreased pain following dry needling.  Patient to continue to progress towards goal related activities.  OBJECTIVE IMPAIRMENTS: dizziness, increased muscle spasms, impaired flexibility, postural dysfunction, and pain.   ACTIVITY  LIMITATIONS: carrying, lifting, bending, and standing  PARTICIPATION LIMITATIONS: community activity and yard work  PERSONAL FACTORS: 3+ comorbidities: migraines, dizziness, Hx of back surgery  are also affecting patient's functional outcome.   REHAB POTENTIAL: Good  CLINICAL DECISION MAKING: Stable/uncomplicated  EVALUATION COMPLEXITY: Low   GOALS: Goals reviewed with patient? Yes  SHORT TERM GOALS: Target date: 08/13/2022  Pt will be independent with initial HEP. Baseline: Goal status: MET  2.  Pt will report being able to sit on the floor with her grandchildren without increased pain. Baseline:  Goal status: IN PROGRESS (on 08/12/22, states that she has not tried again)   LONG TERM GOALS: Target date: 09/10/2022  Pt will be independent with advanced HEP. Baseline:  Goal status: INITIAL  2.  Pt will increase FOTO to at least 52% to demonstrate improvement in functional activities. Baseline:  Goal status: INITIAL  3.  Pt will increase bilateral hip strength to at least 4+ to 5-/5 to allow her to perform floor transfers with increased ease. Baseline:  Goal status: INITIAL  4.  Pt will report ability to return to walking program without increased pain. Baseline:  Goal status: INITIAL   PLAN:  PT FREQUENCY: 2x/week  PT DURATION: 8 weeks  PLANNED INTERVENTIONS: Therapeutic exercises, Therapeutic activity, Neuromuscular re-education, Balance training, Gait training, Patient/Family education, Self Care, Joint mobilization, Joint manipulation, Stair training, Vestibular training, Canalith repositioning, Aquatic Therapy, Dry Needling, Electrical stimulation, Spinal manipulation, Spinal mobilization, Cryotherapy, Moist heat, Taping, Traction, Ultrasound, Ionotophoresis '4mg'$ /ml Dexamethasone, Manual therapy, and Re-evaluation.  PLAN FOR NEXT SESSION: Add shoulder ER and horizontal abduction (and other strengthening) to HEP if pt able to complete with less cuing during session,  strengthening, manual therapy/dry needling as indicated   Juel Burrow, PT 08/12/22 11:10 AM   [place] [address], [place] [address] [place], [place] [address] Phone # [phone] Fax [phone]   PHYSICAL THERAPY DISCHARGE SUMMARY  As of 11/02/2022, pt has not returned for further follow up visits.  Patient agrees to discharge. Patient goals were not met. Patient is being discharged due to not returning since the last visit.  Michaeljoseph Revolorio, PT 11/02/22 9:52 AM

## 2022-08-12 NOTE — Telephone Encounter (Signed)
Refill sent as requested.   Please update the pt.  Thanks!

## 2022-08-12 NOTE — Telephone Encounter (Signed)
Pt called wanting to know if the provider can send her Galcanezumab-gnlm (EMGALITY) 120 MG/ML SOAJ Rx to [facility]. Pharmacy needs Rx faxed to [phone]

## 2022-08-25 IMAGING — CT CT HEAD W/O CM
3 series · 15 of 47 positions shown, 18 images · non-contrast
Comparison: None.

CLINICAL DATA: Dizziness



[Series 2: head wo · axial · 0.47mm/px · z∈[-138,-8]mm · 9 of 32 slices shown, 12 images]
[im 3/32  brain]
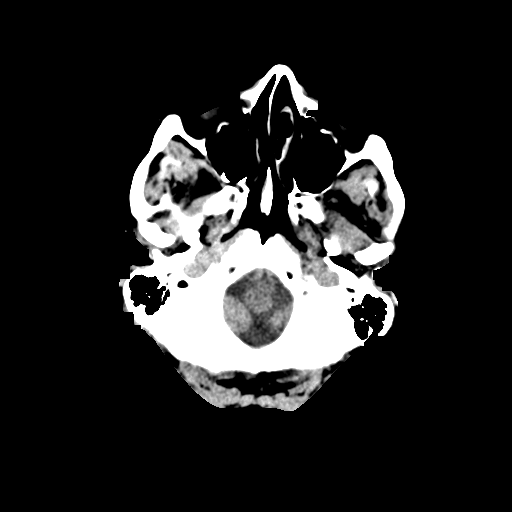
[im 3/32  bone]
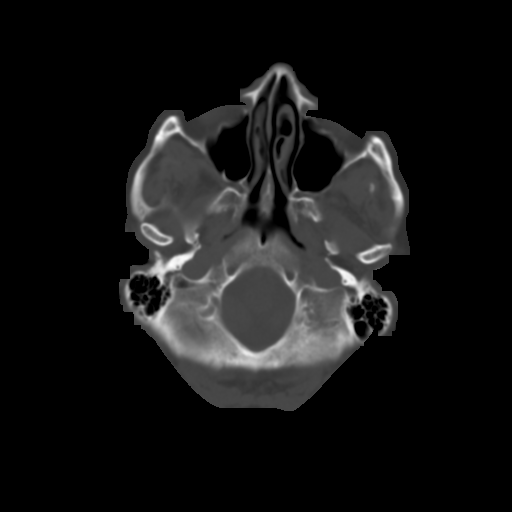
[im 6/32  brain]
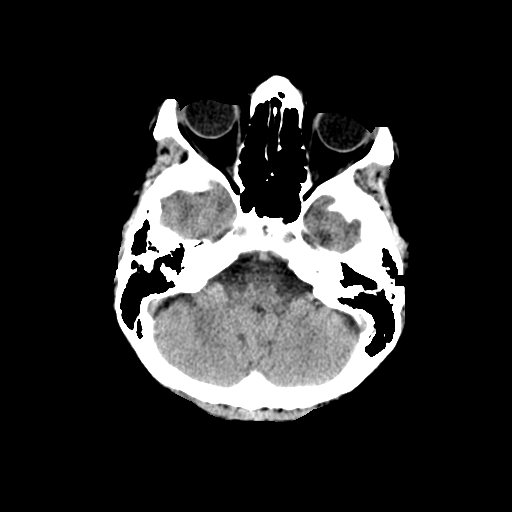
[im 9/32  brain]
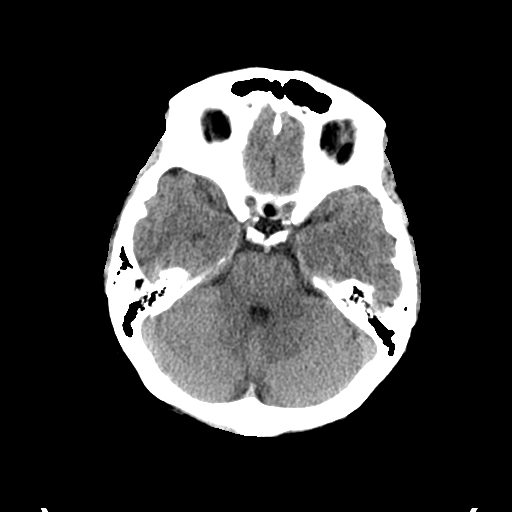
[im 12/32  brain]
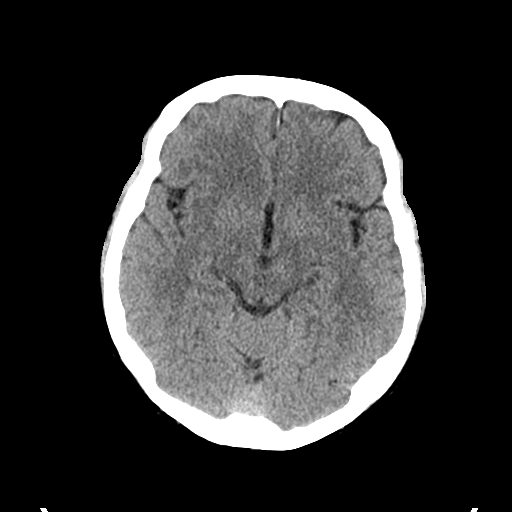
[im 17/32  brain]
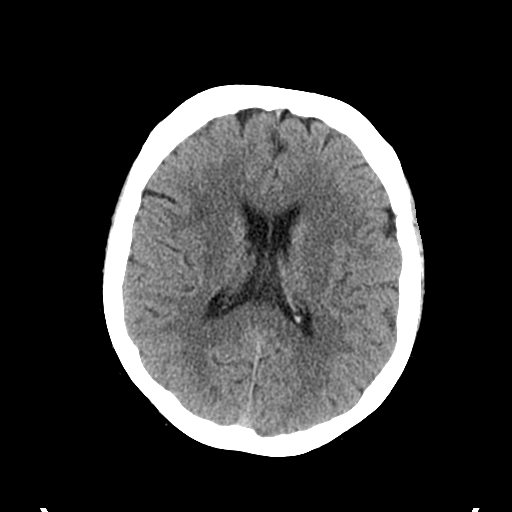
[im 17/32  bone]
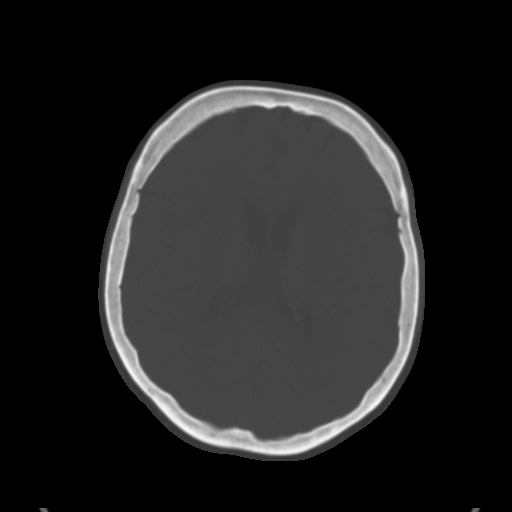
[im 20/32  brain]
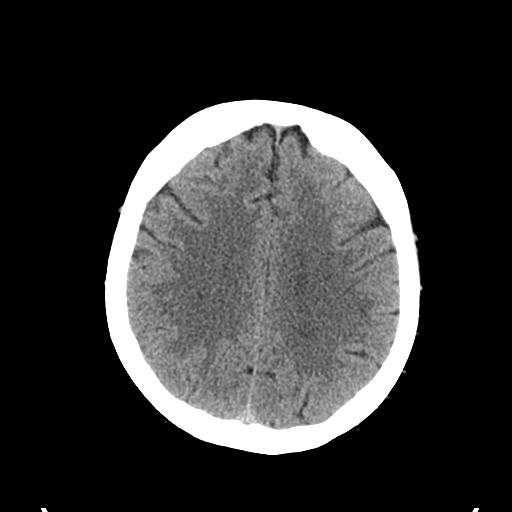
[im 23/32  brain]
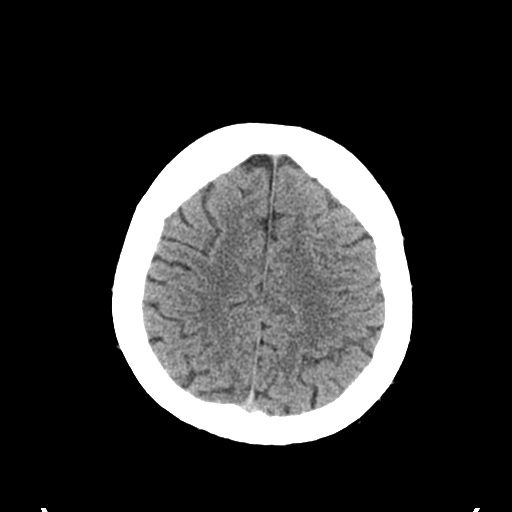
[im 26/32  brain]
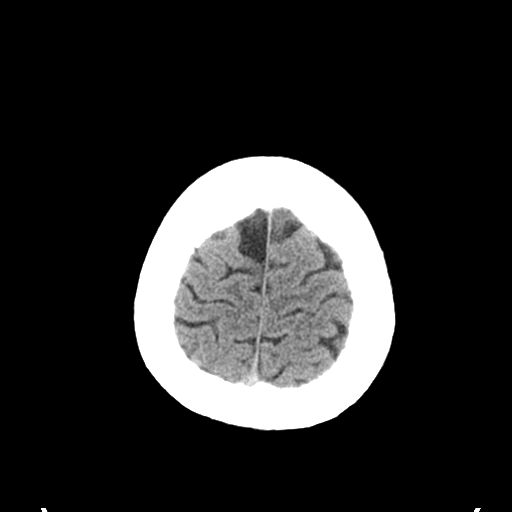
[im 29/32  brain]
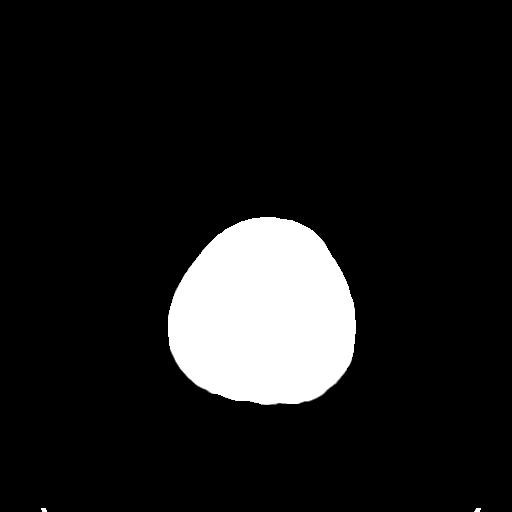
[im 29/32  bone]
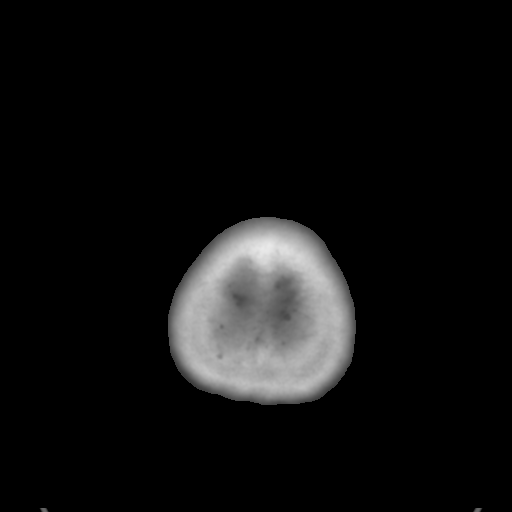

[Series 4: coronal soft tissue · coronal · 0.31mm/px · 3 of 70 slices shown]
[im 24/70  brain]
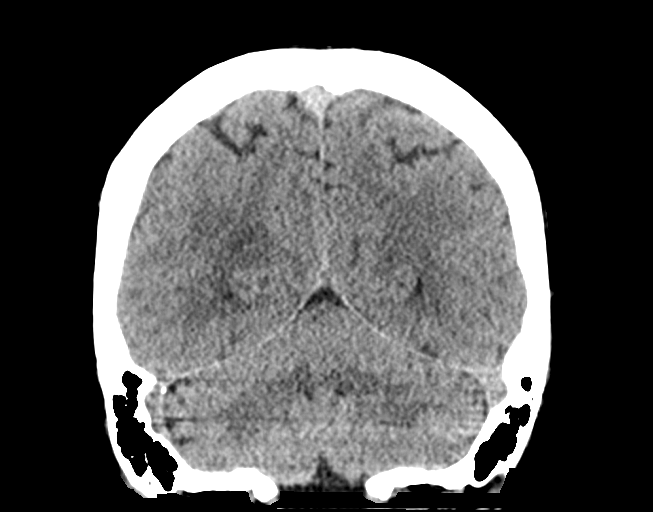
[im 31/70  brain]
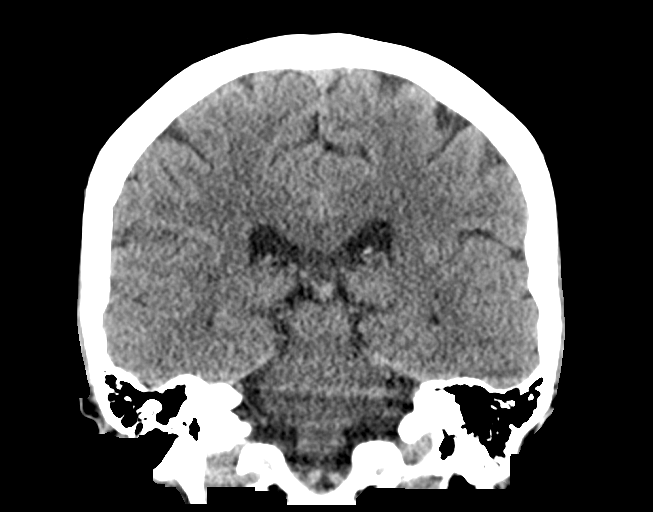
[im 39/70  brain]
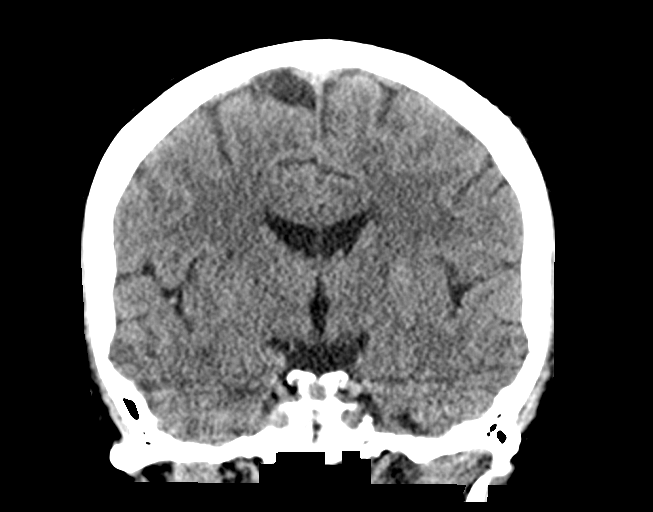

[Series 5: sagittal soft tissue · sagittal · 0.31mm/px · 3 of 69 slices shown]
[im 23/69  brain]
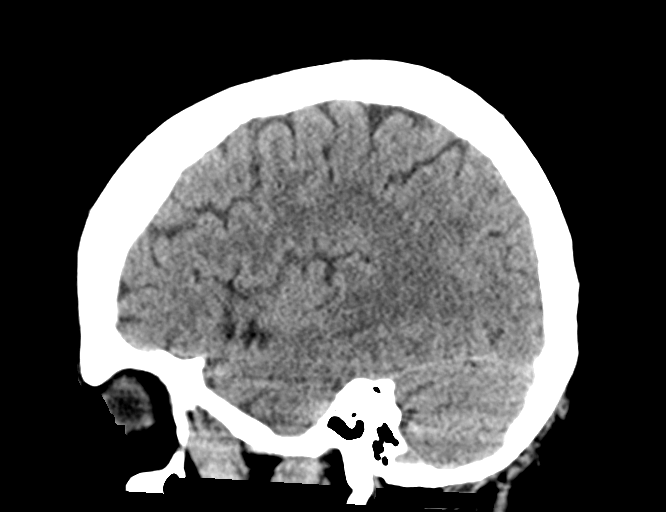
[im 35/69  brain]
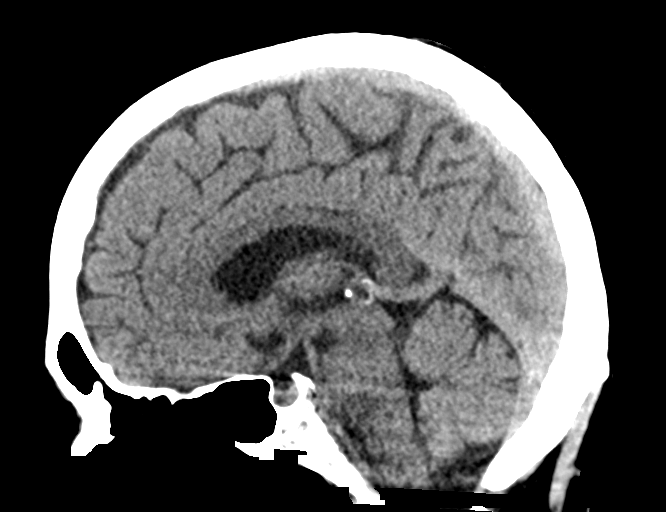
[im 46/69  brain]
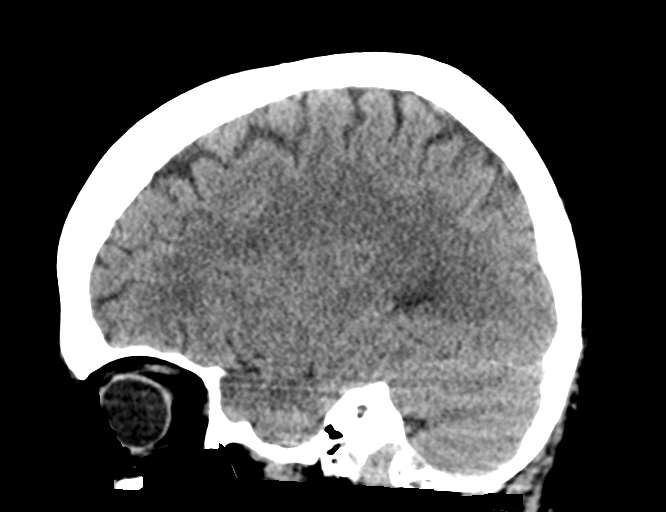

[15 of 47 positions shown; findings below may reference images not displayed]

FINDINGS: Brain: No acute intracranial findings are seen. Ventricles are not
dilated. There is no shift of midline structures. There are no
epidural or subdural fluid collections.

Vascular: Unremarkable.

Skull: Unremarkable.

Sinuses/Orbits: Unremarkable.

Other: None
IMPRESSION: No acute intracranial findings are seen in noncontrast CT brain.

## 2022-09-02 DIAGNOSIS — E039 Hypothyroidism, unspecified: Secondary | ICD-10-CM | POA: Diagnosis not present

## 2022-09-02 DIAGNOSIS — E785 Hyperlipidemia, unspecified: Secondary | ICD-10-CM | POA: Diagnosis not present

## 2022-09-07 DIAGNOSIS — E039 Hypothyroidism, unspecified: Secondary | ICD-10-CM | POA: Diagnosis not present

## 2022-09-07 DIAGNOSIS — E785 Hyperlipidemia, unspecified: Secondary | ICD-10-CM | POA: Diagnosis not present

## 2022-09-14 ENCOUNTER — Ambulatory Visit: Payer: Medicare Other | Admitting: Neurology

## 2022-09-14 ENCOUNTER — Encounter: Payer: Self-pay | Admitting: Neurology

## 2022-09-21 ENCOUNTER — Ambulatory Visit: Payer: BLUE CROSS/BLUE SHIELD | Admitting: Neurology

## 2022-09-21 ENCOUNTER — Encounter: Payer: Self-pay | Admitting: Neurology

## 2022-09-21 VITALS — BP 132/86 | HR 73 | Ht 67.0 in | Wt 165.0 lb

## 2022-09-21 DIAGNOSIS — R42 Dizziness and giddiness: Secondary | ICD-10-CM

## 2022-09-21 DIAGNOSIS — G43E09 Chronic migraine with aura, not intractable, without status migrainosus: Secondary | ICD-10-CM | POA: Diagnosis not present

## 2022-09-21 MED ORDER — EMGALITY 120 MG/ML ~~LOC~~ SOAJ: 120.0000 mg | SUBCUTANEOUS | 11 refills | Status: DC

## 2022-09-21 MED ORDER — AIMOVIG 140 MG/ML ~~LOC~~ SOAJ: 140.0000 mg | SUBCUTANEOUS | 11 refills | Status: DC

## 2022-09-21 MED ORDER — DIVALPROEX SODIUM ER 250 MG PO TB24: 250.0000 mg | ORAL_TABLET | Freq: Every day | ORAL | 5 refills | Status: DC

## 2022-09-21 NOTE — Progress Notes (Addendum)
Patient: [name] Date of Birth: 1950/09/26  Reason for Visit: Follow up History from: Patient Primary Neurologist: Dr. Terrace Arabia   ASSESSMENT AND PLAN 72 y.o. year old female   1.  Acute onset of dizziness with moderate to severe prolonged headaches, light sensitivity, nausea -Felt consistent with migraine variant -Initially very good benefit with Emgality, seems to suggest some migraine component -Reviewed with Dr. Terrace Arabia, encouraged to continue Emgality monthly injection, will add on Depakote ER 250 mg at bedtime -Needs follow-up with ophthalmology concerning blurry vision  -Continue Ubrelvy as needed for acute spells -Sleep study failed to show any sleep disordered breathing  -Previously tried and failed: Topamax, did not want to continue Fioricet due to concern that it was a barbiturate, unwilling to try Effexor due to side effect from antidepressants, Nurtec was too expensive, nerve block -Reportedly labs from PCP 03/19/22 ESR, CRP were normal, 01/12/22 labs from PCP showed normal CRP, ESR slightly elevated at 23 -MRI brain showed small vessel disease most consistent with migraine headaches -I would like for her to see Dr. Terrace Arabia in about 4 months for follow-up ensure continued improvement 6 -We discussed going back to vestibular rehab, but she like to try 1 thing at a time, will start with the addition of Depakote  HISTORY  [name], is a 72 year old female seen in request by her primary care physician Dr. Laurann Montana, for evaluation of dizziness, headaches, initial evaluation was on December 23, 2021   I reviewed and summarized the referring note. PMHX. Hypothyroidism Depression,  Lumbar decompression surgery in 2013.   She had her first onset of dizziness on October 27, 2021, she stepped out of the car turning towards the left, then had sudden onset dizziness, felt her whole brain was in rotation, her neighbor was able to help drive her back home, she was able to walk into  her house, symptoms persist and few hours later, she was evaluated at emergency room  Personally reviewed CT head without contrast, no acute intracranial abnormality noted   But her dizziness sensation, later light sensitivity headache persistent for few days, she was able to see by her ophthalmologist, there was no significant abnormality noted, her symptoms gradually improved after few days   She went to vestibular rehab in March 2023, overall has much improved  About a week ago, she began to experience similar sensation again, holoacranial headaches, unsteady sensation, extreme light sensitivity, neck muscle tension, also retro-orbital area pain,  She tried naproxen with limited help, also have nausea, she denies difficulty talking, using her arms and legs   She denies a history of typical migraine in the past,  Update Jan 25, 2022 SS: Laboratory evaluation at office visit in December 23, 2021 showed significantly elevated CRP to 9, ESR 48, ALT 50, UA showed large leukocyte many bacteria, positive for E. Coli. Repeat labs 12/28/21 CRP 100, ESR 102.  MRI of the brain showed no acute abnormality, there was small vessel disease most consistent with history of migraine headaches.  In ER 01/01/22 for low BP from PCP, was 121/78 in ER, given IV fluids, IV Compazine, Benadryl, Toradol.  01/12/22, ESR slightly elevated 23, CRP normal. Normal CMP creatinine 0.61.  Feels daily headache, are mild, gets woken up by horrible migraine at 3-4 AM, most nights, like band around back of head, tried different pillow, tried sleeping on couch, persistent since 1st office visit, drinks water, is miserable, Fioricet helped in past. Snores at night, mouth is dry upon  wakening.   Couldn't get Nurtec, was too expensive. Took Topamax 100 mg twice daily only 1 day, felt like she was on drugs. Feels each day gets better, initially couldn't walk had to use walking poles. Yesterday walked 2 miles without sticks. Went to church  yesterday, the bright light triggered horrible migraine. Wearing dark sunglasses today. No vomiting in 2.5 weeks with headache.   Wants to be back to normal, balance isn't steady. Driving yesterday, saw black and white lines in the street, triggered spinning in her head.   Update March 23, 2022 SS: Admitted 03/14/22 for severe left upper chest pain, SOB, emesis, diaphoresis, and elevated troponin.  Started on heparin and aspirin.  Underwent cardiac cath, 20% proximal right CCA stenosis, continue on aspirin, she refused statin.   Claims high pitched sound in her ears constant (has seen ENT), has headache most of the time feels like clamp, pain to right jaw for over 1 year, had molar removed, told vertical root fracture. Fluorescent light triggers, if she closes her eyes, can see bright light then recedes, can be staircase. Wears sunglasses. Lives alone, keeps her apartment dark, screens are triggers. All of a sudden brain starts "swirling", room spins counter clock wise. On 03/14/22 this happened, began violent vomiting, took her meclizine. Then developed CP (see above, for positive troponin). This terrible spell happened on heavy rain weather change, power was off, then was looking at laptop when symptoms started. PCP saw 03/19/22, patient reports ESR, CRP, TSH were now normal.   After last office visit with me 01/25/22 could not tolerate even Topamax 50 mg at bedtime, explained flashing lights in the brain, could not sleep.  Is not willing to try Effexor.  Update April 28, 2022 SS: Saw Dr. Brett Fairy for sleep consult 04/07/22 sleep study was ordered. Started Emgality in July loading dose, did 2nd last week. Last week had 3 days of ZERO headache. Sunday woke up in middle of night with headache, wanted to go to church, felt okay until lights changed, 4 hours of disoriented, didn't take the [place]. Yesterday woke up feeling same, headache worsening, felt areas in head "opening up", definitely reduced it. Is sleeping  better, getting up few times to urinate. Offered amitriptyline by Dr. Brett Fairy. Seems to be moving about more freely.   Update September 21, 2022 SS: Had sleep study in September 2023, no significant sleep disordered breathing was present.  Suggested imipramine to help with tension type headache and insomnia. Remains on Emgality, was doing better, then more headache in November and December, was mild, no vertigo. The vertigo has slowly returned. Had bad episode last week, had to cancel due to weather and episode of vertigo. Has light sensitivity to trigger episodes. Has some degree of headache every day. Rarely takes Roselyn Meier, took it recently during bad headache, "opened head up", but no change in dizziness. Few months ago 80% better with Emgality. Vision in left eye is blurry, tried to have eye exam, but the lights made her "run out".  She doesn't want to try antidepressant, in past 1 made her suicidal. She isn't found of medications.   REVIEW OF SYSTEMS: Out of a complete 14 system review of symptoms, the patient complains only of the following symptoms, and all other reviewed systems are negative.  See HPI  ALLERGIES: Allergies  Allergen Reactions   Flagyl [Metronidazole] Anaphylaxis   Hydrocodone-Acetaminophen     Other reaction(s): Confusion, Confusion (intolerance)   Tramadol Nausea Only and Nausea And Vomiting  Alendronate     Other reaction(s): Other (See Comments)   Amoxicillin     rash   Azithromycin     Other reaction(s): Other (See Comments)   Buspar [Buspirone]     Heart palpations   Buspirone Hcl     Other reaction(s): Irregular Heart Rate   Celebrex [Celecoxib]     Chest pain   Cymbalta [Duloxetine Hcl]     depression   Duloxetine     Other reaction(s): Other (See Comments)   Estradiol     Other reaction(s): Other (See Comments)   Gabapentin    Hydroxychloroquine     Other reaction(s): Other (See Comments)   Meloxicam Nausea Only   Tizanidine Other (See Comments)     spinning   Topiramate Other (See Comments)    "Spinning" when taken with tizanidine    HOME MEDICATIONS: Outpatient Medications Prior to Visit  Medication Sig Dispense Refill   baclofen (LIORESAL) 10 MG tablet Take 1 tablet (10 mg total) by mouth 3 (three) times daily as needed for muscle spasms. 60 each 0   diphenhydrAMINE (BENADRYL) 25 MG tablet Take 25 mg by mouth every 6 (six) hours as needed.     halobetasol (ULTRAVATE) 0.05 % cream Apply topically 2 (two) times daily.     Levothyroxine Sodium 137 MCG CAPS   1   ondansetron (ZOFRAN) 8 MG tablet      Ubrogepant (UBRELVY) 100 MG TABS Take 100 mg by mouth as needed (take 1 at onset of headache, max is 200 mg in 24 hours). 10 tablet 11   Galcanezumab-gnlm (EMGALITY) 120 MG/ML SOAJ Inject contents of 1 pen every 28 days 1.12 mL 1   Galcanezumab-gnlm (EMGALITY) 120 MG/ML SOAJ Inject 120 mg into the skin every 30 (thirty) days. 1.12 mL 11   ALPRAZolam (XANAX) 0.5 MG tablet Take 1 tablet (0.5 mg total) by mouth once as needed for up to 1 dose for anxiety (Take 1 tablet at bedtime in sleep lab, may use additional tablet if needed.). (Patient not taking: Reported on 07/16/2022) 2 tablet 0   No facility-administered medications prior to visit.    PAST MEDICAL HISTORY: Past Medical History:  Diagnosis Date   Anxiety    Depression    Hypothyroidism    Mild CAD    cath 03/2022: 20% proximal RCA stenosis   Thyroid disease     PAST SURGICAL HISTORY: Past Surgical History:  Procedure Laterality Date   BACK SURGERY     BUNIONECTOMY Right    CESAREAN SECTION     LEFT HEART CATH AND CORONARY ANGIOGRAPHY N/A 03/15/2022   Procedure: LEFT HEART CATH AND CORONARY ANGIOGRAPHY;  Surgeon: Kathleene Hazel, MD;  Location: [facility];  Service: Cardiovascular;  Laterality: N/A;    FAMILY HISTORY: Family History  Problem Relation Age of Onset   Breast cancer Mother    Hashimoto's thyroiditis Mother    Arthritis Mother    Heart  attack Father    CAD Father    Stroke Brother     SOCIAL HISTORY: Social History   Socioeconomic History   Marital status: Divorced    Spouse name: Not on file   Number of children: 3   Years of education: Not on file   Highest education level: Bachelor's degree (e.g., BA, AB, BS)  Occupational History   Not on file  Tobacco Use   Smoking status: Never   Smokeless tobacco: Never  Vaping Use   Vaping Use: Never used  Substance and Sexual Activity   Alcohol use: Not Currently   Drug use: No   Sexual activity: Not on file  Other Topics Concern   Not on file  Social History Narrative   Lives alone    R handed   Caffeine: 3-4 c of Coffee a day   Social Determinants of Health   Financial Resource Strain: Not on file  Food Insecurity: Not on file  Transportation Needs: Not on file  Physical Activity: Not on file  Stress: Not on file  Social Connections: Not on file  Intimate Partner Violence: Not on file   PHYSICAL EXAM  Vitals:   09/21/22 0957  BP: 132/86  Pulse: 73  Weight: 165 lb (74.8 kg)  Height: 5\' 7"  (1.702 m)    Body mass index is 25.84 kg/m.  Generalized: Well developed, in no acute distress, wearing dark sunglasses Neurological examination  Mentation: Alert oriented to time, place, history taking. Follows all commands speech and language fluent Cranial nerve II-XII: Pupils were equal round reactive to light. Extraocular movements were full, visual field were full on confrontational test. Facial sensation and strength were normal. Head turning and shoulder shrug were normal and symmetric. Motor: The motor testing reveals 5 over 5 strength of all 4 extremities. Good symmetric motor tone is noted throughout.  Sensory: Sensory testing is intact to soft touch on all 4 extremities. No evidence of extinction is noted.  Coordination: Cerebellar testing reveals good finger-nose-finger and heel-to-shin bilaterally.  Gait and station: Gait is normal, but  cautious.  Reflexes: Deep tendon reflexes are symmetric and normal bilaterally.   DIAGNOSTIC DATA (LABS, IMAGING, TESTING) - I reviewed patient records, labs, notes, testing and imaging myself where available.  Lab Results  Component Value Date   WBC 6.8 03/15/2022   HGB 12.8 03/15/2022   HCT 37.2 03/15/2022   MCV 91.0 03/15/2022   PLT 261 03/15/2022      Component Value Date/Time   NA 142 03/15/2022 0507   NA 138 12/28/2021 1458   K 3.7 03/15/2022 0507   CL 110 03/15/2022 0507   CO2 24 03/15/2022 0507   GLUCOSE 102 (H) 03/15/2022 0507   BUN 8 03/15/2022 0507   BUN 12 12/28/2021 1458   CREATININE 0.69 03/15/2022 0507   CALCIUM 8.8 (L) 03/15/2022 0507   PROT 6.5 03/14/2022 1935   PROT 6.2 12/28/2021 1458   ALBUMIN 3.6 03/14/2022 1935   ALBUMIN 3.3 (L) 12/28/2021 1458   AST 23 03/14/2022 1935   ALT 18 03/14/2022 1935   ALKPHOS 76 03/14/2022 1935   BILITOT 0.5 03/14/2022 1935   BILITOT 0.4 12/28/2021 1458   GFRNONAA >60 03/15/2022 0507   GFRAA >90 08/15/2013 0400   Lab Results  Component Value Date   CHOL 193 03/15/2022   HDL 66 03/15/2022   LDLCALC 119 (H) 03/15/2022   TRIG 39 03/15/2022   CHOLHDL 2.9 03/15/2022   No results found for: "HGBA1C" No results found for: "VITAMINB12" Lab Results  Component Value Date   TSH 1.656 03/15/2022    Butler Denmark, AGNP-C, DNP 10/06/2022, 8:50 AM [facility] [address] [place], [place] [address] [phone]   Addendum: Reviewed evaluation by [facility] ophthalmology associates Dr. Melissa Noon on September 28, 2022, no signs of intraocular inflammation and showing, no acute anatomic pathology is observed to explain her symptoms

## 2022-09-21 NOTE — Patient Instructions (Addendum)
After discussion with Dr. Krista Blue, you and I spoke on the phone, will continue Emgality, add on Depakote Please follow-up with ophthalmology  Meds ordered this encounter  Medications   DISCONTD: Erenumab-aooe (AIMOVIG) 140 MG/ML SOAJ    Sig: Inject 140 mg into the skin every 28 (twenty-eight) days.    Dispense:  1.12 mL    Refill:  11   divalproex (DEPAKOTE ER) 250 MG 24 hr tablet    Sig: Take 1 tablet (250 mg total) by mouth at bedtime.    Dispense:  30 tablet    Refill:  5   Galcanezumab-gnlm (EMGALITY) 120 MG/ML SOAJ    Sig: Inject 120 mg into the skin every 30 (thirty) days.    Dispense:  1.12 mL    Refill:  11

## 2022-09-28 DIAGNOSIS — H5202 Hypermetropia, left eye: Secondary | ICD-10-CM | POA: Diagnosis not present

## 2022-09-29 ENCOUNTER — Telehealth: Payer: Self-pay | Admitting: Neurology

## 2022-09-29 NOTE — Addendum Note (Signed)
Addended by: Suzzanne Cloud on: 09/29/2022 04:07 PM   Modules accepted: Orders

## 2022-09-29 NOTE — Telephone Encounter (Signed)
Referral for Neurology-balance disorder fax to [facility]. Phone: [phone], Fax: [phone].

## 2022-10-25 ENCOUNTER — Encounter: Payer: Self-pay | Admitting: Neurology

## 2022-11-12 DIAGNOSIS — H6121 Impacted cerumen, right ear: Secondary | ICD-10-CM | POA: Diagnosis not present

## 2022-11-12 DIAGNOSIS — B09 Unspecified viral infection characterized by skin and mucous membrane lesions: Secondary | ICD-10-CM | POA: Diagnosis not present

## 2022-11-16 DIAGNOSIS — R42 Dizziness and giddiness: Secondary | ICD-10-CM | POA: Diagnosis not present

## 2022-11-16 DIAGNOSIS — H9311 Tinnitus, right ear: Secondary | ICD-10-CM | POA: Diagnosis not present

## 2022-11-16 DIAGNOSIS — H8110 Benign paroxysmal vertigo, unspecified ear: Secondary | ICD-10-CM | POA: Diagnosis not present

## 2022-11-16 DIAGNOSIS — R410 Disorientation, unspecified: Secondary | ICD-10-CM | POA: Diagnosis not present

## 2022-11-29 ENCOUNTER — Ambulatory Visit: Payer: Medicare Other | Admitting: Family Medicine

## 2022-11-29 VITALS — BP 136/77 | Ht 68.0 in | Wt 160.0 lb

## 2022-11-29 DIAGNOSIS — S29012A Strain of muscle and tendon of back wall of thorax, initial encounter: Secondary | ICD-10-CM | POA: Diagnosis not present

## 2022-11-29 MED ORDER — METHYLPREDNISOLONE ACETATE 80 MG/ML IJ SUSP
80.0000 mg | Freq: Once | INTRAMUSCULAR | Status: AC
  Administered 2022-11-29: 80 mg via INTRAMUSCULAR

## 2022-11-29 MED ORDER — KETOROLAC TROMETHAMINE 60 MG/2ML IM SOLN
60.0000 mg | Freq: Once | INTRAMUSCULAR | Status: AC
  Administered 2022-11-29: 60 mg via INTRAMUSCULAR

## 2022-11-29 NOTE — Patient Instructions (Signed)
You were given injections of toradol and a steroid today. Do home exercises/stretches as tolerated. Heat 15 minutes at a time 3-4 times a day. We will go ahead with an MRI of your thoracic spine since this isn't improving with conservative measures including medications, PT, home exercises. Follow up will depend on the MRI results.

## 2022-11-29 NOTE — Progress Notes (Addendum)
PCP: Harlan Stains, MD  Subjective:   HPI: Patient is a 72 y.o. female here for back pain.  Patient was most recently seen in sports medicine office on 08/04/2022 for upper back strain, at that time continued on PT.  Patient reports that she has had ongoing back pain for several years.  Reports worsening of the pain in the past 3 to 4 weeks.  No known trigger. Pain is located in the left mid back. She has tried ice, heat, acetaminophen, muscle relaxants without relief.  She has been doing home exercises at home. No radiation of pain down legs. Denies numbness, tingling, weakness, saddle anesthesia, bowel/bladder incontinence. She is interested in getting an injection of her back and an MRI.  Remote MRI lumbar spine and thoracic spine 08/15/2013 showed:  MR THORACIC SPINE IMPRESSION   Mild thoracic disc degeneration as above. Negative for fracture or  mass lesion. No cord compression in the thoracic spine.   MR LUMBAR SPINE IMPRESSION   Prior laminectomy left L5-S1. There is spondylosis at L5-S1 with  foraminal narrowing left greater than right.   Left foraminal disc protrusion L4-5 with narrowing of the left  lateral recess and left foramen at L4-5   Past Medical History:  Diagnosis Date   Anxiety    Depression    Hypothyroidism    Mild CAD    cath 03/2022: 20% proximal RCA stenosis   Thyroid disease     Current Outpatient Medications on File Prior to Visit  Medication Sig Dispense Refill   baclofen (LIORESAL) 10 MG tablet Take 1 tablet (10 mg total) by mouth 3 (three) times daily as needed for muscle spasms. 60 each 0   diphenhydrAMINE (BENADRYL) 25 MG tablet Take 25 mg by mouth every 6 (six) hours as needed.     divalproex (DEPAKOTE ER) 250 MG 24 hr tablet Take 1 tablet (250 mg total) by mouth at bedtime. 30 tablet 5   Galcanezumab-gnlm (EMGALITY) 120 MG/ML SOAJ Inject 120 mg into the skin every 30 (thirty) days. 1.12 mL 11   halobetasol (ULTRAVATE) 0.05 % cream Apply  topically 2 (two) times daily.     Levothyroxine Sodium 137 MCG CAPS   1   ondansetron (ZOFRAN) 8 MG tablet      Ubrogepant (UBRELVY) 100 MG TABS Take 100 mg by mouth as needed (take 1 at onset of headache, max is 200 mg in 24 hours). 10 tablet 11   No current facility-administered medications on file prior to visit.    Past Surgical History:  Procedure Laterality Date   BACK SURGERY     BUNIONECTOMY Right    CESAREAN SECTION     LEFT HEART CATH AND CORONARY ANGIOGRAPHY N/A 03/15/2022   Procedure: LEFT HEART CATH AND CORONARY ANGIOGRAPHY;  Surgeon: Burnell Blanks, MD;  Location: [facility];  Service: Cardiovascular;  Laterality: N/A;    Allergies  Allergen Reactions   Flagyl [Metronidazole] Anaphylaxis   Hydrocodone-Acetaminophen     Other reaction(s): Confusion, Confusion (intolerance)   Tramadol Nausea Only and Nausea And Vomiting   Alendronate     Other reaction(s): Other (See Comments)   Amoxicillin     rash   Azithromycin     Other reaction(s): Other (See Comments)   Buspar [Buspirone]     Heart palpations   Buspirone Hcl     Other reaction(s): Irregular Heart Rate   Celebrex [Celecoxib]     Chest pain   Cymbalta [Duloxetine Hcl]     depression  Duloxetine     Other reaction(s): Other (See Comments)   Estradiol     Other reaction(s): Other (See Comments)   Gabapentin    Hydroxychloroquine     Other reaction(s): Other (See Comments)   Meloxicam Nausea Only   Tizanidine Other (See Comments)    spinning   Topiramate Other (See Comments)    "Spinning" when taken with tizanidine    Social History   Socioeconomic History   Marital status: Divorced    Spouse name: Not on file   Number of children: 3   Years of education: Not on file   Highest education level: Bachelor's degree (e.g., BA, AB, BS)  Occupational History   Not on file  Tobacco Use   Smoking status: Never   Smokeless tobacco: Never  Vaping Use   Vaping Use: Never used   Substance and Sexual Activity   Alcohol use: Not Currently   Drug use: No   Sexual activity: Not on file  Other Topics Concern   Not on file  Social History Narrative   Lives alone    R handed   Caffeine: 3-4 c of Coffee a day   Social Determinants of Health   Financial Resource Strain: Not on file  Food Insecurity: Not on file  Transportation Needs: Not on file  Physical Activity: Not on file  Stress: Not on file  Social Connections: Not on file  Intimate Partner Violence: Not on file    Family History  Problem Relation Age of Onset   Breast cancer Mother    Hashimoto's thyroiditis Mother    Arthritis Mother    Heart attack Father    CAD Father    Stroke Brother     BP 136/77   Ht 5\' 8"  (1.727 m)   Wt 160 lb (72.6 kg)   BMI 24.33 kg/m   Review of Systems: See HPI above.     Objective:  Physical Exam:  Gen: awake, alert, NAD, comfortable in exam room Pulm: breathing unlabored  Back: Kyphosis noted. No tenderness to palpation along midline spine.  Tenderness left thoracic paraspinal region. Flexion, extension, lateral flexion, and rotation without pain. 5/5 strength lower extremities. Normal sensation lower extremities.   Assessment & Plan:  1.  Acute on chronic thoracic back pain Patient with underlying chronic thoracic back pain and OA of spine as evidenced by prior MRI from 2014 presenting with flareup of thoracic back pain in the past 3 to 4 weeks with unclear trigger.  I think kyphosis could be contributing to her chronic back pain, and probably has more recent muscle spasm causing flareup of pain.  No signs of sciatica and no red flag signs.  Pain has been refractory to conservative treatment including PT and home exercises, so I think IM corticosteroid injection and ketorolac injection would be reasonable and to also repeat thoracic spine MRI. - IM ketorolac 60 mg administered - IM methylprednisolone 80 mg administered - MRI thoracic spine - continue  home exercises and conservative measures - follow-up pending MRI results  Zola Button, MD [place], PGY-3

## 2022-12-08 ENCOUNTER — Encounter: Payer: Self-pay | Admitting: Neurology

## 2022-12-08 DIAGNOSIS — R42 Dizziness and giddiness: Secondary | ICD-10-CM

## 2022-12-08 DIAGNOSIS — G43E09 Chronic migraine with aura, not intractable, without status migrainosus: Secondary | ICD-10-CM

## 2022-12-10 ENCOUNTER — Ambulatory Visit
Admission: RE | Admit: 2022-12-10 | Discharge: 2022-12-10 | Disposition: A | Discharge status: Home / Self Care | Payer: Medicare Other | Source: Ambulatory Visit | Attending: Family Medicine | Admitting: Family Medicine

## 2022-12-10 ENCOUNTER — Encounter: Payer: Self-pay | Admitting: Family Medicine

## 2022-12-10 DIAGNOSIS — M546 Pain in thoracic spine: Secondary | ICD-10-CM | POA: Diagnosis not present

## 2022-12-10 DIAGNOSIS — S29012A Strain of muscle and tendon of back wall of thorax, initial encounter: Secondary | ICD-10-CM

## 2022-12-13 NOTE — Addendum Note (Signed)
Addended by: Glean Salvo on: 12/13/2022 01:35 PM   Modules accepted: Orders

## 2022-12-14 ENCOUNTER — Telehealth: Payer: Self-pay | Admitting: Neurology

## 2022-12-14 NOTE — Telephone Encounter (Signed)
Referral for ENT sent to [facility] (fax# [phone], phone# [phone])

## 2022-12-20 ENCOUNTER — Encounter: Payer: Self-pay | Admitting: Family Medicine

## 2022-12-21 ENCOUNTER — Other Ambulatory Visit: Payer: Medicare Other

## 2022-12-21 DIAGNOSIS — E559 Vitamin D deficiency, unspecified: Secondary | ICD-10-CM | POA: Diagnosis not present

## 2022-12-21 DIAGNOSIS — E039 Hypothyroidism, unspecified: Secondary | ICD-10-CM | POA: Diagnosis not present

## 2022-12-21 DIAGNOSIS — E785 Hyperlipidemia, unspecified: Secondary | ICD-10-CM | POA: Diagnosis not present

## 2022-12-23 DIAGNOSIS — E785 Hyperlipidemia, unspecified: Secondary | ICD-10-CM | POA: Diagnosis not present

## 2022-12-23 DIAGNOSIS — F325 Major depressive disorder, single episode, in full remission: Secondary | ICD-10-CM | POA: Diagnosis not present

## 2022-12-23 DIAGNOSIS — G43909 Migraine, unspecified, not intractable, without status migrainosus: Secondary | ICD-10-CM | POA: Diagnosis not present

## 2022-12-23 DIAGNOSIS — E039 Hypothyroidism, unspecified: Secondary | ICD-10-CM | POA: Diagnosis not present

## 2022-12-23 DIAGNOSIS — Z0000 Encounter for general adult medical examination without abnormal findings: Secondary | ICD-10-CM | POA: Diagnosis not present

## 2022-12-23 DIAGNOSIS — I2510 Atherosclerotic heart disease of native coronary artery without angina pectoris: Secondary | ICD-10-CM | POA: Diagnosis not present

## 2022-12-28 DIAGNOSIS — K080 Exfoliation of teeth due to systemic causes: Secondary | ICD-10-CM | POA: Diagnosis not present

## 2023-01-03 ENCOUNTER — Ambulatory Visit: Payer: Medicare Other | Admitting: Family Medicine

## 2023-01-03 VITALS — BP 126/82 | Ht 67.0 in | Wt 155.0 lb

## 2023-01-03 DIAGNOSIS — S29012A Strain of muscle and tendon of back wall of thorax, initial encounter: Secondary | ICD-10-CM | POA: Diagnosis not present

## 2023-01-03 NOTE — Progress Notes (Signed)
Patient returned today for trigger point injections to left thoracic paraspinal region (see discussion in MRI results).   After informed written consent timeout was performed.  Patient was lying prone on exam table.  Area overlying left thoracic paraspinal musculature prepped with alcohol swabs.  Then utilizing ultrasound guidance, 5mL lidocaine without epinephrine injected into three trigger points in left thoracic paraspinal muscles.  Patient tolerated procedure well without immediate complications.

## 2023-01-11 DIAGNOSIS — K080 Exfoliation of teeth due to systemic causes: Secondary | ICD-10-CM | POA: Diagnosis not present

## 2023-01-25 ENCOUNTER — Ambulatory Visit: Payer: Medicare Other | Admitting: Neurology

## 2023-03-15 DIAGNOSIS — K080 Exfoliation of teeth due to systemic causes: Secondary | ICD-10-CM | POA: Diagnosis not present

## 2023-03-17 ENCOUNTER — Telehealth: Payer: Self-pay

## 2023-03-17 ENCOUNTER — Other Ambulatory Visit (HOSPITAL_COMMUNITY): Payer: Self-pay

## 2023-03-17 NOTE — Telephone Encounter (Signed)
Pharmacy Patient Advocate Encounter   Received notification from Physician's Office that prior authorization for Emgality 120MG /ML auto-injectors (migraine) is required/requested.   Insurance verification completed.   The patient is insured through [facility] .   Per test claim:    PA started via CoverMyMeds. KEY [id number] . Waiting for clinical questions to populate.

## 2023-03-17 NOTE — Telephone Encounter (Signed)
Pharmacy Patient Advocate Encounter   Received notification from [facility] that prior authorization for Ubrelvy 100MG  tablets is required/requested.   PA submitted to [facility] via CoverMyMeds Key or (Medicaid) confirmation # [id number] Status is pending

## 2023-03-18 ENCOUNTER — Other Ambulatory Visit (HOSPITAL_COMMUNITY): Payer: Self-pay

## 2023-03-18 NOTE — Telephone Encounter (Addendum)
Pharmacy Patient Advocate Encounter  Received notification from [facility] that Prior Authorization for Emgality 120MG /ML auto-injectors (migraine) has been APPROVED from 03/17/2023 to 03/16/2024.Marland Kitchen  PA #/Case ID/Reference #: [id number] PA [id number]  Copay is $45.00 per 30DS per [facility] test claim.

## 2023-03-18 NOTE — Telephone Encounter (Signed)
Pharmacy Patient Advocate Encounter  Received notification from [facility] that Prior Authorization for Ubrelvy 100MG  tablets has been APPROVED from 03/17/2023 to 03/16/2024.Marland Kitchen  PA #/Case ID/Reference #: PA Case ID #: [id number]  Copay is $45.00 per 30DS per [facility] test claim.

## 2023-03-28 DIAGNOSIS — L821 Other seborrheic keratosis: Secondary | ICD-10-CM | POA: Diagnosis not present

## 2023-03-28 DIAGNOSIS — L820 Inflamed seborrheic keratosis: Secondary | ICD-10-CM | POA: Diagnosis not present

## 2023-03-28 DIAGNOSIS — D225 Melanocytic nevi of trunk: Secondary | ICD-10-CM | POA: Diagnosis not present

## 2023-03-28 DIAGNOSIS — D2262 Melanocytic nevi of left upper limb, including shoulder: Secondary | ICD-10-CM | POA: Diagnosis not present

## 2023-03-28 DIAGNOSIS — L661 Lichen planopilaris: Secondary | ICD-10-CM | POA: Diagnosis not present

## 2023-04-12 ENCOUNTER — Ambulatory Visit: Payer: Medicare Other | Admitting: Neurology

## 2023-04-12 ENCOUNTER — Encounter: Payer: Self-pay | Admitting: Neurology

## 2023-04-12 VITALS — BP 132/85 | HR 80 | Ht 67.75 in | Wt 161.5 lb

## 2023-04-12 DIAGNOSIS — R519 Headache, unspecified: Secondary | ICD-10-CM

## 2023-04-12 DIAGNOSIS — H539 Unspecified visual disturbance: Secondary | ICD-10-CM

## 2023-04-12 NOTE — Progress Notes (Signed)
ASSESSMENT AND PLAN 72 y.o. year old female  Chronic daily headaches,  MRI of the brain showed no significant abnormality,  Tried and failed different medications in the past, including Topamax, CGRP antagonist, Effexor,  Does not want to take any medication due to concern of side effect,  Previous elevated ESR C-reactive protein in the setting of UTI, will repeat ESR C-reactive protein to rule out inflammatory process,  Discussed with patient, wants to be evaluated by [facility] headache specialist, referral was put in   HISTORY  [name], is a 72 year old female seen in request by her primary care physician Dr. Laurann Montana, for evaluation of dizziness, headaches, initial evaluation was on December 23, 2021   I reviewed and summarized the referring note. PMHX. Hypothyroidism Depression,  Lumbar decompression surgery in 2013.   She had her first onset of dizziness on October 27, 2021, she stepped out of the car turning towards the left, then had sudden onset dizziness, felt her whole brain was in rotation, her neighbor was able to help drive her back home, she was able to walk into her house, symptoms persist and few hours later, she was evaluated at emergency room  Personally reviewed CT head without contrast, no acute intracranial abnormality noted   But her dizziness sensation, later light sensitivity headache persistent for few days, she was able to see by her ophthalmologist, there was no significant abnormality noted, her symptoms gradually improved after few days   She went to vestibular rehab in March 2023, overall has much improved  About a week ago, she began to experience similar sensation again, holoacranial headaches, unsteady sensation, extreme light sensitivity, neck muscle tension, also retro-orbital area pain,  She tried naproxen with limited help, also have nausea, she denies difficulty talking, using her arms and legs   She denies a history of typical  migraine in the past,  UPDATE August 6th 2024: Today she came in continue complains of daily headaches, sitting in room with lights off, feeling better in the bright sunshine, does not like fluorescent light,  She was given different medications in the past, including Emgality, Topamax, could not tolerate the medication for variable reasons, also worried about the side effect of the medicine, not want to take any medication,    Reviewed evaluation by [facility] ophthalmology associates Dr. Manning Charity on September 28, 2022, no signs of intraocular inflammation and showing, no acute anatomic pathology is observed to explain her symptoms  Laboratory evaluation at office visit in December 23, 2021 showed significantly elevated CRP to 9, ESR 48, ALT 50, UA showed large leukocyte many bacteria, positive for E. Coli. Repeat labs 12/28/21 CRP 100, ESR 102.  In the setting of UTI  REVIEW OF SYSTEMS: Out of a complete 14 system review of symptoms, the patient complains only of the following symptoms, and all other reviewed systems are negative.  See HPI  PHYSICAL EXAM  Vitals:   04/12/23 1018 04/12/23 1023  BP: (!) 144/84 132/85  Pulse: 80   Weight: 161 lb 8 oz (73.3 kg)   Height: 5' 7.75" (1.721 m)     Body mass index is 24.74 kg/m.  PHYSICAL EXAMNIATION:  Gen: NAD, conversant, well nourised, well groomed                     Cardiovascular: Regular rate rhythm, no peripheral edema, warm, nontender. Eyes: Conjunctivae clear without exudates or hemorrhage Neck: Supple, no carotid bruits. Pulmonary: Clear to auscultation bilaterally  NEUROLOGICAL EXAM:  MENTAL STATUS: Speech/cognition: Depressed looking elderly female, awake, alert oriented to history taking and casual conversation  CRANIAL NERVES: CN II: Visual fields are full to confrontation.  Pupils are round equal and briskly reactive to light.  Funduscopy examination showed sharp disc bilaterally CN III, IV, VI: extraocular  movement are normal. No ptosis. CN V: Facial sensation is intact to pinprick in all 3 divisions bilaterally. Corneal responses are intact.  CN VII: Face is symmetric with normal eye closure and smile. CN VIII: Hearing is normal to casual conversation CN IX, X: Palate elevates symmetrically. Phonation is normal. CN XI: Head turning and shoulder shrug are intact CN XII: Tongue is midline with normal movements and no atrophy.  MOTOR: There is no pronator drift of out-stretched arms. Muscle bulk and tone are normal. Muscle strength is normal.  REFLEXES: Reflexes are 2  and symmetric at the biceps, triceps, knees, and ankles. Plantar responses are flexor.  SENSORY: Intact to light touch, pinprick, positional and vibratory sensation are intact in fingers and toes.  COORDINATION: Rapid alternating movements and fine finger movements are intact. There is no dysmetria on finger-to-nose and heel-knee-shin.    GAIT/STANCE: Push-up to get up from seated position, cautious    ALLERGIES: Allergies  Allergen Reactions   Flagyl [Metronidazole] Anaphylaxis   Hydrocodone-Acetaminophen     Other reaction(s): Confusion, Confusion (intolerance)   Tramadol Nausea Only and Nausea And Vomiting   Alendronate     Other reaction(s): Other (See Comments)   Amoxicillin     rash   Azithromycin     Other reaction(s): Other (See Comments)   Buspar [Buspirone]     Heart palpations   Buspirone Hcl     Other reaction(s): Irregular Heart Rate   Celebrex [Celecoxib]     Chest pain   Cymbalta [Duloxetine Hcl]     depression   Duloxetine     Other reaction(s): Other (See Comments)   Emgality [Galcanezumab-Gnlm] Nausea And Vomiting   Estradiol     Other reaction(s): Other (See Comments)   Gabapentin    Hydroxychloroquine     Other reaction(s): Other (See Comments)   Meloxicam Nausea Only   Tizanidine Other (See Comments)    spinning   Topiramate Other (See Comments)    "Spinning" when taken with  tizanidine    HOME MEDICATIONS: Outpatient Medications Prior to Visit  Medication Sig Dispense Refill   diphenhydrAMINE (BENADRYL) 25 MG tablet Take 25 mg by mouth every 6 (six) hours as needed.     halobetasol (ULTRAVATE) 0.05 % cream Apply topically 2 (two) times daily.     Levothyroxine Sodium 137 MCG CAPS   1   baclofen (LIORESAL) 10 MG tablet Take 1 tablet (10 mg total) by mouth 3 (three) times daily as needed for muscle spasms. 60 each 0   divalproex (DEPAKOTE ER) 250 MG 24 hr tablet Take 1 tablet (250 mg total) by mouth at bedtime. 30 tablet 5   Galcanezumab-gnlm (EMGALITY) 120 MG/ML SOAJ Inject 120 mg into the skin every 30 (thirty) days. 1.12 mL 11   ondansetron (ZOFRAN) 8 MG tablet  (Patient not taking: Reported on 04/12/2023)     Ubrogepant (UBRELVY) 100 MG TABS Take 100 mg by mouth as needed (take 1 at onset of headache, max is 200 mg in 24 hours). (Patient not taking: Reported on 04/12/2023) 10 tablet 11   No facility-administered medications prior to visit.    PAST MEDICAL HISTORY: Past Medical History:  Diagnosis Date  Anxiety    Depression    Hypothyroidism    Mild CAD    cath 03/2022: 20% proximal RCA stenosis   Thyroid disease     PAST SURGICAL HISTORY: Past Surgical History:  Procedure Laterality Date   BACK SURGERY     BUNIONECTOMY Right    CESAREAN SECTION     LEFT HEART CATH AND CORONARY ANGIOGRAPHY N/A 03/15/2022   Procedure: LEFT HEART CATH AND CORONARY ANGIOGRAPHY;  Surgeon: Kathleene Hazel, MD;  Location: [facility];  Service: Cardiovascular;  Laterality: N/A;    FAMILY HISTORY: Family History  Problem Relation Age of Onset   Breast cancer Mother    Hashimoto's thyroiditis Mother    Arthritis Mother    Heart attack Father    CAD Father    Stroke Brother     SOCIAL HISTORY: Social History   Socioeconomic History   Marital status: Divorced    Spouse name: Not on file   Number of children: 3   Years of education: Not on file    Highest education level: Bachelor's degree (e.g., BA, AB, BS)  Occupational History   Not on file  Tobacco Use   Smoking status: Never   Smokeless tobacco: Never  Vaping Use   Vaping status: Never Used  Substance and Sexual Activity   Alcohol use: Not Currently   Drug use: No   Sexual activity: Not Currently  Other Topics Concern   Not on file  Social History Narrative   Lives alone    R handed   Caffeine: 3-4 c of Coffee a day   Social Determinants of Health   Financial Resource Strain: Not on file  Food Insecurity: Not on file  Transportation Needs: Not on file  Physical Activity: Not on file  Stress: Not on file  Social Connections: Not on file  Intimate Partner Violence: Not on file     DIAGNOSTIC DATA (LABS, IMAGING, TESTING) - I reviewed patient records, labs, notes, testing and imaging myself where available.  Lab Results  Component Value Date   WBC 6.8 03/15/2022   HGB 12.8 03/15/2022   HCT 37.2 03/15/2022   MCV 91.0 03/15/2022   PLT 261 03/15/2022      Component Value Date/Time   NA 142 03/15/2022 0507   NA 138 12/28/2021 1458   K 3.7 03/15/2022 0507   CL 110 03/15/2022 0507   CO2 24 03/15/2022 0507   GLUCOSE 102 (H) 03/15/2022 0507   BUN 8 03/15/2022 0507   BUN 12 12/28/2021 1458   CREATININE 0.69 03/15/2022 0507   CALCIUM 8.8 (L) 03/15/2022 0507   PROT 6.5 03/14/2022 1935   PROT 6.2 12/28/2021 1458   ALBUMIN 3.6 03/14/2022 1935   ALBUMIN 3.3 (L) 12/28/2021 1458   AST 23 03/14/2022 1935   ALT 18 03/14/2022 1935   ALKPHOS 76 03/14/2022 1935   BILITOT 0.5 03/14/2022 1935   BILITOT 0.4 12/28/2021 1458   GFRNONAA >60 03/15/2022 0507   GFRAA >90 08/15/2013 0400   Lab Results  Component Value Date   CHOL 193 03/15/2022   HDL 66 03/15/2022   LDLCALC 119 (H) 03/15/2022   TRIG 39 03/15/2022   CHOLHDL 2.9 03/15/2022   No results found for: "HGBA1C" No results found for: "VITAMINB12" Lab Results  Component Value Date   TSH 1.656  03/15/2022    Margie Ege, AGNP-C, DNP 04/12/2023, 10:30 AM [facility] [address] [place], [place] [address] [phone]

## 2023-04-13 ENCOUNTER — Telehealth: Payer: Self-pay | Admitting: Neurology

## 2023-04-13 NOTE — Telephone Encounter (Signed)
Referral for neurology fax to [facility]. Phone: [phone], Fax: [phone]

## 2023-06-23 DIAGNOSIS — Z124 Encounter for screening for malignant neoplasm of cervix: Secondary | ICD-10-CM | POA: Diagnosis not present

## 2023-06-23 DIAGNOSIS — Z6825 Body mass index (BMI) 25.0-25.9, adult: Secondary | ICD-10-CM | POA: Diagnosis not present

## 2023-06-23 DIAGNOSIS — R350 Frequency of micturition: Secondary | ICD-10-CM | POA: Diagnosis not present

## 2023-06-23 DIAGNOSIS — Z01419 Encounter for gynecological examination (general) (routine) without abnormal findings: Secondary | ICD-10-CM | POA: Diagnosis not present

## 2023-06-23 DIAGNOSIS — Z1231 Encounter for screening mammogram for malignant neoplasm of breast: Secondary | ICD-10-CM | POA: Diagnosis not present

## 2023-07-13 ENCOUNTER — Other Ambulatory Visit: Payer: Self-pay

## 2023-07-13 DIAGNOSIS — S29012A Strain of muscle and tendon of back wall of thorax, initial encounter: Secondary | ICD-10-CM

## 2023-07-13 NOTE — Progress Notes (Signed)
Pt called to discuss treatment plan for back pain. The trigger point injections only help for a day or so. Pain hasn't improved much and she would like to discuss referral to PM&R as Dr. Pearletha Forge had mentioned at her previous visit.  Referral placed.

## 2023-07-14 ENCOUNTER — Other Ambulatory Visit: Payer: Self-pay | Admitting: Neurology

## 2023-07-14 ENCOUNTER — Encounter: Payer: Self-pay | Admitting: Physical Medicine & Rehabilitation

## 2023-07-20 ENCOUNTER — Telehealth: Payer: Self-pay | Admitting: Neurology

## 2023-07-20 DIAGNOSIS — G43E09 Chronic migraine with aura, not intractable, without status migrainosus: Secondary | ICD-10-CM

## 2023-07-20 MED ORDER — UBRELVY 50 MG PO TABS: ORAL_TABLET | ORAL | 0 refills | Status: DC

## 2023-07-20 NOTE — Telephone Encounter (Signed)
Ubrogepant (UBRELVY) 50 MG TABS  0 ordered       Summary: Take 1 tab at onset of migraine. May repeat in 2 hrs, if needed. Max dose: 2 tabs/day. This is a 30 day prescription., Normal Start: 11/13/2024Ord/Sold: 07/20/2023 (O)Ordered On: 11/13/2024Pharmacy: [id number] - [place], [place] - [address]      I did not see ubrelvy in our system, I did Rx 10 tabs this time, she may ask her previous prescriber to refill ubrelvy for later prescription

## 2023-07-20 NOTE — Telephone Encounter (Signed)
Phone room: let pt know Dr. Terrace Arabia stated: I did not see [name] in our system, I did Rx 10 tabs this time, she may ask her previous prescriber to refill ubrelvy for later prescription

## 2023-07-20 NOTE — Telephone Encounter (Signed)
Pt called stating that she is needing a refill on her Ubrelvy. She states the pharmacy informed her that it ran out of refills.

## 2023-07-20 NOTE — Telephone Encounter (Signed)
Pt reported not taking at last office visit.           Last note stated: Discussed with patient, wants to be evaluated by [facility] headache specialist, referral was put in   How do you wish to proceed?

## 2023-08-02 ENCOUNTER — Encounter: Payer: Self-pay | Admitting: Family Medicine

## 2023-08-02 ENCOUNTER — Ambulatory Visit: Payer: Medicare Other | Admitting: Family Medicine

## 2023-08-02 VITALS — BP 122/78 | Ht 67.5 in | Wt 162.0 lb

## 2023-08-02 DIAGNOSIS — B0229 Other postherpetic nervous system involvement: Secondary | ICD-10-CM | POA: Diagnosis not present

## 2023-08-02 NOTE — Patient Instructions (Addendum)
Your current symptoms sound like could be related to a condition called Post-herpetic neuralgia (post-shingles nerve pain)  You may potentially be a candidate for medications such as: - Lyrica (aka: pregabalin) - as we discussed, this could cause dizziness and balance issues as well - Amitriptyline (although, as we discussed, could make you more unsteady and make your vertigo worse) - Lidocaine patches applied to the area  - You have had poor response to gabapentin & tramadol in the past, which we will sometimes use for post-herpetic neuralgia   Be sure to discuss this with the specialist when you see them.

## 2023-08-02 NOTE — Progress Notes (Signed)
DATE OF VISIT: 08/02/2023        [name] DOB: Nov 15, 1950 MRN: [id number]  CC:  f/u Lt-sided thoracic back pain  History of present Illness: [name] is a 72 y.o. female who presents for a follow-up visit for back pain.  Current flare x 4 weeks  - pain in the mid-back along the left of the spine - no specific injury/trauma Trying rest, heat, cold No regular medications for this - has tried muscle relaxants in the past, but not helping - cannot take NSAIDs - causes stomach irritation - no improvement OTC Lidocaine patches - no improvement with Voltaren gel Trying to stay active with walking daily - some days too difficult to walk due to pain Prior trigger point injections 12/2022 only helped for a few days Has needed to go to the ER in the past - neg lung and heart evaluations in the past Improved with rest  Remote hx of shingles in the same area as the pain - reports 2-3 episodes of shingles in the past in the same area -- all treated with antiviral medication - tried on Cymbalta in the past - made depression/mood worse - tried gabapentin in the past for foot pain -- unsure reaction to this   Given PM&R referral 07/13/23 - scheduled 08/26/23   Medications:  Outpatient Encounter Medications as of 08/02/2023  Medication Sig   diphenhydrAMINE (BENADRYL) 25 MG tablet Take 25 mg by mouth every 6 (six) hours as needed.   halobetasol (ULTRAVATE) 0.05 % cream Apply topically 2 (two) times daily.   Levothyroxine Sodium 137 MCG CAPS    Ubrogepant (UBRELVY) 50 MG TABS Take 1 tab at onset of migraine.  May repeat in 2 hrs, if needed.  Max dose: 2 tabs/day. This is a 30 day prescription.   No facility-administered encounter medications on file as of 08/02/2023.    Allergies: is allergic to flagyl [metronidazole], hydrocodone-acetaminophen, tramadol, alendronate, amoxicillin, azithromycin, buspar [buspirone], buspirone hcl, celebrex [celecoxib], cymbalta [duloxetine hcl],  duloxetine, emgality [galcanezumab-gnlm], estradiol, gabapentin, hydroxychloroquine, meloxicam, tizanidine, and topiramate.  Physical Examination: Vitals: BP 122/78   Ht 5' 7.5" (1.715 m)   Wt 162 lb (73.5 kg)   BMI 25.00 kg/m  GENERAL:  [name] is a 72 y.o. female appearing their stated age, alert and oriented x 3, in no apparent distress.  SKIN: no rashes or lesions, skin clean, dry, intact MSK: Thoracic spine with signs of slight scoliosis with elevation of the right shoulder.  No midline tenderness.  She has no paraspinal tenderness.  She does report pain along the left T7 area, but no abnormalities appreciated on exam in this area.  She has full flexion and extension without pain NEURO: sensation intact to light touch  Assessment & Plan Postherpetic neuralgia Acute on chronic left-sided thoracic back pain in the area of T7, reports remote history of shingles in that area, as well as recurrent shingles outbreak in the past.  She has had relatively unremarkable MRI of the thoracic spine and has not responded well to prior trigger point injections.  Suspect underlying cause of her symptoms is postherpetic neuralgia  Plan: -Reviewed treatment options including gabapentin, Lyrica, amitriptyline, lidocaine patches.  She has had known adverse reaction to gabapentin in the past, she does not recall what.  When discussing Lyrica and amitriptyline she did note history of balance issues, vertigo, dizziness.  Would be hesitant to prescribe either of these medications as they could increase her unsteadiness and predispose her  to fall.  Also amitriptyline is on the Beers List of potentially harmful medications with side effects that may outweigh the benefit. -She has not tried lidocaine patch recently, she does have some at home.  Recommend that she try this, as this is a preferred treatment for postherpetic neuralgia -She already has appointment scheduled with PM&R 08/26/2023 to discuss further  pain management treatments.  Advised that she can try to reach out to their office to see about potential cancellations -She will follow-up with [place] on an as-needed basis Patient expressed understanding & agreement with above.  Encounter Diagnosis  Name Primary?   Postherpetic neuralgia Yes    No orders of the defined types were placed in this encounter.

## 2023-08-09 ENCOUNTER — Ambulatory Visit: Payer: Medicare Other | Admitting: Obstetrics

## 2023-08-26 ENCOUNTER — Encounter: Payer: Medicare Other | Admitting: Physical Medicine & Rehabilitation

## 2023-09-15 ENCOUNTER — Encounter: Payer: Medicare Other | Attending: Physical Medicine & Rehabilitation | Admitting: Physical Medicine & Rehabilitation

## 2023-09-15 ENCOUNTER — Encounter: Payer: Self-pay | Admitting: Physical Medicine & Rehabilitation

## 2023-09-15 VITALS — BP 132/85 | HR 80 | Ht 67.5 in | Wt 167.0 lb

## 2023-09-15 DIAGNOSIS — B0229 Other postherpetic nervous system involvement: Secondary | ICD-10-CM | POA: Insufficient documentation

## 2023-09-15 NOTE — Patient Instructions (Signed)
Nerve Pain After Shingles Postherpetic neuralgia (PHN) is nerve pain you may get after you have shingles. Shingles is an infection that causes a painful rash and blisters. It's caused by the same germ that causes chickenpox. PHN affects the spot on your body where you had the shingles rash. It can last for 3 months after your rash has gone away. What are the causes? PHN may be caused by damage to your nerves. This damage may come from swelling from the shingles infection. What increases the risk? You may be more likely to get PHN if: You're older than 71 years of age. You have severe pain before your rash starts. You have a very bad rash. You have shingles in and around your eye. Your body defense system (immune system) is weak. What are the signs or symptoms? The main symptom of PHN is pain. The pain may: Be stabbing, burning, or shooting. Feel like an electric shock. Come and go, or it may be there all the time. Get worse if: Something touches your skin. The temperature goes up or down. You may also have itching. How is this diagnosed? PHN may be diagnosed based on: Your symptoms. Whether you've had shingles before. How is this treated? There's no cure, but treatment can help with the pain. Normal pain medicines may not help. You may need to work with an expert in treating pain to find what works best for you. Treatment may include: Anti-seizure medicines. Antidepressants. Strong pain medicines. A numbing patch worn on the skin. Shots of: Numbing medicines. Medicines to treat inflammation. Botulinum toxin. This can block pain signals and stop you from feeling pain. Follow these instructions at home: Medicines Take over-the-counter and prescription medicines only as told by your health care provider. Ask your provider if the medicine prescribed to you: Requires you to avoid driving or using machinery. Can cause trouble pooping or constipation. You may need to take these  actions to prevent or treat trouble pooping: Drink enough fluid to keep your pee (urine) pale yellow. Take over-the-counter or prescription medicines. Eat foods that are high in fiber, such as beans, whole grains, and fresh fruits and vegetables. Limit foods that are high in fat and processed sugars, such as fried or sweet foods. Managing pain  If told, put ice on the painful area. Put ice in a plastic bag. Place a towel between your skin and the bag. Leave the ice on for 20 minutes, 2-3 times a day. If your skin turns bright red, remove the ice right away to prevent skin damage. The risk of damage is higher if you can't feel pain, heat, or cold. Cover sensitive spots with a bandage, or dressing, to stop clothes from rubbing. Wear loose clothes. General instructions It may take a long time for you to get better. Work closely with your provider. Think about talking with a mental health care provider. They can help you find ways to cope with feeling overwhelmed or hopeless. Have a good support system at home. Think about joining a pain support group. How is this prevented? Vaccines are the best way to prevent shingles and PHN. You should get the vaccine shot for shingles once you're older than 71 years of age. Talk with your provider about getting the shot. Contact a health care provider if: Your medicine isn't helping. You can't manage your pain at home. You feel sad or depressed. Get help right away if: You have thoughts about hurting yourself or others. Get help right away if  you feel like you may hurt yourself or others, or have thoughts about taking your own life. Go to your nearest emergency room or: Call 911. Call the National Suicide Prevention Lifeline at (414)038-7354 or 988. This is open 24 hours a day. Text the Crisis Text Line at (340) 829-8385. This information is not intended to replace advice given to you by your health care provider. Make sure you discuss any questions you have  with your health care provider. Document Revised: 11/25/2022 Document Reviewed: 11/25/2022 Elsevier Patient Education  2024 [employer].

## 2023-09-15 NOTE — Progress Notes (Signed)
 Subjective:    Patient ID: Inocente KATHEE Servant, female    DOB: Feb 14, 1951, 73 y.o.   MRN: [id number]  HPI 73 yo female referred by Sports Med to evaluate rib pain  Left bra strap pain  Hx of shingles in same area 6-7 yrs ago, the patient states that her pain started around that time.  The patient is retired she is independent with all self-care and mobility.  She drives she is able to go up and down steps.  She states that during flareups which occur for no apparent reason, she has severe pain.  Has tried dry needling, intercostal PT, Spine PT, Chiropractic,  Gabapentin  made her feel suicidal Could not tolerate duloxetine Has not tried pregabalin  Lidocaine  topical helps a little during flareups Has not tried capsaicin  cream  She rarely uses naproxen Does YTW exercise as well as other low back and upper back stretching exercises twice a day   Pain Inventory Average Pain 8 Pain Right Now 8 My pain is sharp and stabbing  In the last 24 hours, has pain interfered with the following? General activity 10 Relation with others 10 Enjoyment of life 10 What TIME of day is your pain at its worst? morning , daytime, evening, and night Sleep (in general) Fair  Pain is worse with: walking, bending, sitting, and standing Pain improves with: heat/ice Relief from Meds:  none at this time  walk without assistance ability to climb steps?  yes do you drive?  yes Do you have any goals in this area?  yes  retired  bladder control problems tingling dizziness  Any changes since last visit?  no  Any changes since last visit?  no    Family History  Problem Relation Age of Onset   Breast cancer Mother    Hashimoto's thyroiditis Mother    Arthritis Mother    Heart attack Father    CAD Father    Stroke Brother    Social History   Socioeconomic History   Marital status: Divorced    Spouse name: Not on file   Number of children: 3   Years of education: Not on file    Highest education level: Bachelor's degree (e.g., BA, AB, BS)  Occupational History   Not on file  Tobacco Use   Smoking status: Never   Smokeless tobacco: Never  Vaping Use   Vaping status: Never Used  Substance and Sexual Activity   Alcohol use: Not Currently   Drug use: No   Sexual activity: Not Currently  Other Topics Concern   Not on file  Social History Narrative   Lives alone    R handed   Caffeine : 3-4 c of Coffee a day   Social Drivers of Corporate Investment Banker Strain: Not on file  Food Insecurity: Not on file  Transportation Needs: Not on file  Physical Activity: Not on file  Stress: Not on file  Social Connections: Not on file   Past Surgical History:  Procedure Laterality Date   BACK SURGERY     BUNIONECTOMY Right    CESAREAN SECTION     LEFT HEART CATH AND CORONARY ANGIOGRAPHY N/A 03/15/2022   Procedure: LEFT HEART CATH AND CORONARY ANGIOGRAPHY;  Surgeon: Verlin Lonni BIRCH, MD;  Location: [facility];  Service: Cardiovascular;  Laterality: N/A;   Past Medical History:  Diagnosis Date   Anxiety    Depression    Hypothyroidism    Mild CAD    cath 03/2022: 20%  proximal RCA stenosis   Thyroid  disease    BP 132/85   Pulse 80   Ht 5' 7.5 (1.715 m)   Wt 167 lb (75.8 kg)   SpO2 95%   BMI 25.77 kg/m   Opioid Risk Score:   Fall Risk Score:  `1  Depression screen [facility] 2/9     09/15/2023   11:20 AM  Depression screen [facility] 2/9  Decreased Interest 0  Down, Depressed, Hopeless 0  PHQ - 2 Score 0      Review of Systems  Musculoskeletal:  Positive for back pain and neck pain.  All other systems reviewed and are negative.     Objective:   Physical Exam  General No acute distress Mood and affect appropriate Extremities without edema Sensation intact no hypersensitivity to touch the rib area MSK normal cervical thoracic and lumbar spine range of motion Tenderness to palpation along the ninth 10th and 11th rib from posterior  lateral aspect to anterior lateral aspect. Skin no evidence of scarring in the left thoracic area.  Motor strength is 5/5 bilateral hip flexor knee extensor ankle dorsiflexors Ambulates without assistive device no evidence of toe drag or knee instability      Assessment & Plan:   1.  Chronic left-sided T9-T10 and T11 dermatomal pain.  Most likely etiology would be postherpetic neuralgia.  There is no scarring along the dermatome however she does give a history of shingles in that same area.  We discussed that even after acute infection has subsided there is latent a virus in the dorsal root ganglia and flareups may occur when the body is stressed by other types of illness, lack of sleep etc We discussed treatment options including a trial of pregabalin  which she is hesitant to do secondary to her previous experience with gabapentin  We discussed Qutenza , 8% patch which has an FDA indication for postherpetic neuralgia.  She would like to pursue this option as a less invasive procedure. We also discussed intercostal nerve block she would need 3 levels to be done under ultrasound guidance.  Would first like to see how the Qutenza  patch helps prior to scheduling.  We discussed that is difficult to predict the duration of relief with intercostal nerve blocks.  We discussed cryoablation as well as radiofrequency neurotomy as potential longer-lasting procedures.

## 2023-09-19 ENCOUNTER — Telehealth: Payer: Self-pay

## 2023-09-19 DIAGNOSIS — B0229 Other postherpetic nervous system involvement: Secondary | ICD-10-CM

## 2023-09-19 MED ORDER — QUTENZA (2 PATCH) 8 % EX KIT
2.0000 | PACK | Freq: Once | CUTANEOUS | 0 refills | Status: AC
Start: 2023-10-12 — End: 2023-10-12

## 2023-09-19 NOTE — Telephone Encounter (Signed)
 Patches sent to specialty pharmacy

## 2023-09-28 ENCOUNTER — Telehealth: Payer: Self-pay

## 2023-09-28 NOTE — Telephone Encounter (Signed)
PA for qutenza patches sent to special pharmacy

## 2023-10-05 ENCOUNTER — Encounter: Payer: Self-pay | Admitting: Obstetrics and Gynecology

## 2023-10-05 ENCOUNTER — Ambulatory Visit: Payer: Medicare Other | Admitting: Obstetrics and Gynecology

## 2023-10-05 VITALS — BP 133/85 | HR 77 | Ht 67.0 in | Wt 168.0 lb

## 2023-10-05 DIAGNOSIS — N393 Stress incontinence (female) (male): Secondary | ICD-10-CM | POA: Insufficient documentation

## 2023-10-05 DIAGNOSIS — R351 Nocturia: Secondary | ICD-10-CM

## 2023-10-05 DIAGNOSIS — R350 Frequency of micturition: Secondary | ICD-10-CM | POA: Diagnosis not present

## 2023-10-05 DIAGNOSIS — N8110 Cystocele, unspecified: Secondary | ICD-10-CM | POA: Diagnosis not present

## 2023-10-05 LAB — POCT URINALYSIS DIPSTICK
Bilirubin, UA: NEGATIVE
Blood, UA: NEGATIVE
Glucose, UA: NEGATIVE
Ketones, UA: NEGATIVE
Leukocytes, UA: NEGATIVE
Nitrite, UA: NEGATIVE
Protein, UA: NEGATIVE
Spec Grav, UA: 1.015 (ref 1.010–1.025)
Urobilinogen, UA: 0.2 U/dL
pH, UA: 5.5 (ref 5.0–8.0)

## 2023-10-05 NOTE — Assessment & Plan Note (Signed)
-   Stage II anterior, Stage I posterior, Stage I apical prolapse - For treatment of pelvic organ prolapse, we discussed options for management including expectant management, conservative management, and surgical management, such as Kegels, a pessary, pelvic floor physical therapy, and specific surgical procedures. - She is interested in surgery. Would recommend anterior repair. On exam today, minimal apical descent but we did discuss need for possible sacrospinous ligament fixation as well if apex was more descended intraoperatively.

## 2023-10-05 NOTE — Assessment & Plan Note (Addendum)
-   Unclear if patient has overactive bladder or issues with bladder emptying due to prolapse as she describes difficulty emptying.  - Will assess emptying further with urodynamics. We discussed possibility of starting medication for OAB.  - Discussed avoiding irritative beverages but she uses caffeine to help prevent migraines

## 2023-10-05 NOTE — Patient Instructions (Addendum)
Today we talked about ways to manage bladder urgency such as altering your diet to avoid irritative beverages and foods (bladder diet) as well as attempting to decrease stress and other exacerbating factors.    The Most Bothersome Foods* The Least Bothersome Foods*  Coffee - Regular & Decaf Tea - caffeinated Carbonated beverages - cola, non-colas, diet & caffeine-free Alcohols - Beer, Red Wine, White Wine, [address] - Grapefruit, [place], Orange, [employer] - Cranberry, Grapefruit, Orange, Pineapple Vegetables - Tomato & Tomato Products Flavor Enhancers - Hot peppers, Spicy foods, Chili, Horseradish, Vinegar, Monosodium glutamate (MSG) Artificial Sweeteners - NutraSweet, Sweet 'N Low, Equal (sweetener), Saccharin Ethnic foods - [place], [place], [place] food [employer] - low-fat & whole Fruits - Bananas, Blueberries, Honeydew melon, Pears, Raisins, Watermelon Vegetables - Broccoli, [address] Sprouts, [place], Carrots, Cauliflower, Crane, Cucumber, Mushrooms, Peas, Radishes, Squash, Zucchini, White potatoes, Sweet potatoes & yams Poultry - Chicken, Eggs, [place], [employer] - Beef, Diplomatic Services operational officer, Lamb Seafood - Shrimp, Cutler fish, Salmon Grains - Oat, Rice Snacks - Pretzels, Popcorn  *Lenward Chancellor et al. Diet and its role in interstitial cystitis/bladder pain syndrome (IC/BPS) and comorbid conditions. [employer]. BJU Int. 2012 Jan 11.    URODYNAMICS (UDS) TEST INFORMATION  IMPORTANT: Please try to arrive with a comfortably full bladder!   What is UDS? Urodynamics is a bladder test used to evaluate how your bladder and urethra (tube you urinate out of) work to help find out the cause of your bladder symptoms and evaluate your bladder function in order to make the best treatment plan for you.   What to expect? A nurse will perform the test and will be with you during the entire exam. First we will have to empty your bladder on a special toilet.  After you have emptied your bladder,  very small catheters (plastic tubing) will be placed into your bladder and into your vagina (or rectum). These special small catheters measure pressure to help measure your bladder function.  Your bladder will be gently filled with water and you will be asked to cough and strain at several different points during the test.   You will then be asked to empty your bladder in the special toilet with the catheters in place. Most patients can urinate (pee) easily with the catheters in place since the catheters are so small. In total this procedure lasts about 45 minutes to 1 hour.  After your test is completed, you will return (or possibly be seen the same day) to review the results, talk about treatment options and make a plan moving forward.

## 2023-10-05 NOTE — Assessment & Plan Note (Signed)
-   For treatment of stress urinary incontinence,  non-surgical options include expectant management, weight loss, physical therapy, as well as a pessary.  Surgical options include a midurethral sling, Burch urethropexy, and transurethral injection of a bulking agent. - She would be interested in treatment. Will have her return for urodynamic testing to confirm
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
## 2023-10-05 NOTE — Assessment & Plan Note (Signed)
-   Avoid caffeine in afternoons and drinking 2-3 hours prior to bedtime.

## 2023-10-11 ENCOUNTER — Encounter: Payer: Medicare Other | Admitting: Physical Medicine & Rehabilitation

## 2023-10-12 ENCOUNTER — Ambulatory Visit: Payer: Medicare Other | Admitting: Obstetrics and Gynecology

## 2023-10-12 VITALS — BP 132/82 | HR 66

## 2023-10-12 DIAGNOSIS — R948 Abnormal results of function studies of other organs and systems: Secondary | ICD-10-CM | POA: Diagnosis not present

## 2023-10-12 DIAGNOSIS — N393 Stress incontinence (female) (male): Secondary | ICD-10-CM

## 2023-10-12 DIAGNOSIS — N3281 Overactive bladder: Secondary | ICD-10-CM

## 2023-10-12 DIAGNOSIS — R350 Frequency of micturition: Secondary | ICD-10-CM

## 2023-10-12 NOTE — Progress Notes (Signed)
 [facility] Urodynamics Procedure  Referring Physician: Teresa Channel, MD Date of Procedure: 10/12/2023  [name] is a 73 y.o. female who presents for urodynamic evaluation. Indication(s) for study: SUI and OAB  Vital Signs: BP 132/82   Pulse 66   Laboratory Results: A catheterized urine specimen revealed:  Lab Results  Component Value Date   COLORU Yellow 10/05/2023   CLARITYU Clear 10/05/2023   GLUCOSEUR Negative 10/05/2023   BILIRUBINUR Negative 10/05/2023   KETONESU Negative 10/05/2023   SPECGRAV 1.015 10/05/2023   RBCUR Negative 10/05/2023   PHUR 5.5 10/05/2023   PROTEINUR Negative 10/05/2023   UROBILINOGEN 0.2 10/05/2023   LEUKOCYTESUR Negative 10/05/2023      Voiding Diary: Deferred   Procedure Timeout:  The correct patient was verified and the correct procedure was verified. The patient was in the correct position and safety precautions were reviewed based on at the patient's history.  Urodynamic Procedure A 39F dual lumen urodynamics catheter was placed under sterile conditions into the patient's bladder. A 39F catheter was placed into the rectum in order to measure abdominal pressure. EMG patches were placed in the appropriate position.  All connections were confirmed and calibrations/adjusted made. Saline was instilled into the bladder through the dual lumen catheters.  Cough/valsalva pressures were measured periodically during filling.  Patient was allowed to void.  The bladder was then emptied of its residual.  UROFLOW: Revealed a Qmax of 34.7 mL/sec.  She voided 199 mL and had a residual of 60 mL.  It was a intermittent pattern and represented normal habits though interpretation limited due to low voided volume.  CMG: This was performed with sterile water  in the sitting position at a fill rate of 10-30 mL/min.    First sensation of fullness was 51 mLs,  First urge was 66 mLs,  Strong urge was 176 mLs and  Capacity was 462 mLs  Stress  incontinence was demonstrated Highest negative Barrier CLPP was 106 cmH20 at 200 ml. Highest positive Barrier VLPP was 70 cmH20 at 200 ml.  Detrusor function was overactive, with phasic contractions seen.  The first occurred at 47 mL to 4.5 cm of water  and was associated with urge.  Compliance:  Normal. End fill detrusor pressure was 0cmH20.    UPP: MUCP with barrier reduction was 66 cm of water .    MICTURITION STUDY: Voiding was performed with reduction using scopettes in the sitting position.  Pdet at Qmax was 17.2 cm of water .  Qmax was 13.1 mL/sec.  It was a interrupted pattern, but the main void was prolonged but normal. After her initial void she pushed at the end resulting in the interrupted void.  She voided 237 mL and had a residual of 225 mL.  It was a volitional void, sustained detrusor contraction was present and abdominal straining was present at the end of the void.   EMG: This was performed with patches.  She had voluntary contractions, recruitment with fill was present and urethral sphincter was not relaxed with void.  The details of the procedure with the study tracings have been scanned into EPIC.   Urodynamic Impression:  1. Sensation was increased; capacity was normal 2. Stress Incontinence was demonstrated at normal pressures; 3. Detrusor Overactivity was demonstrated without leakage. 4. Emptying was dysfunctional with a elevated PVR (225 with micturition study) (Initial uroflow had a PVR of 60ml), a sustained detrusor contraction present,  abdominal straining present at the end of the void, dyssynergic urethral sphincter activity on EMG.  Plan: - The patient will follow up  to discuss the findings and treatment options.

## 2023-10-13 ENCOUNTER — Encounter: Payer: Medicare Other | Attending: Physical Medicine & Rehabilitation | Admitting: Physical Medicine & Rehabilitation

## 2023-10-13 ENCOUNTER — Encounter: Payer: Self-pay | Admitting: Physical Medicine & Rehabilitation

## 2023-10-13 ENCOUNTER — Telehealth: Payer: Self-pay | Admitting: Physical Medicine & Rehabilitation

## 2023-10-13 VITALS — BP 131/84 | HR 70 | Ht 67.0 in | Wt 167.0 lb

## 2023-10-13 DIAGNOSIS — B0229 Other postherpetic nervous system involvement: Secondary | ICD-10-CM | POA: Insufficient documentation

## 2023-10-13 LAB — POCT URINALYSIS DIPSTICK
Bilirubin, UA: NEGATIVE
Blood, UA: NEGATIVE
Glucose, UA: NEGATIVE
Ketones, UA: NEGATIVE
Leukocytes, UA: NEGATIVE
Nitrite, UA: NEGATIVE
Protein, UA: NEGATIVE
Spec Grav, UA: 1.01 (ref 1.010–1.025)
Urobilinogen, UA: 0.2 U/dL
pH, UA: 7 (ref 5.0–8.0)

## 2023-10-13 MED ORDER — PREGABALIN 25 MG PO CAPS: 25.0000 mg | ORAL_CAPSULE | Freq: Two times a day (BID) | ORAL | 2 refills | Status: DC

## 2023-10-13 NOTE — Telephone Encounter (Signed)
 Needs to speak to someone regarding script sent over.

## 2023-10-13 NOTE — Progress Notes (Signed)
 Subjective:    Patient ID: [name], female    DOB: 1951-06-03, 73 y.o.   MRN: [id number] Hx of shingles left lower posterior lateral rib area 6-7 yrs ago, the patient states that her pain started around that time.  The patient is retired she is independent with all self-care and mobility.  She drives she is able to go up and down steps.  She states that during flareups which occur for no apparent reason, she has severe pain.  Has tried dry needling, intercostal PT, Spine PT, Chiropractic,  Gabapentin  made her feel suicidal Could not tolerate duloxetine Has not tried pregabalin  Lidocaine  topical helps a little during flareups Has not tried capsaicin  cream  She rarely uses naproxen Does YTW exercise as well as other low back and upper back stretching exercises twice a day TENs unit not helpful  HPI Left-sided rib pain is intermittent she has good days and bad days. Lidocaine  topical helps marginally Tried Salon pas with capsaicin  which has not been helpful.  Has vertigo and nausea on a daily basis and is concerned that other medications may make this worse.  The patient has had difficulty tolerating TENS unit in the past Pain Inventory Average Pain 7 Pain Right Now 6 My pain is  piercing  In the last 24 hours, has pain interfered with the following? General activity 10 Relation with others 0 Enjoyment of life 10 What TIME of day is your pain at its worst? morning , daytime, evening, and night Sleep (in general) Fair  Pain is worse with:  moving and breathing Pain improves with: rest and heat/ice Relief from Meds:  na  Family History  Problem Relation Age of Onset   Breast cancer Mother    Hashimoto's thyroiditis Mother    Arthritis Mother    Heart attack Father    CAD Father    Stroke Brother    Social History   Socioeconomic History   Marital status: Divorced    Spouse name: Not on file   Number of children: 3   Years of education: Not on file    Highest education level: Bachelor's degree (e.g., BA, AB, BS)  Occupational History   Not on file  Tobacco Use   Smoking status: Never   Smokeless tobacco: Never  Vaping Use   Vaping status: Never Used  Substance and Sexual Activity   Alcohol use: Not Currently   Drug use: No   Sexual activity: Not Currently  Other Topics Concern   Not on file  Social History Narrative   Lives alone    R handed   Caffeine : 3-4 c of Coffee a day   Social Drivers of Corporate Investment Banker Strain: Not on file  Food Insecurity: Not on file  Transportation Needs: Not on file  Physical Activity: Not on file  Stress: Not on file  Social Connections: Not on file   Past Surgical History:  Procedure Laterality Date   BACK SURGERY     BUNIONECTOMY Right    CESAREAN SECTION     LEFT HEART CATH AND CORONARY ANGIOGRAPHY N/A 03/15/2022   Procedure: LEFT HEART CATH AND CORONARY ANGIOGRAPHY;  Surgeon: Verlin Lonni BIRCH, MD;  Location: [facility];  Service: Cardiovascular;  Laterality: N/A;   Past Surgical History:  Procedure Laterality Date   BACK SURGERY     BUNIONECTOMY Right    CESAREAN SECTION     LEFT HEART CATH AND CORONARY ANGIOGRAPHY N/A 03/15/2022   Procedure:  LEFT HEART CATH AND CORONARY ANGIOGRAPHY;  Surgeon: Verlin Lonni BIRCH, MD;  Location: [facility];  Service: Cardiovascular;  Laterality: N/A;   Past Medical History:  Diagnosis Date   Anxiety    Depression    Hypothyroidism    Mild CAD    cath 03/2022: 20% proximal RCA stenosis   Thyroid  disease    BP 131/84   Pulse 70   Ht 5' 7 (1.702 m)   Wt 167 lb (75.8 kg)   SpO2 99%   BMI 26.16 kg/m   Opioid Risk Score:   Fall Risk Score:  `1  Depression screen [facility] 2/9     09/15/2023   11:20 AM  Depression screen [facility] 2/9  Decreased Interest 0  Down, Depressed, Hopeless 0  PHQ - 2 Score 0     Review of Systems  Musculoskeletal:  Positive for back pain.       Spasms  All other systems reviewed  and are negative.     Objective:   Physical Exam  No acute distress Mood and affect are appropriate Ambulates without assist device no evidence of toe drag or knee instability She has tenderness to palpation along the thoracic paraspinals and along the ninth 10th and 11th ribs on the left side. No swelling in that area Thoracic range of motion is normal      Assessment & Plan:  1.  Postherpetic neuralgia left T9-T10-T11 posterior lateral area.  She has pain that fluctuates day-to-day.  We discussed that this makes it difficult to schedule a diagnostic block.  If it is scheduled on the day where she is not having any significant pain it would not be helpful.  We also discussed that diagnostic blocks with lidocaine  would be just 1 step in assessing potential for longer-lasting procedures such as cryoablation or radiofrequency ablation.  At this point I do think it is reasonable to hold off on this unless pain becomes constant and daily.  2.  We discussed other alternative treatments.  I think she would be an ideal candidate for Qutenza  patch however she has not paid her deductible for the year and her out-of-pocket expense at this time would be approximately $1000 We discussed pregabalin  low-dose 25 mg twice daily which she will try.  We discussed the most common side effects which are basically drowsiness and lower extremity swelling.  He would be unusual at a low dose to have these side effects I have given her some additional information. We discussed potential role of acupuncture, my concern would be with electroacupuncture that she would have poor tolerance like she did with TENS unit  Greater than half of the 30-minute visit was spent counseling coronation of care going over treatment options

## 2023-10-13 NOTE — Patient Instructions (Addendum)
 Pregabalin Capsules What is this medication? PREGABALIN (pre GAB a lin) treats nerve pain. It may also be used to prevent and control seizures in people with epilepsy. It works by calming overactive nerves in your body. This medicine may be used for other purposes; ask your health care provider or pharmacist if you have questions. COMMON BRAND NAME(S): Lyrica What should I tell my care team before I take this medication? They need to know if you have any of these conditions: Heart failure Kidney disease Lung disease Substance use disorder Suicidal thoughts, plans or attempt by you or a family member An unusual or allergic reaction to pregabalin, other medications, foods, dyes, or preservatives Pregnant or trying to get pregnant Breast-feeding How should I use this medication? Take this medication by mouth with water. Take it as directed on the prescription label at the same time every day. You can take it with or without food. If it upsets your stomach, take it with food. Keep taking it unless your care team tells you to stop. A special MedGuide will be given to you by the pharmacist with each prescription and refill. Be sure to read this information carefully each time. Talk to your care team about the use of this medication in children. While it may be prescribed for children as young as 1 month for selected conditions, precautions do apply. Overdosage: If you think you have taken too much of this medicine contact a poison control center or emergency room at once. NOTE: This medicine is only for you. Do not share this medicine with others. What if I miss a dose? If you miss a dose, take it as soon as you can. If it is almost time for your next dose, take only that dose. Do not take double or extra doses. What may interact with this medication? This medication may interact with the following: Alcohol Antihistamines for allergy, cough, and cold Certain medications for anxiety or  sleep Certain medications for blood pressure, heart disease Certain medications for depression like amitriptyline, fluoxetine, sertraline Certain medications for diabetes, like pioglitazone, rosiglitazone Certain medications for seizures like phenobarbital, primidone General anesthetics like halothane, isoflurane, methoxyflurane, propofol Medications that relax muscles for surgery Opioid medications for pain Phenothiazines like chlorpromazine, mesoridazine, prochlorperazine, thioridazine This list may not describe all possible interactions. Give your health care provider a list of all the medicines, herbs, non-prescription drugs, or dietary supplements you use. Also tell them if you smoke, drink alcohol, or use illegal drugs. Some items may interact with your medicine. What should I watch for while using this medication? Visit your care team for regular checks on your progress. Tell your care team if your symptoms do not start to get better or if they get worse. Do not suddenly stop taking this medication. You may develop a severe reaction. Your care team will tell you how much medication to take. If your care team wants you to stop the medication, the dose may be slowly lowered over time to avoid any side effects. This medication may affect your coordination, reaction time, or judgment. Do not drive or operate machinery until you know how this medication affects you. Sit up or stand slowly to reduce the risk of dizzy or fainting spells. Drinking alcohol with this medication can increase the risk of these side effects. If you or your family notice any changes in your behavior, such as new or worsening depression, thoughts of harming yourself, anxiety, other unusual or disturbing thoughts, or memory loss, call your  care team right away. Wear a medical ID bracelet or chain if you are taking this medication for seizures. Carry a card that describes your condition. List the medications and doses you take  on the card. This medication may make it more difficult to father a child. Talk to your care team if you are concerned about your fertility. What side effects may I notice from receiving this medication? Side effects that you should report to your care team as soon as possible: Allergic reactions or angioedema--skin rash, itching, hives, swelling of the face, eyes, lips, tongue, arms, or legs, trouble swallowing or breathing Blurry vision Thoughts of suicide or self-harm, worsening mood, feelings of depression Trouble breathing Side effects that usually do not require medical attention (report to your care team if they continue or are bothersome): Dizziness Drowsiness Dry mouth Nausea Swelling of the ankles, feet, hands Vomiting Weight gain This list may not describe all possible side effects. Call your doctor for medical advice about side effects. You may report side effects to FDA at 1-800-FDA-1088. Where should I keep my medication? Keep out of the reach of children and pets. This medication can be abused. Keep it in a safe place to protect it from theft. Do not share it with anyone. It is only for you. Selling or giving away this medication is dangerous and against the law. Store at [employer] C (77 degrees F). Get rid of any unused medication after the expiration date. This medication may cause harm and death if it is taken by other adults, children, or pets. It is important to get rid of the medication as soon as you no longer need it, or it is expired. You can do this in two ways: Take the medication to a medication take-back program. Check with your pharmacy or law enforcement to find a location. If you cannot return the medication, check the label or package insert to see if the medication should be thrown out in the garbage or flushed down the toilet. If you are not sure, ask your care team. If it is safe to put it in the trash, take the medication out of the container. Mix the  medication with cat litter, dirt, coffee grounds, or other unwanted substance. Seal the mixture in a bag or container. Put it in the trash. NOTE: This sheet is a summary. It may not cover all possible information. If you have questions about this medicine, talk to your doctor, pharmacist, or health care provider.  2024 Elsevier/Gold Standard (2021-05-26 00:00:00)

## 2023-10-14 ENCOUNTER — Encounter: Payer: Self-pay | Admitting: Physical Medicine & Rehabilitation

## 2023-10-17 ENCOUNTER — Telehealth: Payer: Self-pay | Admitting: Physical Medicine & Rehabilitation

## 2023-10-17 NOTE — Telephone Encounter (Signed)
 Patient has stopped lyrica , patient was advised of provider feedback and request.At this time she opted to not schedule for qutenza  given the out of pocket costs.   She has canceled future appts.

## 2023-10-21 NOTE — Telephone Encounter (Signed)
Per pharmacy task is completed. No further information is needed.

## 2023-11-03 ENCOUNTER — Encounter: Payer: Self-pay | Admitting: Obstetrics and Gynecology

## 2023-11-03 ENCOUNTER — Ambulatory Visit: Payer: Medicare Other | Admitting: Obstetrics and Gynecology

## 2023-11-03 VITALS — BP 128/80

## 2023-11-03 DIAGNOSIS — N8110 Cystocele, unspecified: Secondary | ICD-10-CM | POA: Diagnosis not present

## 2023-11-03 DIAGNOSIS — N393 Stress incontinence (female) (male): Secondary | ICD-10-CM

## 2023-11-03 DIAGNOSIS — R339 Retention of urine, unspecified: Secondary | ICD-10-CM

## 2023-11-03 DIAGNOSIS — Z01818 Encounter for other preprocedural examination: Secondary | ICD-10-CM | POA: Diagnosis not present

## 2023-11-03 NOTE — Progress Notes (Signed)
 [facility] Return Visit  SUBJECTIVE  History of Present Illness: [name] is a 73 y.o. female seen in follow-up for prolapse and incontinence. She underwent urodynamic testing.   Urodynamic Impression:  1. Sensation was increased; capacity was normal 2. Stress Incontinence was demonstrated at normal pressures; 3. Detrusor Overactivity was demonstrated without leakage. 4. Emptying was dysfunctional with a elevated PVR (225 with micturition study) (Initial uroflow had a PVR of 60ml), a sustained detrusor contraction present,  abdominal straining present at the end of the void, dyssynergic urethral sphincter activity on EMG.  Past Medical History: Patient  has a past medical history of Anxiety, Depression, Hypothyroidism, Mild CAD, and Thyroid disease.   Past Surgical History: She  has a past surgical history that includes Cesarean section; Back surgery; Bunionectomy (Right); and LEFT HEART CATH AND CORONARY ANGIOGRAPHY (N/A, 03/15/2022).   Medications: She has a current medication list which includes the following prescription(s): halobetasol, levothyroxine sodium, ondansetron, pregabalin, and ubrelvy.   Allergies: Patient is allergic to flagyl [metronidazole], hydrocodone-acetaminophen, tramadol, alendronate, amoxicillin, azithromycin, buspar [buspirone], buspirone hcl, celebrex [celecoxib], cymbalta [duloxetine hcl], duloxetine, emgality [galcanezumab-gnlm], estradiol, gabapentin, hydroxychloroquine, meloxicam, tizanidine, and topiramate.   Social History: Patient  reports that she has never smoked. She has never used smokeless tobacco. She reports that she does not currently use alcohol. She reports that she does not use drugs.     OBJECTIVE     Physical Exam: Vitals:   11/03/23 1115  BP: 128/80   Gen: No apparent distress, A&O x 3.  Detailed Urogynecologic Evaluation:  Deferred. Prior exam showed:  POP-Q   -0.5                                             Aa   -0.5                                           Ba   -6.5                                              C    1.5                                            Gh   3.5                                            Pb   9                                            tvl    -2  Ap   -2                                            Bp   -9                                              D      ASSESSMENT AND PLAN    [name] is a 73 y.o. with:  1. Prolapse of anterior vaginal wall   2. SUI (stress urinary incontinence, female)   3. Incomplete bladder emptying     Plan for surgery: Exam under anesthesia, anterior repair, possible sacrospinous fixation, cystoscopy.   - We reviewed the patient's specific anatomic and functional findings, with the assistance of diagrams, and together finalized the above procedure. The planned surgical procedures were discussed along with the surgical risks outlined below, which were also provided on a detailed handout. Additional treatment options including expectant management, conservative management, medical management were discussed where appropriate.  We reviewed the benefits and risks of each treatment option.   - We discussed that due to her incomplete bladder emptying, then would recommend interval incontinence procedure. We reviewed options for SUI including sling and urethral bulking. She prefers urethral bulking in the office if needed. She prefers to avoid mesh as her sister is currently undergoing some complications with previously placed mesh.   General Surgical Risks: For all procedures, there are risks of bleeding, infection, damage to surrounding organs including but not limited to bowel, bladder, blood vessels, ureters and nerves, and need for further surgery if an injury were to occur. These risks are all low with minimally invasive surgery.   There are risks of numbness and weakness at any body  site or buttock/rectal pain.  It is possible that baseline pain can be worsened by surgery, either with or without mesh. If surgery is vaginal, there is also a low risk of possible conversion to laparoscopy or open abdominal incision where indicated. Very rare risks include blood transfusion, blood clot, heart attack, pneumonia, or death.   There is also a risk of short-term postoperative urinary retention with need to use a catheter. About half of patients need to go home from surgery with a catheter, which is then later removed in the office. The risk of long-term need for a catheter is very low. There is also a risk of worsening of overactive bladder.   Prolapse (with or without mesh): Risk factors for surgical failure  include things that put pressure on your pelvis and the surgical repair, including obesity, chronic cough, and heavy lifting or straining (including lifting children or adults, straining on the toilet, or lifting heavy objects such as furniture or anything weighing >25 lbs. Risks of recurrence is 20-30% with vaginal native tissue repair and a less than 10% with sacrocolpopexy with mesh.    - For preop Visit:  She is required to have a visit within 30 days of her surgery.    - Medical clearance: not required  - Anticoagulant use: No - Medicaid Hysterectomy form: n/a - Accepts blood transfusion: Yes - Expected length of stay: outpatient  Request sent for surgery scheduling.   Brooke Beards, MD

## 2023-11-17 ENCOUNTER — Ambulatory Visit: Payer: Medicare Other | Admitting: Obstetrics and Gynecology

## 2023-11-21 ENCOUNTER — Encounter: Payer: Self-pay | Admitting: Obstetrics and Gynecology

## 2023-11-25 DIAGNOSIS — L570 Actinic keratosis: Secondary | ICD-10-CM | POA: Diagnosis not present

## 2023-11-25 DIAGNOSIS — L239 Allergic contact dermatitis, unspecified cause: Secondary | ICD-10-CM | POA: Diagnosis not present

## 2023-11-25 DIAGNOSIS — L820 Inflamed seborrheic keratosis: Secondary | ICD-10-CM | POA: Diagnosis not present

## 2023-11-25 DIAGNOSIS — L6611 Classic lichen planopilaris: Secondary | ICD-10-CM | POA: Diagnosis not present

## 2023-11-29 ENCOUNTER — Encounter: Payer: Self-pay | Admitting: Obstetrics and Gynecology

## 2023-11-29 ENCOUNTER — Ambulatory Visit (INDEPENDENT_AMBULATORY_CARE_PROVIDER_SITE_OTHER): Admitting: Obstetrics and Gynecology

## 2023-11-29 VITALS — BP 135/85 | HR 73

## 2023-11-29 DIAGNOSIS — Z01818 Encounter for other preprocedural examination: Secondary | ICD-10-CM

## 2023-11-29 MED ORDER — ACETAMINOPHEN 500 MG PO TABS: 500.0000 mg | ORAL_TABLET | Freq: Four times a day (QID) | ORAL | 0 refills | Status: AC | PRN

## 2023-11-29 MED ORDER — POLYETHYLENE GLYCOL 3350 17 GM/SCOOP PO POWD: 17.0000 g | Freq: Every day | ORAL | 0 refills | Status: AC

## 2023-11-29 MED ORDER — IBUPROFEN 600 MG PO TABS: 600.0000 mg | ORAL_TABLET | Freq: Four times a day (QID) | ORAL | 0 refills | Status: AC | PRN

## 2023-11-29 MED ORDER — OXYCODONE HCL 5 MG PO TABS: 5.0000 mg | ORAL_TABLET | ORAL | 0 refills | Status: AC | PRN

## 2023-11-29 NOTE — Progress Notes (Signed)
 [facility] Pre-Operative Exam  Subjective Chief Complaint: [name] presents for a preoperative encounter.   History of Present Illness: [name] is a 73 y.o. female who presents for preoperative visit.  She is scheduled to undergo Exam under anesthesia, anterior repair, possible sacrospinous fixation, cystoscopy.  on 12/12/23.  Her symptoms include pelvic organ prolapse, and she was was found to have Stage II anterior, Stage I posterior, Stage I apical prolapse.   Urodynamics showed: 1. Sensation was increased; capacity was normal 2. Stress Incontinence was demonstrated at normal pressures; 3. Detrusor Overactivity was demonstrated without leakage. 4. Emptying was dysfunctional with a elevated PVR (225 with micturition study) (Initial uroflow had a PVR of 60ml), a sustained detrusor contraction present,  abdominal straining present at the end of the void, dyssynergic urethral sphincter activity on EMG.  Past Medical History:  Diagnosis Date   Anxiety    Depression    Hypothyroidism    Mild CAD    cath 03/2022: 20% proximal RCA stenosis   Thyroid disease      Past Surgical History:  Procedure Laterality Date   BACK SURGERY     BUNIONECTOMY Right    CESAREAN SECTION     LEFT HEART CATH AND CORONARY ANGIOGRAPHY N/A 03/15/2022   Procedure: LEFT HEART CATH AND CORONARY ANGIOGRAPHY;  Surgeon: Kathleene Hazel, MD;  Location: [facility];  Service: Cardiovascular;  Laterality: N/A;    is allergic to flagyl [metronidazole], hydrocodone-acetaminophen, tramadol, alendronate, amoxicillin, azithromycin, buspar [buspirone], buspirone hcl, celebrex [celecoxib], cymbalta [duloxetine hcl], duloxetine, emgality [galcanezumab-gnlm], estradiol, gabapentin, hydroxychloroquine, meloxicam, tizanidine, and topiramate.   Family History  Problem Relation Age of Onset   Breast cancer Mother    Hashimoto's thyroiditis Mother    Arthritis Mother    Heart attack  Father    CAD Father    Stroke Brother     Social History   Tobacco Use   Smoking status: Never   Smokeless tobacco: Never  Vaping Use   Vaping status: Never Used  Substance Use Topics   Alcohol use: Not Currently   Drug use: No     Review of Systems was negative for a full 10 system review except as noted in the History of Present Illness.   Current Outpatient Medications:    acetaminophen (TYLENOL) 500 MG tablet, Take 1 tablet (500 mg total) by mouth every 6 (six) hours as needed (pain)., Disp: 30 tablet, Rfl: 0   halobetasol (ULTRAVATE) 0.05 % cream, Apply topically 2 (two) times daily., Disp: , Rfl:    ibuprofen (ADVIL) 600 MG tablet, Take 1 tablet (600 mg total) by mouth every 6 (six) hours as needed., Disp: 30 tablet, Rfl: 0   Levothyroxine Sodium 137 MCG CAPS, , Disp: , Rfl: 1   ondansetron (ZOFRAN-ODT) 8 MG disintegrating tablet, Take 8 mg by mouth every 8 (eight) hours as needed for nausea or vomiting., Disp: , Rfl:    oxyCODONE (OXY IR/ROXICODONE) 5 MG immediate release tablet, Take 1 tablet (5 mg total) by mouth every 4 (four) hours as needed for severe pain (pain score 7-10)., Disp: 10 tablet, Rfl: 0   polyethylene glycol powder (GLYCOLAX/MIRALAX) 17 GM/SCOOP powder, Take 17 g by mouth daily. Drink 17g (1 scoop) dissolved in water per day., Disp: 255 g, Rfl: 0   Pseudoephedrine HCl (SUDAFED PO), Sudafed, Disp: , Rfl:    Ubrogepant (UBRELVY) 50 MG TABS, Take 1 tab at onset of migraine.  May repeat in 2 hrs, if  needed.  Max dose: 2 tabs/day. This is a 30 day prescription., Disp: 10 tablet, Rfl: 0   Objective Vitals:   11/29/23 1246  BP: 135/85  Pulse: 73    Gen: NAD CV: S1 S2 RRR Lungs: Clear to auscultation bilaterally Abd: soft, nontender   Previous Pelvic Exam showed: POP-Q   -0.5                                            Aa   -0.5                                           Ba   -6.5                                              C    1.5                                             Gh   3.5                                            Pb   9                                            tvl    -2                                            Ap   -2                                            Bp   -9                                         Assessment/ Plan  Assessment: The patient is a 73 y.o. year old scheduled to undergo Exam under anesthesia, anterior repair, possible sacrospinous fixation, cystoscopy. Verbal consent was obtained for these procedures.  Plan: General Surgical Consent: The patient has previously been counseled on alternative treatments, and the decision by the patient and provider was to proceed with the procedure listed above.  For all procedures, there are risks of bleeding, infection, damage to surrounding organs including but not limited to bowel, bladder, blood vessels, ureters and nerves, and need for further surgery if an injury were to occur. These risks are all low with minimally invasive surgery.   There are risks of numbness and weakness at any body site or buttock/rectal  pain.  It is possible that baseline pain can be worsened by surgery, either with or without mesh. If surgery is vaginal, there is also a low risk of possible conversion to laparoscopy or open abdominal incision where indicated. Very rare risks include blood transfusion, blood clot, heart attack, pneumonia, or death.   There is also a risk of short-term postoperative urinary retention with need to use a catheter. About half of patients need to go home from surgery with a catheter, which is then later removed in the office. The risk of long-term need for a catheter is very low. There is also a risk of worsening of overactive bladder.     Prolapse (with or without mesh): Risk factors for surgical failure  include things that put pressure on your pelvis and the surgical repair, including obesity, chronic cough, and heavy lifting or straining  (including lifting children or adults, straining on the toilet, or lifting heavy objects such as furniture or anything weighing >25 lbs. Risks of recurrence is 20-30% with vaginal native tissue repair and a less than 10% with sacrocolpopexy with mesh.     We discussed consent for blood products. Risks for blood transfusion include allergic reactions, other reactions that can affect different body organs and managed accordingly, transmission of infectious diseases such as HIV or Hepatitis. However, the blood is screened. Patient consents for blood products.  Pre-operative instructions:  She was instructed to not take Aspirin/NSAIDs x 7days prior to surgery. Antibiotic prophylaxis was ordered as indicated. Encouraged her to hold ginger.   Catheter use: Patient will go home with foley if needed after post-operative voiding trial.  Post-operative instructions:  She was provided with specific post-operative instructions, including precautions and signs/symptoms for which we would recommend contacting us, in addition to daytime and after-hours contact phone numbers. This was provided on a handout.   Post-operative medications: Prescriptions for motrin, tylenol, miralax, and oxycodone were sent to her pharmacy. Discussed using ibuprofen and tylenol on a schedule to limit use of narcotics.   Laboratory testing:  We will check labs: As requested by anesthesia.   Preoperative clearance:  She does not require surgical clearance.    Post-operative follow-up:  A post-operative appointment will be made for 6 weeks from the date of surgery. If she needs a post-operative nurse visit for a voiding trial, that will be set up after she leaves the hospital.    Patient will call the clinic or use MyChart should anything change or any new issues arise.   Selmer Dominion, NP

## 2023-11-29 NOTE — H&P (Signed)
 [facility] H&P  Subjective Chief Complaint: [name] presents for a preoperative encounter.   History of Present Illness: [name] is a 73 y.o. female who presents for preoperative visit.  She is scheduled to undergo Exam under anesthesia, anterior repair, possible sacrospinous fixation, cystoscopy.  on 12/12/23.  Her symptoms include pelvic organ prolapse, and she was was found to have Stage II anterior, Stage I posterior, Stage I apical prolapse.   Urodynamics showed: 1. Sensation was increased; capacity was normal 2. Stress Incontinence was demonstrated at normal pressures; 3. Detrusor Overactivity was demonstrated without leakage. 4. Emptying was dysfunctional with a elevated PVR (225 with micturition study) (Initial uroflow had a PVR of 60ml), a sustained detrusor contraction present,  abdominal straining present at the end of the void, dyssynergic urethral sphincter activity on EMG.  Past Medical History:  Diagnosis Date   Anxiety    Depression    Hypothyroidism    Mild CAD    cath 03/2022: 20% proximal RCA stenosis   Thyroid disease      Past Surgical History:  Procedure Laterality Date   BACK SURGERY     BUNIONECTOMY Right    CESAREAN SECTION     LEFT HEART CATH AND CORONARY ANGIOGRAPHY N/A 03/15/2022   Procedure: LEFT HEART CATH AND CORONARY ANGIOGRAPHY;  Surgeon: Kathleene Hazel, MD;  Location: [facility];  Service: Cardiovascular;  Laterality: N/A;    is allergic to flagyl [metronidazole], hydrocodone-acetaminophen, tramadol, alendronate, amoxicillin, azithromycin, buspar [buspirone], buspirone hcl, celebrex [celecoxib], cymbalta [duloxetine hcl], duloxetine, emgality [galcanezumab-gnlm], estradiol, gabapentin, hydroxychloroquine, meloxicam, tizanidine, and topiramate.   Family History  Problem Relation Age of Onset   Breast cancer Mother    Hashimoto's thyroiditis Mother    Arthritis Mother    Heart attack Father    CAD  Father    Stroke Brother     Social History   Tobacco Use   Smoking status: Never   Smokeless tobacco: Never  Vaping Use   Vaping status: Never Used  Substance Use Topics   Alcohol use: Not Currently   Drug use: No     Review of Systems was negative for a full 10 system review except as noted in the History of Present Illness.  No current facility-administered medications for this encounter.  Current Outpatient Medications:    acetaminophen (TYLENOL) 500 MG tablet, Take 1 tablet (500 mg total) by mouth every 6 (six) hours as needed (pain)., Disp: 30 tablet, Rfl: 0   halobetasol (ULTRAVATE) 0.05 % cream, Apply topically 2 (two) times daily., Disp: , Rfl:    ibuprofen (ADVIL) 600 MG tablet, Take 1 tablet (600 mg total) by mouth every 6 (six) hours as needed., Disp: 30 tablet, Rfl: 0   Levothyroxine Sodium 137 MCG CAPS, , Disp: , Rfl: 1   ondansetron (ZOFRAN-ODT) 8 MG disintegrating tablet, Take 8 mg by mouth every 8 (eight) hours as needed for nausea or vomiting., Disp: , Rfl:    oxyCODONE (OXY IR/ROXICODONE) 5 MG immediate release tablet, Take 1 tablet (5 mg total) by mouth every 4 (four) hours as needed for severe pain (pain score 7-10)., Disp: 10 tablet, Rfl: 0   polyethylene glycol powder (GLYCOLAX/MIRALAX) 17 GM/SCOOP powder, Take 17 g by mouth daily. Drink 17g (1 scoop) dissolved in water per day., Disp: 255 g, Rfl: 0   Pseudoephedrine HCl (SUDAFED PO), Sudafed, Disp: , Rfl:    Ubrogepant (UBRELVY) 50 MG TABS, Take 1 tab at onset of migraine.  May repeat in 2 hrs, if needed.  Max dose: 2 tabs/day. This is a 30 day prescription., Disp: 10 tablet, Rfl: 0   Objective There were no vitals filed for this visit.   Gen: NAD CV: S1 S2 RRR Lungs: Clear to auscultation bilaterally Abd: soft, nontender   Previous Pelvic Exam showed: POP-Q   -0.5                                            Aa   -0.5                                           Ba   -6.5                                               C    1.5                                            Gh   3.5                                            Pb   9                                            tvl    -2                                            Ap   -2                                            Bp   -9                                         Assessment/ Plan  Assessment: The patient is a 73 y.o. year old scheduled to undergo Exam under anesthesia, anterior repair, possible sacrospinous fixation, cystoscopy. Verbal consent was obtained for these procedures.

## 2023-12-01 ENCOUNTER — Encounter (HOSPITAL_COMMUNITY): Payer: Self-pay | Admitting: Obstetrics and Gynecology

## 2023-12-01 NOTE — Progress Notes (Signed)
 Spoke w/ via phone for pre-op interview--- [facility] Lab needs dos----   NONE      Lab results------ COVID test -----patient states asymptomatic no test needed Arrive at -------0530 NPO after MN NO Solid Food.   Pre-Surgery Ensure or G2:  Med rec completed Medications to take morning of surgery -----Levothyroxine. Bernita Raisin and Zofran PRN Diabetic medication -----  GLP1 agonist last dose: GLP1 instructions:  Patient instructed no nail polish to be worn day of surgery Patient instructed to bring photo id and insurance card day of surgery Patient aware to have Driver (ride ) / caregiver    for 24 hours after surgery - Friend Elease Etienne Patient Special Instructions ----- Shower with antibacterial soap. Pre-Op special Instructions -----  Patient verbalized understanding of instructions that were given at this phone interview. Patient denies chest pain, sob, fever, cough at the interview.

## 2023-12-07 ENCOUNTER — Encounter: Admitting: Obstetrics and Gynecology

## 2023-12-11 NOTE — Anesthesia Preprocedure Evaluation (Signed)
 Anesthesia Evaluation  Patient identified by MRN, date of birth, ID band Patient awake    Reviewed: Allergy & Precautions, NPO status , Patient's Chart, lab work & pertinent test results  Airway Mallampati: III  TM Distance: >3 FB Neck ROM: Full    Dental no notable dental hx. (+) Teeth Intact, Dental Advisory Given   Pulmonary asthma    Pulmonary exam normal breath sounds clear to auscultation       Cardiovascular + CAD and + Past MI  Normal cardiovascular exam Rhythm:Regular Rate:Normal  TTE 2023  1. Left ventricular ejection fraction, by estimation, is 60 to 65%. The  left ventricle has normal function. The left ventricle has no regional  wall motion abnormalities. There is mild left ventricular hypertrophy.  Left ventricular diastolic parameters  were normal.   2. Right ventricular systolic function is normal. The right ventricular  size is normal.   3. Left atrial size was mildly dilated.   4. The mitral valve is normal in structure. Trivial mitral valve  regurgitation.   5. The aortic valve was not well visualized. Aortic valve regurgitation  is trivial. No aortic stenosis is present.   6. The inferior vena cava is dilated in size with <50% respiratory  variability, suggesting right atrial pressure of 15 mmHg.   Cath 2023   Prox RCA lesion is 20% stenosed. Mild non-obstructive CAD Normal LVEDP    Neuro/Psych  Headaches PSYCHIATRIC DISORDERS Anxiety Depression       GI/Hepatic negative GI ROS, Neg liver ROS,,,  Endo/Other  Hypothyroidism    Renal/GU negative Renal ROS  negative genitourinary   Musculoskeletal negative musculoskeletal ROS (+)    Abdominal   Peds  Hematology negative hematology ROS (+)   Anesthesia Other Findings   Reproductive/Obstetrics                             Anesthesia Physical Anesthesia Plan  ASA: 3  Anesthesia Plan: General   Post-op Pain  Management: Tylenol PO (pre-op)*   Induction: Intravenous  PONV Risk Score and Plan: 3 and Ondansetron, Dexamethasone and Treatment may vary due to age or medical condition  Airway Management Planned: LMA  Additional Equipment:   Intra-op Plan:   Post-operative Plan: Extubation in OR  Informed Consent: I have reviewed the patients History and Physical, chart, labs and discussed the procedure including the risks, benefits and alternatives for the proposed anesthesia with the patient or authorized representative who has indicated his/her understanding and acceptance.     Dental advisory given  Plan Discussed with: CRNA  Anesthesia Plan Comments:        Anesthesia Quick Evaluation

## 2023-12-12 ENCOUNTER — Ambulatory Visit (HOSPITAL_COMMUNITY)
Admission: RE | Admit: 2023-12-12 | Discharge: 2023-12-12 | Disposition: A | Discharge status: Home / Self Care | Source: Ambulatory Visit | Attending: Obstetrics and Gynecology | Admitting: Obstetrics and Gynecology

## 2023-12-12 ENCOUNTER — Ambulatory Visit (HOSPITAL_COMMUNITY): Payer: Self-pay | Admitting: Anesthesiology

## 2023-12-12 ENCOUNTER — Other Ambulatory Visit: Payer: Self-pay

## 2023-12-12 ENCOUNTER — Telehealth: Payer: Self-pay | Admitting: Obstetrics and Gynecology

## 2023-12-12 ENCOUNTER — Ambulatory Visit (HOSPITAL_BASED_OUTPATIENT_CLINIC_OR_DEPARTMENT_OTHER): Payer: Self-pay | Admitting: Anesthesiology

## 2023-12-12 ENCOUNTER — Encounter (HOSPITAL_COMMUNITY)
Admission: RE | Disposition: A | Discharge status: Home / Self Care | Payer: Self-pay | Source: Ambulatory Visit | Attending: Obstetrics and Gynecology

## 2023-12-12 ENCOUNTER — Encounter (HOSPITAL_COMMUNITY): Payer: Self-pay | Admitting: Obstetrics and Gynecology

## 2023-12-12 DIAGNOSIS — N8110 Cystocele, unspecified: Secondary | ICD-10-CM

## 2023-12-12 DIAGNOSIS — I252 Old myocardial infarction: Secondary | ICD-10-CM | POA: Insufficient documentation

## 2023-12-12 DIAGNOSIS — I2510 Atherosclerotic heart disease of native coronary artery without angina pectoris: Secondary | ICD-10-CM | POA: Insufficient documentation

## 2023-12-12 DIAGNOSIS — F419 Anxiety disorder, unspecified: Secondary | ICD-10-CM | POA: Diagnosis not present

## 2023-12-12 DIAGNOSIS — J45909 Unspecified asthma, uncomplicated: Secondary | ICD-10-CM | POA: Insufficient documentation

## 2023-12-12 DIAGNOSIS — E039 Hypothyroidism, unspecified: Secondary | ICD-10-CM | POA: Diagnosis not present

## 2023-12-12 DIAGNOSIS — N993 Prolapse of vaginal vault after hysterectomy: Secondary | ICD-10-CM | POA: Insufficient documentation

## 2023-12-12 DIAGNOSIS — N393 Stress incontinence (female) (male): Secondary | ICD-10-CM | POA: Diagnosis not present

## 2023-12-12 HISTORY — PX: CYSTOCELE REPAIR: SHX163

## 2023-12-12 HISTORY — PX: CYSTOSCOPY: SHX5120

## 2023-12-12 HISTORY — PX: VAGINAL PROLAPSE REPAIR: SHX830

## 2023-12-12 SURGERY — COLPORRHAPHY, ANTERIOR, FOR CYSTOCELE REPAIR
Anesthesia: General | Site: Vagina

## 2023-12-12 MED ORDER — ORAL CARE MOUTH RINSE: 15.0000 mL | Freq: Once | OROMUCOSAL | Status: AC

## 2023-12-12 MED ORDER — PROPOFOL 10 MG/ML IV BOLUS
INTRAVENOUS | Status: AC
  Filled 2023-12-12: qty 20

## 2023-12-12 MED ORDER — PHENAZOPYRIDINE HCL 100 MG PO TABS
ORAL_TABLET | ORAL | Status: AC
  Filled 2023-12-12: qty 2

## 2023-12-12 MED ORDER — ONDANSETRON HCL 4 MG/2ML IJ SOLN
INTRAMUSCULAR | Status: AC
  Filled 2023-12-12: qty 2

## 2023-12-12 MED ORDER — OXYCODONE HCL 5 MG/5ML PO SOLN: 5.0000 mg | Freq: Once | ORAL | Status: DC | PRN

## 2023-12-12 MED ORDER — DEXAMETHASONE SODIUM PHOSPHATE 10 MG/ML IJ SOLN
INTRAMUSCULAR | Status: DC | PRN
  Administered 2023-12-12: 10 mg via INTRAVENOUS

## 2023-12-12 MED ORDER — AMISULPRIDE (ANTIEMETIC) 5 MG/2ML IV SOLN: 10.0000 mg | Freq: Once | INTRAVENOUS | Status: DC | PRN

## 2023-12-12 MED ORDER — MIDAZOLAM HCL 2 MG/2ML IJ SOLN
INTRAMUSCULAR | Status: AC
  Filled 2023-12-12: qty 2

## 2023-12-12 MED ORDER — ACETAMINOPHEN 500 MG PO TABS
1000.0000 mg | ORAL_TABLET | ORAL | Status: AC
  Administered 2023-12-12: 1000 mg via ORAL

## 2023-12-12 MED ORDER — DEXMEDETOMIDINE HCL IN NACL 80 MCG/20ML IV SOLN
INTRAVENOUS | Status: DC | PRN
  Administered 2023-12-12: 8 ug via INTRAVENOUS

## 2023-12-12 MED ORDER — ONDANSETRON HCL 4 MG/2ML IJ SOLN
INTRAMUSCULAR | Status: DC | PRN
  Administered 2023-12-12: 4 mg via INTRAVENOUS

## 2023-12-12 MED ORDER — POVIDONE-IODINE 10 % EX SWAB: 2.0000 | Freq: Once | CUTANEOUS | Status: DC

## 2023-12-12 MED ORDER — PHENYLEPHRINE 80 MCG/ML (10ML) SYRINGE FOR IV PUSH (FOR BLOOD PRESSURE SUPPORT)
PREFILLED_SYRINGE | INTRAVENOUS | Status: DC | PRN
  Administered 2023-12-12: 160 ug via INTRAVENOUS
  Administered 2023-12-12: 40 ug via INTRAVENOUS
  Administered 2023-12-12: 80 ug via INTRAVENOUS
  Administered 2023-12-12: 40 ug via INTRAVENOUS
  Administered 2023-12-12: 80 ug via INTRAVENOUS

## 2023-12-12 MED ORDER — ACETAMINOPHEN 500 MG PO TABS
ORAL_TABLET | ORAL | Status: AC
  Filled 2023-12-12: qty 2

## 2023-12-12 MED ORDER — LIDOCAINE 2% (20 MG/ML) 5 ML SYRINGE
INTRAMUSCULAR | Status: AC
  Filled 2023-12-12: qty 5

## 2023-12-12 MED ORDER — SODIUM CHLORIDE 0.9 % IR SOLN
Status: DC | PRN
  Administered 2023-12-12: 1000 mL via INTRAVESICAL

## 2023-12-12 MED ORDER — CIPROFLOXACIN IN D5W 400 MG/200ML IV SOLN
400.0000 mg | INTRAVENOUS | Status: AC
Start: 2023-12-12 — End: 2023-12-12
  Administered 2023-12-12: 400 mg via INTRAVENOUS
  Filled 2023-12-12: qty 200

## 2023-12-12 MED ORDER — LIDOCAINE-EPINEPHRINE 1 %-1:100000 IJ SOLN
INTRAMUSCULAR | Status: DC | PRN
  Administered 2023-12-12: 20 mL

## 2023-12-12 MED ORDER — FENTANYL CITRATE (PF) 100 MCG/2ML IJ SOLN: 25.0000 ug | INTRAMUSCULAR | Status: DC | PRN

## 2023-12-12 MED ORDER — EPHEDRINE 5 MG/ML INJ
INTRAVENOUS | Status: AC
Start: 2023-12-12 — End: ?
  Filled 2023-12-12: qty 5

## 2023-12-12 MED ORDER — FENTANYL CITRATE (PF) 250 MCG/5ML IJ SOLN
INTRAMUSCULAR | Status: AC
  Filled 2023-12-12: qty 5

## 2023-12-12 MED ORDER — LIDOCAINE 2% (20 MG/ML) 5 ML SYRINGE
INTRAMUSCULAR | Status: DC | PRN
Start: 2023-12-12 — End: 2023-12-12
  Administered 2023-12-12: 60 mg via INTRAVENOUS

## 2023-12-12 MED ORDER — CHLORHEXIDINE GLUCONATE 0.12 % MT SOLN
15.0000 mL | Freq: Once | OROMUCOSAL | Status: AC
  Administered 2023-12-12: 15 mL via OROMUCOSAL

## 2023-12-12 MED ORDER — PHENYLEPHRINE 80 MCG/ML (10ML) SYRINGE FOR IV PUSH (FOR BLOOD PRESSURE SUPPORT)
PREFILLED_SYRINGE | INTRAVENOUS | Status: AC
  Filled 2023-12-12: qty 10

## 2023-12-12 MED ORDER — MIDAZOLAM HCL 2 MG/2ML IJ SOLN
INTRAMUSCULAR | Status: DC | PRN
  Administered 2023-12-12 (×2): 1 mg via INTRAVENOUS

## 2023-12-12 MED ORDER — PROPOFOL 10 MG/ML IV BOLUS
INTRAVENOUS | Status: DC | PRN
  Administered 2023-12-12: 140 mg via INTRAVENOUS

## 2023-12-12 MED ORDER — EPHEDRINE SULFATE-NACL 50-0.9 MG/10ML-% IV SOSY
PREFILLED_SYRINGE | INTRAVENOUS | Status: DC | PRN
Start: 2023-12-12 — End: 2023-12-12
  Administered 2023-12-12 (×2): 10 mg via INTRAVENOUS
  Administered 2023-12-12: 5 mg via INTRAVENOUS

## 2023-12-12 MED ORDER — FENTANYL CITRATE (PF) 250 MCG/5ML IJ SOLN
INTRAMUSCULAR | Status: DC | PRN
  Administered 2023-12-12: 50 ug via INTRAVENOUS
  Administered 2023-12-12 (×2): 25 ug via INTRAVENOUS

## 2023-12-12 MED ORDER — OXYCODONE HCL 5 MG PO TABS: 5.0000 mg | ORAL_TABLET | Freq: Once | ORAL | Status: DC | PRN

## 2023-12-12 MED ORDER — CHLORHEXIDINE GLUCONATE 0.12 % MT SOLN
OROMUCOSAL | Status: AC
  Filled 2023-12-12: qty 15

## 2023-12-12 MED ORDER — LIDOCAINE-EPINEPHRINE 1 %-1:100000 IJ SOLN
INTRAMUSCULAR | Status: AC
  Filled 2023-12-12: qty 1

## 2023-12-12 MED ORDER — LACTATED RINGERS IV SOLN: INTRAVENOUS | Status: DC

## 2023-12-12 MED ORDER — DEXAMETHASONE SODIUM PHOSPHATE 10 MG/ML IJ SOLN
INTRAMUSCULAR | Status: AC
  Filled 2023-12-12: qty 1

## 2023-12-12 MED ORDER — STERILE WATER FOR IRRIGATION IR SOLN
Status: DC | PRN
  Administered 2023-12-12: 1000 mL

## 2023-12-12 MED ORDER — PHENAZOPYRIDINE HCL 100 MG PO TABS
200.0000 mg | ORAL_TABLET | ORAL | Status: AC
  Administered 2023-12-12: 200 mg via ORAL

## 2023-12-12 MED ORDER — SODIUM CHLORIDE (PF) 0.9 % IJ SOLN
INTRAMUSCULAR | Status: AC
  Filled 2023-12-12: qty 20

## 2023-12-12 MED ORDER — CLINDAMYCIN PHOSPHATE 900 MG/50ML IV SOLN
900.0000 mg | INTRAVENOUS | Status: AC
Start: 2023-12-12 — End: 2023-12-12
  Administered 2023-12-12: 900 mg via INTRAVENOUS

## 2023-12-12 MED ORDER — CLINDAMYCIN PHOSPHATE 900 MG/50ML IV SOLN
INTRAVENOUS | Status: AC
  Filled 2023-12-12: qty 50

## 2023-12-12 SURGICAL SUPPLY — 31 items
BLADE SURG 15 STRL LF DISP TIS (BLADE) ×2 IMPLANT
DEVICE CAPIO SLIM SINGLE (INSTRUMENTS) IMPLANT
ELECT REM PT RETURN 9FT ADLT (ELECTROSURGICAL) IMPLANT
ELECTRODE REM PT RTRN 9FT ADLT (ELECTROSURGICAL) IMPLANT
GLOVE BIOGEL PI IND STRL 6.5 (GLOVE) ×2 IMPLANT
GLOVE ECLIPSE 6.0 STRL STRAW (GLOVE) ×2 IMPLANT
GOWN STRL REUS W/ TWL LRG LVL3 (GOWN DISPOSABLE) ×2 IMPLANT
GOWN STRL REUS W/TWL LRG LVL3 (GOWN DISPOSABLE) ×2 IMPLANT
HIBICLENS CHG 4% 4OZ BTL (MISCELLANEOUS) ×2 IMPLANT
IV NS 1000ML BAXH (IV SOLUTION) IMPLANT
KIT TURNOVER KIT B (KITS) ×2 IMPLANT
MANIFOLD NEPTUNE II (INSTRUMENTS) ×2 IMPLANT
NDL HYPO 22X1.5 SAFETY MO (MISCELLANEOUS) ×2 IMPLANT
NDL MAYO 6 CRC TAPER PT (NEEDLE) IMPLANT
NEEDLE HYPO 22X1.5 SAFETY MO (MISCELLANEOUS) ×2 IMPLANT
NEEDLE MAYO 6 CRC TAPER PT (NEEDLE) ×2 IMPLANT
NS IRRIG 1000ML POUR BTL (IV SOLUTION) ×2 IMPLANT
PACK CYSTO (CUSTOM PROCEDURE TRAY) ×2 IMPLANT
PACK VAGINAL WOMENS (CUSTOM PROCEDURE TRAY) ×2 IMPLANT
RETRACTOR STAY HOOK 5MM (MISCELLANEOUS) ×2 IMPLANT
SET CYSTO W/LG BORE CLAMP LF (SET/KITS/TRAYS/PACK) ×2 IMPLANT
SLEEVE SCD COMPRESS KNEE MED (STOCKING) ×2 IMPLANT
SPIKE FLUID TRANSFER (MISCELLANEOUS) IMPLANT
SURGIFLO W/THROMBIN 8M KIT (HEMOSTASIS) IMPLANT
SUT ABS MONO DBL WITH NDL 48IN (SUTURE) IMPLANT
SUT VIC AB 0 CT1 27XBRD ANTBC (SUTURE) ×2 IMPLANT
SUT VICRYL 2-0 SH 8X27 (SUTURE) ×2 IMPLANT
SYR BULB EAR ULCER 3OZ GRN STR (SYRINGE) IMPLANT
TOWEL GREEN STERILE FF (TOWEL DISPOSABLE) ×2 IMPLANT
TRAY FOLEY W/BAG SLVR 14FR LF (SET/KITS/TRAYS/PACK) ×2 IMPLANT
WATER STERILE IRR 1000ML POUR (IV SOLUTION) IMPLANT

## 2023-12-12 NOTE — Telephone Encounter (Signed)
 [name] underwent anterior repair, sacrospinous fixation, cystoscopy on 12/12/23.   She passed her voiding trial.  was backfilled into the bladder Voided  PVR by bladder scan was 0ml.   She was discharged without a catheter. Please call her for a routine post op check. Thanks!  [name], MD

## 2023-12-12 NOTE — Discharge Instructions (Addendum)
 DO NOT TAKE TYLENOL UNTIL AFTER 12:30PM today.    POST OPERATIVE INSTRUCTIONS  General Instructions Recovery (not bed rest) will last approximately 6 weeks Walking is encouraged, but refrain from strenuous exercise/ housework/ heavy lifting. No lifting >10lbs  Nothing in the vagina- NO intercourse, tampons or douching Bathing:  Do not submerge in water (NO swimming, bath, hot tub, etc) until after your postop visit. You can shower starting the day after surgery.  No driving until you are not taking narcotic pain medicine and until your pain is well enough controlled that you can slam on the breaks or make sudden movements if needed.   Taking your medications Please take your acetaminophen and ibuprofen on a schedule for the first 48 hours. Take 600mg  ibuprofen, then take 500mg  acetaminophen 3 hours later, then continue to alternate ibuprofen and acetaminophen. That way you are taking each type of medication every 6 hours. Take the prescribed narcotic (oxycodone, tramadol, etc) as needed, with a maximum being every 4 hours.  Take a stool softener daily to keep your stools soft and preventing you from straining. If you have diarrhea, you decrease your stool softener. This is explained more below. We have prescribed you Miralax.  Reasons to Call the Nurse (see last page for phone numbers) Heavy Bleeding (changing your pad every 1-2 hours) Persistent nausea/vomiting Fever (100.4 degrees or more) Incision problems (pus or other fluid coming out, redness, warmth, increased pain)  Things to Expect After Surgery Mild to Moderate pain is normal during the first day or two after surgery. If prescribed, take Ibuprofen or Tylenol first and use the stronger medicine for "break-through" pain. You can overlap these medicines because they work differently.   Constipation   To Prevent Constipation:  Eat a well-balanced diet including protein, grains, fresh fruit and vegetables.  Drink plenty of fluids.  Walk regularly.  Depending on specific instructions from your physician: take Miralax daily and additionally you can add a stool softener (colace/ docusate) and fiber supplement. Continue as long as you're on pain medications.   To Treat Constipation:  If you do not have a bowel movement in 2 days after surgery, you can take 2 Tbs of Milk of Magnesia 1-2 times a day until you have a bowel movement. If diarrhea occurs, decrease the amount or stop the laxative. If no results with Milk of Magnesia, you can drink a bottle of magnesium citrate which you can purchase over the counter.  Fatigue:  This is a normal response to surgery and will improve with time.  Plan frequent rest periods throughout the day.  Gas Pain:  This is very common but can also be very painful! Drink warm liquids such as herbal teas, bouillon or soup. Walking will help you pass more gas.  Mylicon or Gas-X can be taken over the counter.  Leaking Urine:  Varying amounts of leakage may occur after surgery.  This should improve with time. Your bladder needs at least 3 months to recover from surgery. If you leak after surgery, be sure to mention this to your doctor at your post-op visit. If you were taking medications for overactive bladder prior to surgery, be sure to restart the medications immediately after surgery.  Incisions: If you have incisions on your abdomen, the skin glue will dissolve on its own over time. It is ok to gently rinse with soap and water over these incisions but do not scrub.  Catheter Approximately 50% of patients are unable to urinate after surgery and need  to go home with a catheter. This allows your bladder to rest so it can return to full function. If you go home with a catheter, the office will call to set up a voiding trial a few days after surgery. For most patients, by this visit, they are able to urinate on their own. Long term catheter use is rare.   Return to Work  As work demands and recovery times  vary widely, it is hard to predict when you will want to return to work. If you have a desk job with no strenuous physical activity, and if you would like to return sooner than generally recommended, discuss this with your provider or call our office.   Post op concerns  For non-emergent issues, please call the Urogynecology Nurse. Please leave a message and someone will contact you within one business day.  You can also send a message through MyChart.   AFTER HOURS (After 5:00 PM and on weekends):  For urgent matters that cannot wait until the next business day. Call our office [phone] and connect to the doctor on call.  Please reserve this for important issues.   **FOR ANY TRUE EMERGENCY ISSUES CALL 911 OR GO TO THE NEAREST EMERGENCY ROOM.** Please inform our office or the doctor on call of any emergency.     APPOINTMENTS: Call [phone]  Post Anesthesia Home Care Instructions  Activity: Get plenty of rest for the remainder of the day. A responsible adult should stay with you for 24 hours following the procedure.  For the next 24 hours, DO NOT: -Drive a car -Advertising copywriter -Drink alcoholic beverages -Take any medication unless instructed by your physician -Make any legal decisions or sign important papers.  Meals: Start with liquid foods such as gelatin or soup. Progress to regular foods as tolerated. Avoid greasy, spicy, heavy foods. If nausea and/or vomiting occur, drink only clear liquids until the nausea and/or vomiting subsides. Call your physician if vomiting continues.  Special Instructions/Symptoms: Your throat may feel dry or sore from the anesthesia or the breathing tube placed in your throat during surgery. If this causes discomfort, gargle with warm salt water. The discomfort should disappear within 24 hours.  Call your surgeon if you experience:   1.  Fever over 101.0. 2.  Inability to urinate. 3.  Nausea and/or vomiting. 4.  Extreme swelling or bruising  at the surgical site. 5.  Continued bleeding from the incision. 6.  Increased pain, redness or drainage from the incision. 7.  Problems related to your pain medication. 8. Any change in color, movement and/or sensation 9. Any problems and/or concerns

## 2023-12-12 NOTE — Anesthesia Postprocedure Evaluation (Signed)
 Anesthesia Post Note  Patient: [name]  Procedure(s) Performed: COLPORRHAPHY, ANTERIOR, FOR CYSTOCELE REPAIR (Vagina ) SUSPENSION, VAGINAL VAULT (Vagina ) CYSTOSCOPY (Bladder)     Patient location during evaluation: PACU Anesthesia Type: General Level of consciousness: awake and alert Pain management: pain level controlled Vital Signs Assessment: post-procedure vital signs reviewed and stable Respiratory status: spontaneous breathing, nonlabored ventilation, respiratory function stable and patient connected to nasal cannula oxygen Cardiovascular status: blood pressure returned to baseline and stable Postop Assessment: no apparent nausea or vomiting Anesthetic complications: no  No notable events documented.  Last Vitals:  Vitals:   12/12/23 0837 12/12/23 0915  BP: 116/66 121/69  Pulse: 93 76  Resp: 14 11  Temp: 36.4 C   SpO2: 99% 96%    Last Pain:  Vitals:   12/12/23 0837  TempSrc:   PainSc: Asleep                 Riti Rollyson L Sidonia Nutter

## 2023-12-12 NOTE — Interval H&P Note (Signed)
 History and Physical Interval Note:  12/12/2023 7:16 AM  [name]  has presented today for surgery, with the diagnosis of anterior vaginal prolapse.  The various methods of treatment have been discussed with the patient and family. After consideration of risks, benefits and other options for treatment, the patient has consented to  Procedure(s) with comments: COLPORRHAPHY, ANTERIOR, FOR CYSTOCELE REPAIR (N/A) SUSPENSION, VAGINAL VAULT (N/A) - sacrospinous ligament fixation CYSTOSCOPY (N/A) as a surgical intervention.  The patient's history has been reviewed, patient examined, no change in status, stable for surgery.  I have reviewed the patient's chart and labs.  Questions were answered to the patient's satisfaction.     [name]

## 2023-12-12 NOTE — Transfer of Care (Signed)
 mmediate Anesthesia Transfer of Care Note  Patient: [name]  Procedure(s) Performed: COLPORRHAPHY, ANTERIOR, FOR CYSTOCELE REPAIR (Vagina ) SUSPENSION, VAGINAL VAULT (Vagina ) CYSTOSCOPY (Bladder)  Patient Location: PACU  Anesthesia Type:General  Level of Consciousness: drowsy  Airway & Oxygen Therapy: Patient Spontanous Breathing and Patient connected to nasal cannula oxygen  Post-op Assessment: Report given to RN and Post -op Vital signs reviewed and stable  Post vital signs: Reviewed and stable  Last Vitals:  Vitals Value Taken Time  BP 116/66 12/12/23 0837  Temp 97.6   Pulse 91 12/12/23 0842  Resp 17 12/12/23 0842  SpO2 98 % 12/12/23 0842  Vitals shown include unfiled device data.  Last Pain:  Vitals:   12/12/23 0601  TempSrc: Oral  PainSc: 2       Patients Stated Pain Goal: 5 (12/12/23 0601)  Complications: No notable events documented.

## 2023-12-12 NOTE — Op Note (Signed)
 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 On vaginal exam, stage II prolapse present  2. On cystoscopy, normal bladder and urethral mucosa without injury or lesion. Brisk bilateral ureteral efflux present.    Specimens: none  Estimated blood loss: 25 mL  IV fluids: 1700 mL  Urine output: 75 mL  Complications: none  Procedure in Detail:  After informed consent was obtained, the patient was taken to the operating room where anesthesia was induced and found to be adequate. She was placed in dorsal lithotomy position, taking care to avoid any traction on the extremities, and then prepped and draped in the usual sterile fashion. A self-retaining lonestar retractor was placed using four elastic blue stays.  After a foley catheter was inserted into the urethra, the location of the midurethra was palpated. Two Allis clamps were along the anterior vaginal wall defect. 1% lidocaine with epinephrine was injected into the vaginal mucosa.  A vertical incision was made between these two Allis clamps with a 15 blade scalpel.  Allis clamps were placed along this incision and Metzenbaum scissors were used to undermine the vaginal mucosa along the incision.  The vaginal mucosa was then sharply dissected off to the vesicovaginal septum bilaterally to the level of the pubic rami.    For the sacrospinous ligament fixation (SSLF), the ischial spine was accessed on the right side via dissection with Metzenbaum scissors and blunt dissection.  The sacrospinous ligament was palpated. Two 0 PDS suture was then placed at the sacrospinous ligament two fingerbreadths medial to the ischial spine, in order to avoid the pudendal  neurovascular bundle, using a Capio needle driver.  The PDS suture was attached to the vaginal epithelium on the ipsilateral side of the vaginal apex and held. Anterior plication of the vesicovaginal septum was then performed using mattress sutures of 2-0 Vicryl. The vaginal mucosal edges were trimmed and the incision reapproximated with 2-0 Vicryl in a running fashion. The SSLF suture was then tied down with excellent support of the anterior and apical vagina.   The Foley catheter was removed.  A 70-degree cystoscope was introduced, and 360-degree inspection revealed no trauma to the bladder, with bilateral ureteral efflux.  The bladder was drained and the cystoscope was removed.  The Foley catheter was reinserted.  The vagina was copiously irrigated.  Hemostasis was noted.  Vaginal packing was not placed.  A rectal examination was normal and confirmed no sutures within the rectum.  The patient tolerated the procedure well.  She was awakened from anesthesia and transferred to the recovery room in stable condition. All counts were correct x 2.    [name], MD

## 2023-12-12 NOTE — Anesthesia Procedure Notes (Signed)
 Procedure Name: LMA Insertion Date/Time: 12/12/2023 7:37 AM  Performed by: Colbert Coyer, CRNAPre-anesthesia Checklist: Patient identified, Emergency Drugs available, Suction available and Patient being monitored Patient Re-evaluated:Patient Re-evaluated prior to induction Oxygen Delivery Method: Circle System Utilized Preoxygenation: Pre-oxygenation with 100% oxygen Induction Type: IV induction Ventilation: Mask ventilation without difficulty LMA: LMA inserted LMA Size: 4.0 Number of attempts: 1 Placement Confirmation: positive ETCO2 Dental Injury: Teeth and Oropharynx as per pre-operative assessment

## 2023-12-13 ENCOUNTER — Telehealth: Payer: Self-pay

## 2023-12-13 ENCOUNTER — Encounter (HOSPITAL_COMMUNITY): Payer: Self-pay | Admitting: Obstetrics and Gynecology

## 2023-12-13 ENCOUNTER — Ambulatory Visit: Payer: Medicare Other | Admitting: Physical Medicine & Rehabilitation

## 2023-12-13 NOTE — Telephone Encounter (Signed)
 [name]  underwent Colporrhaphy, Anterior, For Cystocele Repair, Suspension, Vaginal Vault, and Cystoscopy  on 12/12/2023  with [x] Dr Florian Buff [] Dr Olena Leatherwood.  The patient reports that her pain is not controlled.  She is taking [] No Medication [x] Acetaminophen 500mg  every 6 hours [x] Ibuprofen 600mg  every 6 hours or []  Prescribed Narcotic.  Her pain level is 7[x] with medication [] Without medication is.   She minimal vaginal bleeding.  The patient is tolerating PO fluids and solids. She has not had a bowel movement and is not taking Miralax for a bowel regimen. She is not passing gas.  She was discharged without a catheter.   [x]  Discharged without a catheter, the patient does feel as if she is emptying her bladder.   Reviewed Post operative instructions as needed to answer additional questions.   CC'd note to patient's provider.

## 2023-12-14 NOTE — Telephone Encounter (Signed)
 DONE 12/13/23

## 2023-12-16 ENCOUNTER — Telehealth: Payer: Self-pay | Admitting: Obstetrics and Gynecology

## 2023-12-16 ENCOUNTER — Encounter: Payer: Self-pay | Admitting: Obstetrics and Gynecology

## 2023-12-16 MED ORDER — METHOCARBAMOL 750 MG PO TABS: 750.0000 mg | ORAL_TABLET | Freq: Four times a day (QID) | ORAL | 0 refills | Status: AC | PRN

## 2023-12-16 NOTE — Telephone Encounter (Signed)
 Patient called the office and is still complaining of tail bone pain that's an 8 of 10 For pain, she used her pain medications on Wednesday and hasn't helped, just makes her groggy feeling, currently using ibuprofen and Tylenol alternating doses. Feels the only improves with laying down and keeping still.  Would you like to schedule an appointment  No signs of fever or bleeding or any other complaints.

## 2023-12-16 NOTE — Telephone Encounter (Signed)
 Called and left a message for patient: She is reporting pain 8/10 in the tailbone area. She is not able to take Gabapentin, Lyrica, or Meloxicam due to her reactions to these medications. Encouraged the use of lidocaine patches as well as heat to the area for topical support.  Will also send in Robaxin which she may try to help with the muscle pain associated with a sacrospinous fixation.

## 2023-12-16 NOTE — Telephone Encounter (Signed)
 K.Zuleta NP has reached out to the patient with instructions.

## 2024-01-20 DIAGNOSIS — H5712 Ocular pain, left eye: Secondary | ICD-10-CM | POA: Diagnosis not present

## 2024-01-23 DIAGNOSIS — K080 Exfoliation of teeth due to systemic causes: Secondary | ICD-10-CM | POA: Diagnosis not present

## 2024-01-24 ENCOUNTER — Encounter: Payer: Self-pay | Admitting: Obstetrics and Gynecology

## 2024-01-24 ENCOUNTER — Ambulatory Visit (INDEPENDENT_AMBULATORY_CARE_PROVIDER_SITE_OTHER): Admitting: Obstetrics and Gynecology

## 2024-01-24 VITALS — BP 116/70 | HR 80

## 2024-01-24 DIAGNOSIS — Z48816 Encounter for surgical aftercare following surgery on the genitourinary system: Secondary | ICD-10-CM

## 2024-01-24 DIAGNOSIS — Z9889 Other specified postprocedural states: Secondary | ICD-10-CM

## 2024-01-24 DIAGNOSIS — N393 Stress incontinence (female) (male): Secondary | ICD-10-CM | POA: Diagnosis not present

## 2024-01-24 MED ORDER — SULFAMETHOXAZOLE-TRIMETHOPRIM 800-160 MG PO TABS
1.0000 | ORAL_TABLET | Freq: Two times a day (BID) | ORAL | 0 refills | Status: AC
Start: 2024-01-24 — End: 2024-01-25

## 2024-01-24 NOTE — Progress Notes (Signed)
 [facility]  Date of Visit: 01/24/2024  History of Present Illness: [name] is a 73 y.o. female scheduled today for a post-operative visit.   Surgery: s/p Anterior repair, sacrospinous ligament fixation, cystoscopy on 12/12/23  She passed her postoperative void trial.   Postoperative course has been uncomplicated. Has had some buttock/ tailbone pain and was prescribed robaxin .   Today she reports she is feeling well overall. Pain has resolved. She no longer has urinary urgency and is not getting ip constantly at night.  After she urinates, she still feels like there is a little more that comes out. Wears a liner, just a small amount. She does feel a little scratchy feeling inside. Had vaginal bleeding about 2.5 weeks ago but it stopped. She is not using estrogen cream.   UTI in the last 6 weeks? No  Pain? No  She has returned to her normal activity (except for postop restrictions) Vaginal bulge? No  Stress incontinence: Yes  Urgency/frequency: No  Urge incontinence: No  Voiding dysfunction: No  Bowel issues: No   Subjective Success: Do you usually have a bulge or something falling out that you can see or feel in the vaginal area? No  Retreatment Success: Any retreatment with surgery or pessary for any compartment? No    Medications: She has a current medication list which includes the following prescription(s): acetaminophen , acetylcysteine, calcium  carb-cholecalciferol, coenzyme q10-vitamin e, halobetasol, ibuprofen , levothyroxine  sodium, loratadine, methocarbamol , ondansetron , oxycodone , polyethylene glycol powder, pseudoephedrine, sulfamethoxazole-trimethoprim, ubrelvy , and vitamin d-vitamin k.   Allergies: Patient is allergic to flagyl [metronidazole], hydrocodone -acetaminophen , tramadol, alendronate, azithromycin, buspar [buspirone], celebrex [celecoxib], cymbalta [duloxetine hcl], emgality  [galcanezumab -gnlm], estradiol, gabapentin , hydroxychloroquine, meloxicam,  pregabalin , tizanidine , topiramate , and amoxicillin.   Physical Exam: BP 116/70   Pulse 80    Pelvic Examination: Vagina: Incisions healing well. Sutures are present at the apex and there is not granulation tissue. No tenderness along the anterior or posterior vagina. No apical tenderness. No pelvic masses.   POP-Q: POP-Q  -3                                            Aa   -3                                           Ba  -9.5                                              C   2.5                                            Gh  3.5                                            Pb  10  tvl   -2.5                                            Ap  -2.5                                            Bp                                                 D    ---------------------------------------------------------  Assessment and Plan:  1. Post-operative state   2. SUI (stress urinary incontinence, female)     - Well healed.  - Can resume regular activity including exercise and intercourse,  if desired. Discussed avoidance of heavy lifting and straining long term to reduce the risk of recurrence.  - Prior UDS testing showed SUI. She passed her post op voiding trial and no longer has symptoms of urinary retention. For treatment of stress urinary incontinence,  discussed options of physical therapy, midurethral sling, and transurethral injection of a bulking agent. - She prefers an office procedure with urethral bulking (Bulkamid). We discussed success rate of approximately 70-80% and possible need for second injection. We reviewed that this is not a permanent procedure and the Bulkamid does dissolve over time. Risks reviewed including injury to bladder or urethra, UTI, urinary retention and hematuria.    Return for urethral bulking.  Arma Lamp, MD

## 2024-01-26 DIAGNOSIS — E559 Vitamin D deficiency, unspecified: Secondary | ICD-10-CM | POA: Diagnosis not present

## 2024-01-26 DIAGNOSIS — H5710 Ocular pain, unspecified eye: Secondary | ICD-10-CM | POA: Diagnosis not present

## 2024-01-26 DIAGNOSIS — I2510 Atherosclerotic heart disease of native coronary artery without angina pectoris: Secondary | ICD-10-CM | POA: Diagnosis not present

## 2024-01-26 DIAGNOSIS — E785 Hyperlipidemia, unspecified: Secondary | ICD-10-CM | POA: Diagnosis not present

## 2024-01-26 DIAGNOSIS — R202 Paresthesia of skin: Secondary | ICD-10-CM | POA: Diagnosis not present

## 2024-01-26 DIAGNOSIS — Z0000 Encounter for general adult medical examination without abnormal findings: Secondary | ICD-10-CM | POA: Diagnosis not present

## 2024-01-26 DIAGNOSIS — F325 Major depressive disorder, single episode, in full remission: Secondary | ICD-10-CM | POA: Diagnosis not present

## 2024-01-26 DIAGNOSIS — E039 Hypothyroidism, unspecified: Secondary | ICD-10-CM | POA: Diagnosis not present

## 2024-02-02 ENCOUNTER — Encounter: Payer: Self-pay | Admitting: Neurology

## 2024-02-07 ENCOUNTER — Encounter: Admitting: Obstetrics and Gynecology

## 2024-03-05 ENCOUNTER — Other Ambulatory Visit (HOSPITAL_COMMUNITY): Payer: Self-pay

## 2024-05-10 DIAGNOSIS — L6611 Classic lichen planopilaris: Secondary | ICD-10-CM | POA: Diagnosis not present

## 2024-05-10 DIAGNOSIS — D2271 Melanocytic nevi of right lower limb, including hip: Secondary | ICD-10-CM | POA: Diagnosis not present

## 2024-05-10 DIAGNOSIS — D692 Other nonthrombocytopenic purpura: Secondary | ICD-10-CM | POA: Diagnosis not present

## 2024-05-10 DIAGNOSIS — L821 Other seborrheic keratosis: Secondary | ICD-10-CM | POA: Diagnosis not present

## 2024-05-14 DIAGNOSIS — M79662 Pain in left lower leg: Secondary | ICD-10-CM | POA: Diagnosis not present

## 2024-05-14 DIAGNOSIS — I83893 Varicose veins of bilateral lower extremities with other complications: Secondary | ICD-10-CM | POA: Diagnosis not present

## 2024-05-14 DIAGNOSIS — M79661 Pain in right lower leg: Secondary | ICD-10-CM | POA: Diagnosis not present

## 2024-05-14 DIAGNOSIS — I87393 Chronic venous hypertension (idiopathic) with other complications of bilateral lower extremity: Secondary | ICD-10-CM | POA: Diagnosis not present

## 2024-05-14 DIAGNOSIS — M79604 Pain in right leg: Secondary | ICD-10-CM | POA: Diagnosis not present

## 2024-05-14 DIAGNOSIS — I872 Venous insufficiency (chronic) (peripheral): Secondary | ICD-10-CM | POA: Diagnosis not present

## 2024-05-22 ENCOUNTER — Telehealth: Payer: Self-pay | Admitting: *Deleted

## 2024-05-22 NOTE — Telephone Encounter (Signed)
 LM for pt to call back [facility] CMA [name] is called to provide pre-procedural instructions for her [] Cystoscopy, [] Bladder Botox or [x] Urethral Bulking. She is scheduled on 05/24/2024.  Asked pt to arrive at 1020am instead 1050 for procedure to start at 1030am per Dr Marilynne.  The remainder of pre-procedureal call needs to be finished.

## 2024-05-23 DIAGNOSIS — D23121 Other benign neoplasm of skin of left upper eyelid, including canthus: Secondary | ICD-10-CM | POA: Diagnosis not present

## 2024-05-23 MED ORDER — SULFAMETHOXAZOLE-TRIMETHOPRIM 800-160 MG PO TABS: ORAL_TABLET | ORAL | 0 refills | Status: AC

## 2024-05-23 NOTE — Telephone Encounter (Signed)
 [name] is called to provide pre-procedural instructions for her [] Cystoscopy, [] Bladder Botox or [] Urethral Bulking. She is scheduled on 05/24/2024.  The patient is not having worsening urinary symptoms to her urinary patterns. The patient is not experiencing any UTI. The patient is scheduled to not come in 2-3 days prior for a non-provider visit to run a POC Urinalysis.    [] The patient has been prescribed the pre-procedural antibiotics. The patient is reminded to take the prescribed antibiotics the morning of the procedure, an hour before and the morning of the following day.   [x] The patient has not previously been prescribed the pre-procedural anitbiotics, review with patient her medication allergies to determine the antibiotic prescription needed.  [] Since no allergies, a prescription for Bactrim  DS 800mg  #2, take 1 the morning of the procedure and 1 the morning after the procedure and one the morning after the procedure.   The patient is reminded to arrive 5 minutes prior to the scheduled appointment time and that a urine sample will need to be collected the upon arrival for the procedure.

## 2024-05-24 ENCOUNTER — Encounter: Payer: Self-pay | Admitting: Obstetrics and Gynecology

## 2024-05-24 ENCOUNTER — Ambulatory Visit (INDEPENDENT_AMBULATORY_CARE_PROVIDER_SITE_OTHER): Admitting: Obstetrics and Gynecology

## 2024-05-24 VITALS — BP 102/68 | HR 76

## 2024-05-24 DIAGNOSIS — N393 Stress incontinence (female) (male): Secondary | ICD-10-CM | POA: Diagnosis not present

## 2024-05-24 LAB — POCT URINALYSIS DIP (CLINITEK)
Bilirubin, UA: NEGATIVE
Blood, UA: NEGATIVE
Glucose, UA: NEGATIVE mg/dL
Ketones, POC UA: NEGATIVE mg/dL
Leukocytes, UA: NEGATIVE
Nitrite, UA: NEGATIVE
POC PROTEIN,UA: NEGATIVE
Spec Grav, UA: <=1.005 — AB (ref 1.010–1.025)
Urobilinogen, UA: 0.2 U/dL
pH, UA: 6 (ref 5.0–8.0)

## 2024-05-24 MED ORDER — LIDOCAINE HCL URETHRAL/MUCOSAL 2 % EX GEL
1.0000 | Freq: Once | CUTANEOUS | Status: AC
  Administered 2024-05-24: 1 via URETHRAL

## 2024-05-24 MED ORDER — LIDOCAINE-EPINEPHRINE 1 %-1:100000 IJ SOLN
6.0000 mL | Freq: Once | INTRAMUSCULAR | Status: AC
  Administered 2024-05-24: 6 mL

## 2024-05-24 NOTE — Addendum Note (Signed)
 Addended by: Sharlena Kristensen N on: 05/24/2024 03:17 PM   Modules accepted: Orders

## 2024-05-24 NOTE — Patient Instructions (Addendum)

## 2024-05-24 NOTE — Progress Notes (Signed)
 Bulkamid Injection  CC: 73 y.o. y.o. F with stress incontinence who presents for transurethral Bulkamid injection.  Patient signed her consent form.  She started antibiotic prophylaxis today.  Today's Vitals   05/24/24 1050  BP: 102/68  Pulse: 76   POC urine: negative  Procedure: Time out was performed. The bladder was catheterized and 10 ml of 2% lidocaine  jelly placed in the urethra. A urethral block was performed by injecting 3ml of 1% lidocaine  with epinephrine  at 3 and 9 o'clock adjacent to the urethra.  The needle was primed.  The cystoscope was inserted to the level of the bladder neck.  The needle was inserted 2 cm and the scope was pulled back into the urethra 2 cm.  The needle was inserted bevel up at the 5 o'clock position and the Bulkamid was injected to obtain coaptation.  This was repeated at the 2 o'clock,  10 o'clock and 7 o'clock positions.   A total of 2- 1ml syringes were used and good circumferential coaptation was noted.  The patient tolerated the procedure well. She was asked to void after the procedure.  Post-Void Residual (PVR) by Bladder Scan: In order to evaluate bladder emptying, we discussed obtaining a postvoid residual and she agreed to this procedure.  Procedure: The ultrasound unit was placed on the patient's abdomen in the suprapubic region after the patient had voided. A PVR of 570 ml was obtained by bladder scan.    ASSESSMENT: 73 y.o. y.o. s/p transurethral Bulkamid injection for stress incontinence.   PLAN: - 73 F catheter was placed. Patient will return to office tomorrow for voiding trial - After catheter removal, she will return for heavy bleeding, fevers, dysuria lasting beyond today and incomplete emptying.  All questions were answered.  Rosaline LOISE Caper, MD

## 2024-05-25 ENCOUNTER — Ambulatory Visit

## 2024-05-28 DIAGNOSIS — H02824 Cysts of left upper eyelid: Secondary | ICD-10-CM | POA: Diagnosis not present

## 2024-06-04 DIAGNOSIS — I83891 Varicose veins of right lower extremities with other complications: Secondary | ICD-10-CM | POA: Diagnosis not present

## 2024-06-05 DIAGNOSIS — Z1211 Encounter for screening for malignant neoplasm of colon: Secondary | ICD-10-CM | POA: Diagnosis not present

## 2024-06-07 DIAGNOSIS — I83892 Varicose veins of left lower extremities with other complications: Secondary | ICD-10-CM | POA: Diagnosis not present

## 2024-06-08 NOTE — Progress Notes (Addendum)
 [name] had Bulkamid procedure on 05/24/2024  She presents for a voiding trial.   Patient was identified with 2 identifiers.  The patient states she does not have any concerns with the foley placed.  220 mL of NS was instilled into the bladder via a catheter.  The catheter was removed and patient was instructed to void into the urinary hat.  She voided 200 mL.  She did pass the voiding trial.  The patient was not sent home with a catheter.    The patient received aftercare instructions and will follow up as scheduled.

## 2024-06-08 NOTE — Patient Instructions (Signed)
  Please drink lots of water and try to expel as much urine as you are inputting. If you are unable to urinate, give the office a call before 2 pm.     Please keep all future appointments and if you have any questions or concerns please feel free to contact our office at [phone].

## 2024-06-13 DIAGNOSIS — L0232 Furuncle of buttock: Secondary | ICD-10-CM | POA: Diagnosis not present

## 2024-06-28 ENCOUNTER — Ambulatory Visit: Admitting: Obstetrics and Gynecology

## 2024-07-06 DIAGNOSIS — K5730 Diverticulosis of large intestine without perforation or abscess without bleeding: Secondary | ICD-10-CM | POA: Diagnosis not present

## 2024-07-06 DIAGNOSIS — Z1211 Encounter for screening for malignant neoplasm of colon: Secondary | ICD-10-CM | POA: Diagnosis not present

## 2024-08-06 DIAGNOSIS — I872 Venous insufficiency (chronic) (peripheral): Secondary | ICD-10-CM | POA: Diagnosis not present

## 2024-08-06 DIAGNOSIS — R600 Localized edema: Secondary | ICD-10-CM | POA: Diagnosis not present

## 2024-08-06 DIAGNOSIS — L299 Pruritus, unspecified: Secondary | ICD-10-CM | POA: Diagnosis not present

## 2024-08-09 DIAGNOSIS — K080 Exfoliation of teeth due to systemic causes: Secondary | ICD-10-CM | POA: Diagnosis not present

## 2024-09-17 ENCOUNTER — Encounter: Payer: Self-pay | Admitting: *Deleted
